# Patient Record
Sex: Female | Born: 1950
Health system: Southern US, Community
[De-identification: ages and names within clinical notes are randomized; demographics above are authoritative.]

## PROBLEM LIST (undated history)

## (undated) DIAGNOSIS — F419 Anxiety disorder, unspecified: Secondary | ICD-10-CM

## (undated) DIAGNOSIS — T4145XA Adverse effect of unspecified anesthetic, initial encounter: Secondary | ICD-10-CM

## (undated) DIAGNOSIS — K5792 Diverticulitis of intestine, part unspecified, without perforation or abscess without bleeding: Secondary | ICD-10-CM

## (undated) DIAGNOSIS — F32A Depression, unspecified: Secondary | ICD-10-CM

## (undated) DIAGNOSIS — I10 Essential (primary) hypertension: Secondary | ICD-10-CM

## (undated) DIAGNOSIS — F909 Attention-deficit hyperactivity disorder, unspecified type: Secondary | ICD-10-CM

## (undated) DIAGNOSIS — D649 Anemia, unspecified: Secondary | ICD-10-CM

## (undated) DIAGNOSIS — T8859XA Other complications of anesthesia, initial encounter: Secondary | ICD-10-CM

## (undated) DIAGNOSIS — R519 Headache, unspecified: Secondary | ICD-10-CM

## (undated) DIAGNOSIS — K839 Disease of biliary tract, unspecified: Secondary | ICD-10-CM

## (undated) DIAGNOSIS — E039 Hypothyroidism, unspecified: Secondary | ICD-10-CM

## (undated) DIAGNOSIS — M797 Fibromyalgia: Secondary | ICD-10-CM

## (undated) DIAGNOSIS — Z87442 Personal history of urinary calculi: Secondary | ICD-10-CM

## (undated) DIAGNOSIS — M1990 Unspecified osteoarthritis, unspecified site: Secondary | ICD-10-CM

## (undated) HISTORY — DX: Diverticulitis of intestine, part unspecified, without perforation or abscess without bleeding: K57.92

## (undated) HISTORY — PX: ABDOMINAL HYSTERECTOMY: SHX81

## (undated) HISTORY — PX: TONSILLECTOMY: SUR1361

## (undated) HISTORY — PX: MYOMECTOMY: SHX85

## (undated) HISTORY — PX: OTHER SURGICAL HISTORY: SHX169

## (undated) HISTORY — PX: APPENDECTOMY: SHX54

## (undated) HISTORY — PX: COLONOSCOPY: SHX174

## (undated) HISTORY — PX: CATARACT EXTRACTION W/ INTRAOCULAR LENS IMPLANT: SHX1309

---

## 1998-09-19 ENCOUNTER — Other Ambulatory Visit: Admission: RE | Admit: 1998-09-19 | Discharge: 1998-09-19 | Payer: Self-pay | Admitting: Obstetrics and Gynecology

## 1998-10-10 ENCOUNTER — Ambulatory Visit (HOSPITAL_COMMUNITY): Admission: RE | Admit: 1998-10-10 | Discharge: 1998-10-10 | Payer: Self-pay | Admitting: Obstetrics and Gynecology

## 1998-12-17 HISTORY — PX: ABDOMINAL HYSTERECTOMY: SHX81

## 1999-08-16 ENCOUNTER — Encounter (INDEPENDENT_AMBULATORY_CARE_PROVIDER_SITE_OTHER): Payer: Self-pay | Admitting: Specialist

## 1999-08-16 ENCOUNTER — Inpatient Hospital Stay (HOSPITAL_COMMUNITY): Admission: RE | Admit: 1999-08-16 | Discharge: 1999-08-18 | Payer: Self-pay | Admitting: Obstetrics and Gynecology

## 1999-12-18 HISTORY — PX: LUMBAR FUSION: SHX111

## 2000-07-04 ENCOUNTER — Other Ambulatory Visit: Admission: RE | Admit: 2000-07-04 | Discharge: 2000-07-04 | Payer: Self-pay | Admitting: Obstetrics and Gynecology

## 2001-07-08 ENCOUNTER — Other Ambulatory Visit: Admission: RE | Admit: 2001-07-08 | Discharge: 2001-07-08 | Payer: Self-pay | Admitting: Obstetrics and Gynecology

## 2003-03-09 ENCOUNTER — Other Ambulatory Visit: Admission: RE | Admit: 2003-03-09 | Discharge: 2003-03-09 | Payer: Self-pay | Admitting: Obstetrics and Gynecology

## 2006-03-27 ENCOUNTER — Ambulatory Visit: Payer: Self-pay

## 2006-04-10 ENCOUNTER — Ambulatory Visit: Payer: Self-pay

## 2006-05-22 ENCOUNTER — Ambulatory Visit: Payer: Self-pay

## 2007-01-22 ENCOUNTER — Ambulatory Visit: Payer: Self-pay

## 2007-02-05 ENCOUNTER — Ambulatory Visit: Payer: Self-pay

## 2007-03-11 ENCOUNTER — Ambulatory Visit: Payer: Self-pay

## 2007-03-18 ENCOUNTER — Ambulatory Visit: Payer: Self-pay

## 2011-06-21 ENCOUNTER — Encounter: Payer: Self-pay | Admitting: Family Medicine

## 2012-02-13 ENCOUNTER — Encounter (HOSPITAL_COMMUNITY): Payer: Self-pay | Admitting: Emergency Medicine

## 2012-02-13 ENCOUNTER — Emergency Department (HOSPITAL_COMMUNITY)

## 2012-02-13 ENCOUNTER — Emergency Department (HOSPITAL_COMMUNITY)
Admission: EM | Admit: 2012-02-13 | Discharge: 2012-02-13 | Disposition: A | Discharge status: Home / Self Care | Attending: Emergency Medicine | Admitting: Emergency Medicine

## 2012-02-13 ENCOUNTER — Other Ambulatory Visit: Payer: Self-pay | Admitting: Internal Medicine

## 2012-02-13 ENCOUNTER — Other Ambulatory Visit: Payer: Self-pay

## 2012-02-13 DIAGNOSIS — R7989 Other specified abnormal findings of blood chemistry: Secondary | ICD-10-CM | POA: Insufficient documentation

## 2012-02-13 DIAGNOSIS — F17200 Nicotine dependence, unspecified, uncomplicated: Secondary | ICD-10-CM | POA: Insufficient documentation

## 2012-02-13 DIAGNOSIS — N390 Urinary tract infection, site not specified: Secondary | ICD-10-CM

## 2012-02-13 DIAGNOSIS — R110 Nausea: Secondary | ICD-10-CM

## 2012-02-13 DIAGNOSIS — R197 Diarrhea, unspecified: Secondary | ICD-10-CM | POA: Insufficient documentation

## 2012-02-13 DIAGNOSIS — R109 Unspecified abdominal pain: Secondary | ICD-10-CM | POA: Insufficient documentation

## 2012-02-13 DIAGNOSIS — I10 Essential (primary) hypertension: Secondary | ICD-10-CM | POA: Insufficient documentation

## 2012-02-13 DIAGNOSIS — R112 Nausea with vomiting, unspecified: Secondary | ICD-10-CM | POA: Insufficient documentation

## 2012-02-13 HISTORY — DX: Essential (primary) hypertension: I10

## 2012-02-13 LAB — URINALYSIS, ROUTINE W REFLEX MICROSCOPIC
Bilirubin Urine: NEGATIVE
Glucose, UA: 100 mg/dL — AB
Ketones, ur: NEGATIVE mg/dL
Leukocytes, UA: NEGATIVE
Nitrite: NEGATIVE
Protein, ur: 100 mg/dL — AB
Specific Gravity, Urine: 1.016 (ref 1.005–1.030)
Urobilinogen, UA: 0.2 mg/dL (ref 0.0–1.0)
pH: 7 (ref 5.0–8.0)

## 2012-02-13 LAB — COMPREHENSIVE METABOLIC PANEL
ALT: 42 U/L — ABNORMAL HIGH (ref 0–35)
AST: 26 U/L (ref 0–37)
Albumin: 4.2 g/dL (ref 3.5–5.2)
Alkaline Phosphatase: 55 U/L (ref 39–117)
BUN: 11 mg/dL (ref 6–23)
CO2: 21 mEq/L (ref 19–32)
Calcium: 10.5 mg/dL (ref 8.4–10.5)
Chloride: 95 mEq/L — ABNORMAL LOW (ref 96–112)
Creatinine, Ser: 0.55 mg/dL (ref 0.50–1.10)
GFR calc Af Amer: >90 mL/min (ref >90)
GFR calc non Af Amer: >90 mL/min (ref >90)
Glucose, Bld: 162 mg/dL — ABNORMAL HIGH (ref 70–99)
Potassium: 3.9 mEq/L (ref 3.5–5.1)
Sodium: 135 mEq/L (ref 135–145)
Total Bilirubin: 0.4 mg/dL (ref 0.3–1.2)
Total Protein: 7.9 g/dL (ref 6.0–8.3)

## 2012-02-13 LAB — CBC
HCT: 45 % (ref 36.0–46.0)
Hemoglobin: 15.9 g/dL — ABNORMAL HIGH (ref 12.0–15.0)
MCH: 31.4 pg (ref 26.0–34.0)
MCHC: 35.3 g/dL (ref 30.0–36.0)
MCV: 88.9 fL (ref 78.0–100.0)
Platelets: 128 10*3/uL — ABNORMAL LOW (ref 150–400)
RBC: 5.06 MIL/uL (ref 3.87–5.11)
RDW: 12.8 % (ref 11.5–15.5)
WBC: 10.6 10*3/uL — ABNORMAL HIGH (ref 4.0–10.5)

## 2012-02-13 LAB — LIPASE, BLOOD: Lipase: 22 U/L (ref 11–59)

## 2012-02-13 LAB — DIFFERENTIAL
Basophils Absolute: 0 10*3/uL (ref 0.0–0.1)
Basophils Relative: 0 % (ref 0–1)
Eosinophils Absolute: 0 10*3/uL (ref 0.0–0.7)
Eosinophils Relative: 0 % (ref 0–5)
Lymphocytes Relative: 13 % (ref 12–46)
Lymphs Abs: 1.4 10*3/uL (ref 0.7–4.0)
Monocytes Absolute: 0.3 10*3/uL (ref 0.1–1.0)
Monocytes Relative: 3 % (ref 3–12)
Neutro Abs: 8.9 10*3/uL — ABNORMAL HIGH (ref 1.7–7.7)
Neutrophils Relative %: 84 % — ABNORMAL HIGH (ref 43–77)

## 2012-02-13 LAB — URINE MICROSCOPIC-ADD ON

## 2012-02-13 MED ORDER — HYDROMORPHONE HCL PF 1 MG/ML IJ SOLN
1.0000 mg | Freq: Once | INTRAMUSCULAR | Status: AC
  Administered 2012-02-13: 1 mg via INTRAVENOUS
  Filled 2012-02-13: qty 1

## 2012-02-13 MED ORDER — HYDROCODONE-ACETAMINOPHEN 5-500 MG PO TABS: 1.0000 | ORAL_TABLET | Freq: Four times a day (QID) | ORAL | Status: AC | PRN

## 2012-02-13 MED ORDER — CEPHALEXIN 500 MG PO CAPS: 1000.0000 mg | ORAL_CAPSULE | Freq: Two times a day (BID) | ORAL | Status: AC

## 2012-02-13 MED ORDER — HYDROMORPHONE HCL PF 1 MG/ML IJ SOLN
0.5000 mg | Freq: Once | INTRAMUSCULAR | Status: AC
  Administered 2012-02-13: 0.5 mg via INTRAVENOUS
  Filled 2012-02-13: qty 1

## 2012-02-13 MED ORDER — ONDANSETRON 8 MG PO TBDP: 8.0000 mg | ORAL_TABLET | Freq: Three times a day (TID) | ORAL | Status: AC | PRN

## 2012-02-13 MED ORDER — ONDANSETRON HCL 4 MG/2ML IJ SOLN
4.0000 mg | Freq: Once | INTRAMUSCULAR | Status: AC
  Administered 2012-02-13: 4 mg via INTRAVENOUS
  Filled 2012-02-13: qty 2

## 2012-02-13 MED ORDER — SODIUM CHLORIDE 0.9 % IV SOLN
Freq: Once | INTRAVENOUS | Status: AC
  Administered 2012-02-13: 17:00:00 via INTRAVENOUS

## 2012-02-13 NOTE — ED Notes (Signed)
Pt to u/s via wheelchair with u/s tech

## 2012-02-13 NOTE — ED Notes (Addendum)
Pt c/o epigastric pain since 7am. Pt also reports one episode of nausea and diarrhea. Pt was seen at PCP office and was told to come here to have u/s of gallbladder.

## 2012-02-13 NOTE — ED Provider Notes (Signed)
History     CSN: [id number]  Arrival date & time 02/13/12  1619   First MD Initiated Contact with Patient 02/13/12 1640      Chief Complaint  Patient presents with  . Abdominal Pain    (Consider location/radiation/quality/duration/timing/severity/associated sxs/prior treatment) Patient is a 61 y.o. female presenting with abdominal pain. The history is provided by the patient.  Abdominal Pain The primary symptoms of the illness include abdominal pain, nausea, vomiting and diarrhea. The primary symptoms of the illness do not include fever, shortness of breath, hematemesis, dysuria, vaginal discharge or vaginal bleeding. The current episode started 6 to 12 hours ago. The onset of the illness was sudden. The problem has been gradually worsening.  Additional symptoms associated with the illness include anorexia. Symptoms associated with the illness do not include chills or hematuria.  Pt states she woke up this morning with upper abdominal pain, nausea, vomiting, diarrhea. States tried pepto, pepcid, other over the counter products, however, was not getting better. Went to see her PCP. Got blood drawn and [place] scheduled for tomorrow. Pt states she cannot wait until tomorrow, so came straight here. Pt states pain is worsening, right now mainly in the right upper abdomen radiating into the back. No similar episodes in the past. Denies fever, chills, chest pain, SOB, urinary symptoms.   Past Medical History  Diagnosis Date  . Hypertension     Past Surgical History  Procedure Date  . Abdominal hysterectomy     History reviewed. No pertinent family history.  History  Substance Use Topics  . Smoking status: Current Everyday Smoker -- 0.5 packs/day  . Smokeless tobacco: Not on file  . Alcohol Use: No    OB History    Grav Para Term Preterm Abortions TAB SAB Ect Mult Living                  Review of Systems  Constitutional: Negative for fever and chills.  HENT: Negative.   Eyes:  Negative.   Respiratory: Negative for cough, chest tightness and shortness of breath.   Cardiovascular: Negative.   Gastrointestinal: Positive for nausea, vomiting, abdominal pain, diarrhea and anorexia. Negative for hematemesis.  Genitourinary: Negative for dysuria, hematuria, flank pain, vaginal bleeding, vaginal discharge and pelvic pain.  Musculoskeletal: Negative.   Skin: Negative.   Neurological: Negative.   Psychiatric/Behavioral: Negative.     Allergies  Review of patient's allergies indicates no known allergies.  Home Medications  No current outpatient prescriptions on file.  BP 193/111  Pulse 85  Temp(Src) 97.8 F (36.6 C) (Oral)  Resp 20  Ht 5\' 2"  (1.575 m)  Wt 193 lb (87.544 kg)  BMI 35.30 kg/m2  SpO2 100%  Physical Exam  Nursing note and vitals reviewed. Constitutional: She is oriented to person, place, and time. She appears well-developed and well-nourished.       Uncomfortable appearing.  HENT:  Head: Normocephalic and atraumatic.  Eyes: Conjunctivae are normal.  Neck: Neck supple.  Cardiovascular: Normal rate, regular rhythm and normal heart sounds.   Pulmonary/Chest: Effort normal and breath sounds normal. No respiratory distress.  Abdominal: Soft. Bowel sounds are normal. There is no rebound.       RUQ tenderness, guarding. Epigastric tenderness.  Musculoskeletal: She exhibits no edema.  Neurological: She is alert and oriented to person, place, and time.  Skin: Skin is warm and dry.  Psychiatric: She has a normal mood and affect.    ED Course  Procedures (including critical care time)  Date: 02/13/2012  Rate: 79  Rhythm: normal sinus rhythm  QRS Axis: normal  Intervals: normal  ST/T Wave abnormalities: nonspecific T wave changes  Conduction Disutrbances:none  Narrative Interpretation:   Old EKG Reviewed: unchanged compared to 08/11/99.  Pt with severe RUQ abdominal pain. Medications ordered. Labs ordered. [place] ordered to r/o cholecystitis.    Results for orders placed during the hospital encounter of 02/13/12  CBC      Component Value Range   WBC 10.6 (*) 4.0 - 10.5 (K/uL)   RBC 5.06  3.87 - 5.11 (MIL/uL)   Hemoglobin 15.9 (*) 12.0 - 15.0 (g/dL)   HCT 16.1  09.6 - 04.5 (%)   MCV 88.9  78.0 - 100.0 (fL)   MCH 31.4  26.0 - 34.0 (pg)   MCHC 35.3  30.0 - 36.0 (g/dL)   RDW 40.9  81.1 - 91.4 (%)   Platelets 128 (*) 150 - 400 (K/uL)  DIFFERENTIAL      Component Value Range   Neutrophils Relative 84 (*) 43 - 77 (%)   Lymphocytes Relative 13  12 - 46 (%)   Monocytes Relative 3  3 - 12 (%)   Eosinophils Relative 0  0 - 5 (%)   Basophils Relative 0  0 - 1 (%)   Neutro Abs 8.9 (*) 1.7 - 7.7 (K/uL)   Lymphs Abs 1.4  0.7 - 4.0 (K/uL)   Monocytes Absolute 0.3  0.1 - 1.0 (K/uL)   Eosinophils Absolute 0.0  0.0 - 0.7 (K/uL)   Basophils Absolute 0.0  0.0 - 0.1 (K/uL)   WBC Morphology ATYPICAL LYMPHOCYTES     Smear Review PLATELET COUNT CONFIRMED BY SMEAR    COMPREHENSIVE METABOLIC PANEL      Component Value Range   Sodium 135  135 - 145 (mEq/L)   Potassium 3.9  3.5 - 5.1 (mEq/L)   Chloride 95 (*) 96 - 112 (mEq/L)   CO2 21  19 - 32 (mEq/L)   Glucose, Bld 162 (*) 70 - 99 (mg/dL)   BUN 11  6 - 23 (mg/dL)   Creatinine, Ser 7.82  0.50 - 1.10 (mg/dL)   Calcium 95.6  8.4 - 10.5 (mg/dL)   Total Protein 7.9  6.0 - 8.3 (g/dL)   Albumin 4.2  3.5 - 5.2 (g/dL)   AST 26  0 - 37 (U/L)   ALT 42 (*) 0 - 35 (U/L)   Alkaline Phosphatase 55  39 - 117 (U/L)   Total Bilirubin 0.4  0.3 - 1.2 (mg/dL)   GFR calc non Af Amer >90  >90 (mL/min)   GFR calc Af Amer >90  >90 (mL/min)  LIPASE, BLOOD      Component Value Range   Lipase 22  11 - 59 (U/L)  URINALYSIS, ROUTINE W REFLEX MICROSCOPIC      Component Value Range   Color, Urine YELLOW  YELLOW    APPearance CLOUDY (*) CLEAR    Specific Gravity, Urine 1.016  1.005 - 1.030    pH 7.0  5.0 - 8.0    Glucose, UA 100 (*) NEGATIVE (mg/dL)   Hgb urine dipstick LARGE (*) NEGATIVE    Bilirubin Urine  NEGATIVE  NEGATIVE    Ketones, ur NEGATIVE  NEGATIVE (mg/dL)   Protein, ur 213 (*) NEGATIVE (mg/dL)   Urobilinogen, UA 0.2  0.0 - 1.0 (mg/dL)   Nitrite NEGATIVE  NEGATIVE    Leukocytes, UA NEGATIVE  NEGATIVE   URINE MICROSCOPIC-ADD ON      Component Value  Range   RBC / HPF 21-50  <3 (RBC/hpf)   Bacteria, UA MANY (*) RARE    US Abdomen Complete  02/13/2012  *RADIOLOGY REPORT*  Clinical Data:  Elevated liver function tests, abdominal pain and nausea.  ABDOMEN ULTRASOUND  Technique:  Complete abdominal ultrasound examination was performed including evaluation of the liver, gallbladder, bile ducts, pancreas, kidneys, spleen, IVC, and abdominal aorta.  Comparison:  None  Findings:  Gallbladder:  No shadowing gallstones or echogenic sludge.  No gallbladder wall thickening or pericholecystic fluid.  Negative sonographic Murphy's sign according to the ultrasound technologist.  Common Bile Duct:  Normal caliber of 2 mm.  Liver:  The liver shows diffusely increased echogenicity likely reflecting steatosis.  There is no evidence of obvious focal mass or biliary dilatation by ultrasound.  IVC:  Patent throughout its visualized course in the abdomen.  Pancreas:  Although the pancreas is difficult to visualize in its entirety, no focal pancreatic abnormality is identified.  Spleen:  Normal echotexture and size.  Kidneys:  The right kidney measures 11.1 cm and the left kidney 11.9 cm.  Both have a normal sonographic appearance.  There is no evidence of renal obstruction or lesion.  Abdominal Aorta:  No evidence of aortic aneurysm.  IMPRESSION: Diffuse increased echogenicity of the liver likely reflecting steatosis.  Original Report Authenticated By: Reola Calkins, M.D.    [place] negative. Pt feeling better. Pain free. No nausea/vomiting in ED. Abdomen soft. Will d/c home with pain meds and close follow up. Pt does have a UTI, however having no CVA tenderness. Will start on keflex. Results and plan reviewed with her  and her husband. She agrees with the plan. Will call her PCP in AM, return if worsening.    No diagnosis found.    MDM          Lottie Mussel, PA 02/14/12 0030

## 2012-02-14 ENCOUNTER — Other Ambulatory Visit: Payer: Self-pay

## 2012-02-14 NOTE — ED Provider Notes (Signed)
Medical screening examination/treatment/procedure(s) were performed by non-physician practitioner and as supervising physician I was immediately available for consultation/collaboration.    Betzayda Braxton L Alpa Salvo, MD 02/14/12 1633 

## 2012-02-15 LAB — URINE CULTURE
Colony Count: 35000
Culture  Setup Time: 201302280516

## 2012-02-27 ENCOUNTER — Ambulatory Visit (INDEPENDENT_AMBULATORY_CARE_PROVIDER_SITE_OTHER): Payer: Self-pay

## 2012-02-27 DIAGNOSIS — F4320 Adjustment disorder, unspecified: Secondary | ICD-10-CM

## 2012-03-05 ENCOUNTER — Ambulatory Visit (INDEPENDENT_AMBULATORY_CARE_PROVIDER_SITE_OTHER): Payer: Self-pay

## 2012-03-05 DIAGNOSIS — F4320 Adjustment disorder, unspecified: Secondary | ICD-10-CM

## 2012-03-12 ENCOUNTER — Ambulatory Visit (INDEPENDENT_AMBULATORY_CARE_PROVIDER_SITE_OTHER): Payer: Self-pay

## 2012-03-12 DIAGNOSIS — F4320 Adjustment disorder, unspecified: Secondary | ICD-10-CM

## 2012-03-26 ENCOUNTER — Ambulatory Visit (INDEPENDENT_AMBULATORY_CARE_PROVIDER_SITE_OTHER): Payer: Self-pay

## 2012-03-26 DIAGNOSIS — F4320 Adjustment disorder, unspecified: Secondary | ICD-10-CM

## 2012-04-02 ENCOUNTER — Ambulatory Visit (INDEPENDENT_AMBULATORY_CARE_PROVIDER_SITE_OTHER): Payer: Self-pay

## 2012-04-02 DIAGNOSIS — F4320 Adjustment disorder, unspecified: Secondary | ICD-10-CM

## 2012-04-03 ENCOUNTER — Encounter (HOSPITAL_COMMUNITY): Payer: Self-pay | Admitting: Emergency Medicine

## 2012-04-03 ENCOUNTER — Emergency Department (HOSPITAL_COMMUNITY)

## 2012-04-03 ENCOUNTER — Emergency Department (HOSPITAL_COMMUNITY)
Admission: EM | Admit: 2012-04-03 | Discharge: 2012-04-04 | Disposition: A | Discharge status: Home / Self Care | Attending: Emergency Medicine | Admitting: Emergency Medicine

## 2012-04-03 ENCOUNTER — Other Ambulatory Visit: Payer: Self-pay

## 2012-04-03 DIAGNOSIS — I10 Essential (primary) hypertension: Secondary | ICD-10-CM | POA: Insufficient documentation

## 2012-04-03 DIAGNOSIS — I776 Arteritis, unspecified: Secondary | ICD-10-CM

## 2012-04-03 DIAGNOSIS — R142 Eructation: Secondary | ICD-10-CM | POA: Insufficient documentation

## 2012-04-03 DIAGNOSIS — R109 Unspecified abdominal pain: Secondary | ICD-10-CM | POA: Insufficient documentation

## 2012-04-03 DIAGNOSIS — M549 Dorsalgia, unspecified: Secondary | ICD-10-CM | POA: Insufficient documentation

## 2012-04-03 DIAGNOSIS — Z79899 Other long term (current) drug therapy: Secondary | ICD-10-CM | POA: Insufficient documentation

## 2012-04-03 DIAGNOSIS — R141 Gas pain: Secondary | ICD-10-CM | POA: Insufficient documentation

## 2012-04-03 DIAGNOSIS — R112 Nausea with vomiting, unspecified: Secondary | ICD-10-CM | POA: Insufficient documentation

## 2012-04-03 HISTORY — DX: Anxiety disorder, unspecified: F41.9

## 2012-04-03 LAB — COMPREHENSIVE METABOLIC PANEL
ALT: 33 U/L (ref 0–35)
AST: 24 U/L (ref 0–37)
Albumin: 4.3 g/dL (ref 3.5–5.2)
Alkaline Phosphatase: 60 U/L (ref 39–117)
BUN: 15 mg/dL (ref 6–23)
CO2: 23 mEq/L (ref 19–32)
Calcium: 9.9 mg/dL (ref 8.4–10.5)
Chloride: 101 mEq/L (ref 96–112)
Creatinine, Ser: 0.69 mg/dL (ref 0.50–1.10)
GFR calc Af Amer: >90 mL/min (ref >90)
GFR calc non Af Amer: >90 mL/min (ref >90)
Glucose, Bld: 157 mg/dL — ABNORMAL HIGH (ref 70–99)
Potassium: 3.9 mEq/L (ref 3.5–5.1)
Sodium: 139 mEq/L (ref 135–145)
Total Bilirubin: 0.3 mg/dL (ref 0.3–1.2)
Total Protein: 8.1 g/dL (ref 6.0–8.3)

## 2012-04-03 LAB — DIFFERENTIAL
Basophils Absolute: 0 10*3/uL (ref 0.0–0.1)
Basophils Relative: 0 % (ref 0–1)
Eosinophils Absolute: 0 10*3/uL (ref 0.0–0.7)
Eosinophils Relative: 0 % (ref 0–5)
Lymphocytes Relative: 11 % — ABNORMAL LOW (ref 12–46)
Lymphs Abs: 1.4 10*3/uL (ref 0.7–4.0)
Monocytes Absolute: 0.5 10*3/uL (ref 0.1–1.0)
Monocytes Relative: 4 % (ref 3–12)
Neutro Abs: 11 10*3/uL — ABNORMAL HIGH (ref 1.7–7.7)
Neutrophils Relative %: 85 % — ABNORMAL HIGH (ref 43–77)

## 2012-04-03 LAB — CBC
HCT: 43.5 % (ref 36.0–46.0)
Hemoglobin: 15.3 g/dL — ABNORMAL HIGH (ref 12.0–15.0)
MCH: 31.6 pg (ref 26.0–34.0)
MCHC: 35.2 g/dL (ref 30.0–36.0)
MCV: 89.9 fL (ref 78.0–100.0)
Platelets: 288 10*3/uL (ref 150–400)
RBC: 4.84 MIL/uL (ref 3.87–5.11)
RDW: 12.8 % (ref 11.5–15.5)
WBC: 13 10*3/uL — ABNORMAL HIGH (ref 4.0–10.5)

## 2012-04-03 LAB — LIPASE, BLOOD: Lipase: 30 U/L (ref 11–59)

## 2012-04-03 MED ORDER — PROMETHAZINE HCL 25 MG/ML IJ SOLN
12.5000 mg | INTRAMUSCULAR | Status: AC
Start: 2012-04-03 — End: 2012-04-03
  Administered 2012-04-03: 12.5 mg via INTRAVENOUS
  Filled 2012-04-03: qty 1

## 2012-04-03 MED ORDER — IOHEXOL 300 MG/ML  SOLN
100.0000 mL | Freq: Once | INTRAMUSCULAR | Status: AC | PRN
  Administered 2012-04-03: 100 mL via INTRAVENOUS

## 2012-04-03 MED ORDER — ONDANSETRON HCL 4 MG/2ML IJ SOLN
4.0000 mg | Freq: Once | INTRAMUSCULAR | Status: AC
  Administered 2012-04-03: 4 mg via INTRAVENOUS
  Filled 2012-04-03: qty 2

## 2012-04-03 MED ORDER — SODIUM CHLORIDE 0.9 % IV SOLN
Freq: Once | INTRAVENOUS | Status: AC
  Administered 2012-04-03: 21:00:00 via INTRAVENOUS

## 2012-04-03 MED ORDER — OXYCODONE-ACETAMINOPHEN 5-325 MG PO TABS: 1.0000 | ORAL_TABLET | ORAL | Status: AC | PRN

## 2012-04-03 MED ORDER — ONDANSETRON 8 MG PO TBDP: 8.0000 mg | ORAL_TABLET | Freq: Three times a day (TID) | ORAL | Status: AC | PRN

## 2012-04-03 MED ORDER — HYOSCYAMINE SULFATE 0.125 MG SL SUBL: 0.1250 mg | SUBLINGUAL_TABLET | SUBLINGUAL | Status: DC | PRN

## 2012-04-03 MED ORDER — HYDROMORPHONE HCL PF 2 MG/ML IJ SOLN
2.0000 mg | Freq: Once | INTRAMUSCULAR | Status: AC
  Administered 2012-04-03: 2 mg via INTRAVENOUS
  Filled 2012-04-03: qty 1

## 2012-04-03 MED ORDER — HYDROMORPHONE HCL PF 1 MG/ML IJ SOLN
0.5000 mg | Freq: Once | INTRAMUSCULAR | Status: AC
  Administered 2012-04-03: 0.5 mg via INTRAVENOUS
  Filled 2012-04-03: qty 1

## 2012-04-03 MED ORDER — HYOSCYAMINE SULFATE 0.5 MG/ML IJ SOLN
0.1250 mg | INTRAMUSCULAR | Status: AC
  Administered 2012-04-03: 0.125 mg via INTRAVENOUS
  Filled 2012-04-03: qty 0.25

## 2012-04-03 MED ORDER — HYDROMORPHONE HCL PF 1 MG/ML IJ SOLN
1.0000 mg | Freq: Once | INTRAMUSCULAR | Status: AC
  Administered 2012-04-03: 1 mg via INTRAVENOUS
  Filled 2012-04-03: qty 1

## 2012-04-03 NOTE — ED Provider Notes (Signed)
Medical screening examination/treatment/procedure(s) were conducted as a shared visit with non-physician practitioner(s) and myself.  I personally evaluated the patient during the encounter Pt had eaten lunch with clients.  When she was driving home,  She became light headed, developed abd pain and felt as if she would faint.  Now has had epigastric pain that goes  To back along with n/v/diarrhea.  Pain severe. No recent etoh or hx of pud.   No fever, resp sxs or uti sxs.  pe in pain,  Epigastric and ruq , luq ttp. No rigidity.  Will give analgesics and do labs and ct.  Has hx neg [place] for gall stones.   ED ECG REPORT     ECG ECG time 2122 Heart rate 58 Sinus bradycardia Normal axis Normal intervals, normal ST-T wave Interpretation sinus bradycardia        Cheri Guppy, MD 04/03/12 2203

## 2012-04-03 NOTE — ED Notes (Signed)
Lab tech at bedside

## 2012-04-03 NOTE — ED Notes (Signed)
MD at bedside. Dr. Caporossi at bedside.  

## 2012-04-03 NOTE — ED Notes (Signed)
Patient returned from CT

## 2012-04-03 NOTE — ED Notes (Signed)
PA Narvaez at bedside.  

## 2012-04-03 NOTE — ED Provider Notes (Signed)
History     CSN: [id number]  Arrival date & time 04/03/12  2010   First MD Initiated Contact with Patient 04/03/12 2025      Chief Complaint  Patient presents with  . Abdominal Pain    (Consider location/radiation/quality/duration/timing/severity/associated sxs/prior treatment) Patient is a 61 y.o. female presenting with abdominal pain. The history is provided by the patient and the spouse.  Abdominal Pain The primary symptoms of the illness include abdominal pain, nausea and vomiting. The primary symptoms of the illness do not include fever. The current episode started 3 to 5 hours ago. The onset of the illness was sudden. The problem has not changed since onset. The abdominal pain is located in the LUQ, RUQ and periumbilical region. The abdominal pain radiates to the back. The severity of the abdominal pain is 10/10.  Additional symptoms associated with the illness include back pain. Symptoms associated with the illness do not include chills. Associated symptoms comments: She was seen in ED on 02/13/12 for similar but less intense symptoms. She reports a negative ultrasound and work up, and symptom resolution with pain medications given in ED. She has been pain free since that time..    Past Medical History  Diagnosis Date  . Hypertension   . Anxiety     Past Surgical History  Procedure Date  . Abdominal hysterectomy     History reviewed. No pertinent family history.  History  Substance Use Topics  . Smoking status: Current Everyday Smoker -- 0.5 packs/day  . Smokeless tobacco: Not on file  . Alcohol Use: No    OB History    Grav Para Term Preterm Abortions TAB SAB Ect Mult Living                  Review of Systems  Constitutional: Negative for fever and chills.  HENT: Negative.   Respiratory: Negative.   Cardiovascular: Negative.   Gastrointestinal: Positive for nausea, vomiting and abdominal pain.  Musculoskeletal: Positive for back pain.  Skin: Negative.     Neurological: Negative.     Allergies  Review of patient's allergies indicates no known allergies.  Home Medications   Current Outpatient Rx  Name Route Sig Dispense Refill  . ALPRAZOLAM 1 MG PO TABS Oral Take by mouth See admin instructions. Take 1/2 to 1 tablet twice dailly as needed for anxiety.    . ESCITALOPRAM OXALATE 10 MG PO TABS Oral Take 10 mg by mouth daily.    Marland Kitchen LISINOPRIL 20 MG PO TABS Oral Take 20 mg by mouth daily.    Marland Kitchen ZOLPIDEM TARTRATE 10 MG PO TABS Oral Take 10 mg by mouth at bedtime as needed. Sleep.      BP 216/98  Pulse 66  Temp(Src) 97.5 F (36.4 C) (Oral)  Resp 25  Wt 175 lb (79.379 kg)  SpO2 100%  Physical Exam  Constitutional: She appears well-developed and well-nourished.       Patient is significantly uncomfortable appearing.   HENT:  Head: Normocephalic.  Neck: Normal range of motion. Neck supple.  Cardiovascular: Normal rate and regular rhythm.   Pulmonary/Chest: Effort normal and breath sounds normal.  Abdominal: Soft. She exhibits distension. She exhibits no mass. There is tenderness. There is no rebound and no guarding.       Tender across upper abdomen and in periumbilical region.   Musculoskeletal: Normal range of motion.  Neurological: She is alert. No cranial nerve deficit.  Skin: Skin is warm and dry. No rash noted.  Psychiatric:  She has a normal mood and affect.    ED Course  Procedures (including critical care time)   Labs Reviewed  CBC  DIFFERENTIAL  COMPREHENSIVE METABOLIC PANEL  LIPASE, BLOOD   No results found. Ct Abdomen Pelvis W Contrast  04/03/2012  *RADIOLOGY REPORT*  Clinical Data: Mid abdominal pain.  Recurring.  CT ABDOMEN AND PELVIS WITH CONTRAST  Technique:  Multidetector CT imaging of the abdomen and pelvis was performed following the standard protocol during bolus administration of intravenous contrast.  Contrast: OMNIPAQUE IOHEXOL 300 MG/ML  SOLN  Comparison: Abdominal ultrasound 02/13/2012  Findings:  The lung bases are clear. Coronary artery atherosclerotic calcification is seen in the visualized coronary arteries.  There is some oral contrast in the lumen of the distal thoracic esophagus.  There is moderate hepatic steatosis.  6 mm low density lesion left hepatic lobe is too small to definitely characterize but likely a cyst.  4 mm hypodensity in the right hepatic lobe is also too small to characterize but statistically likely a cyst.  There are multiple small calcifications in the spleen suggesting prior granulomas disease.  The spleen is normal in size.  The adrenal glands, pancreas, common bile duct, adrenal glands, and right kidney are within normal limits.  There is a 15 mm cyst in the superior pole left kidney.  There is no hydronephrosis.  Both ureters are normal in caliber and the urinary bladder is normal.  Stomach is within normal limits.  The duodenum and jejunum appear within normal limits.  There is fecalization of some of the distal ileal loops.  There are multiple scattered colonic diverticula. The sigmoid colon is fairly decompressed.  No discrete bowel wall thickening is identified.  There is slight stranding in the fat adjacent to the proximal superior mesenteric artery, seen on image #27 of the axial portion of the study and #38 of the sagittal images.  At this level, the proximal SMA wall appears slightly thickening, with slight narrowing of the lumen. This abnormality spans approximately 1.5 cm.  Distally, the superior mesenteric artery is normal in wall thickness in caliber. Distally, the fat planes of the superior mesenteric artery are normal.  Patient is status post hysterectomy.  No adnexal mass.  Negative for abdominal or pelvic lymphadenopathy.  There is no ascites or free air.  IMPRESSION:  1. Mesenteric fat stranding surrounding the proximal superior mesenteric artery with slight focal wall thickening of the superior mesenteric artery in this region. This finding raises the  possibility of focal vasculitis. Early / mild changes of mesenteritis is a less likely consideration. 2.  Fatty infiltration of the liver. 3.  Fecalization of distal small bowel loops.  This can be seen in the setting of chronic constipation or chronic low grade small bowel obstruction.  There are no features to suggest a bowel obstruction at this time.  Original Report Authenticated By: Britta Mccreedy, M.D.  Results for orders placed during the hospital encounter of 04/03/12  CBC      Component Value Range   WBC 13.0 (*) 4.0 - 10.5 (K/uL)   RBC 4.84  3.87 - 5.11 (MIL/uL)   Hemoglobin 15.3 (*) 12.0 - 15.0 (g/dL)   HCT 16.1  09.6 - 04.5 (%)   MCV 89.9  78.0 - 100.0 (fL)   MCH 31.6  26.0 - 34.0 (pg)   MCHC 35.2  30.0 - 36.0 (g/dL)   RDW 40.9  81.1 - 91.4 (%)   Platelets 288  150 - 400 (K/uL)  DIFFERENTIAL      Component Value Range   Neutrophils Relative 85 (*) 43 - 77 (%)   Neutro Abs 11.0 (*) 1.7 - 7.7 (K/uL)   Lymphocytes Relative 11 (*) 12 - 46 (%)   Lymphs Abs 1.4  0.7 - 4.0 (K/uL)   Monocytes Relative 4  3 - 12 (%)   Monocytes Absolute 0.5  0.1 - 1.0 (K/uL)   Eosinophils Relative 0  0 - 5 (%)   Eosinophils Absolute 0.0  0.0 - 0.7 (K/uL)   Basophils Relative 0  0 - 1 (%)   Basophils Absolute 0.0  0.0 - 0.1 (K/uL)  COMPREHENSIVE METABOLIC PANEL      Component Value Range   Sodium 139  135 - 145 (mEq/L)   Potassium 3.9  3.5 - 5.1 (mEq/L)   Chloride 101  96 - 112 (mEq/L)   CO2 23  19 - 32 (mEq/L)   Glucose, Bld 157 (*) 70 - 99 (mg/dL)   BUN 15  6 - 23 (mg/dL)   Creatinine, Ser 1.61  0.50 - 1.10 (mg/dL)   Calcium 9.9  8.4 - 09.6 (mg/dL)   Total Protein 8.1  6.0 - 8.3 (g/dL)   Albumin 4.3  3.5 - 5.2 (g/dL)   AST 24  0 - 37 (U/L)   ALT 33  0 - 35 (U/L)   Alkaline Phosphatase 60  39 - 117 (U/L)   Total Bilirubin 0.3  0.3 - 1.2 (mg/dL)   GFR calc non Af Amer >90  >90 (mL/min)   GFR calc Af Amer >90  >90 (mL/min)  LIPASE, BLOOD      Component Value Range   Lipase 30  11 - 59  (U/L)    No diagnosis found. 1. Abdominal pain 2. Mesenteric vasculitis    MDM  Pain is completely gone on re-examination. Patient comfortable. CT results showing ? Mesenteric vasculitis without ishcemia. No obstruction. Patient pain free now, and has primary care follow up as well as GI follow up in the community. She is comfortable with discharge home.         Rodena Medin, PA-C 04/03/12 2353

## 2012-04-03 NOTE — Discharge Instructions (Signed)
FOLLOW UP WITH DR. Kevan Ny AND WITH DR. Juanda Chance FOR FURTHER EVALUATION OF RECURRENT ABDOMINAL PAIN. YOUR CT AND LAB TESTS WILL BE AVAILABLE TO BOTH. TAKE MEDICATIONS AS NEEDED. RETURN HERE WITH ANY RECURRENT PAIN, HIGH FEVER, OR NEW CONCERN.

## 2012-04-03 NOTE — ED Notes (Signed)
Pt presented to teh ER with c/o abd pain, 10/10 at this time, pt has heard time to hold the whole sentence dure to pain, pt noted to be moaning and guarding the abd

## 2012-04-04 NOTE — ED Provider Notes (Signed)
Medical screening examination/treatment/procedure(s) were performed by non-physician practitioner and as supervising physician I was immediately available for consultation/collaboration.  Cheri Guppy, MD 04/04/12 [phone]

## 2012-04-09 ENCOUNTER — Ambulatory Visit (INDEPENDENT_AMBULATORY_CARE_PROVIDER_SITE_OTHER): Payer: Self-pay

## 2012-04-09 DIAGNOSIS — F4320 Adjustment disorder, unspecified: Secondary | ICD-10-CM

## 2012-04-16 ENCOUNTER — Ambulatory Visit (INDEPENDENT_AMBULATORY_CARE_PROVIDER_SITE_OTHER): Payer: Self-pay

## 2012-04-16 DIAGNOSIS — F4320 Adjustment disorder, unspecified: Secondary | ICD-10-CM

## 2012-04-23 ENCOUNTER — Ambulatory Visit (INDEPENDENT_AMBULATORY_CARE_PROVIDER_SITE_OTHER): Payer: Self-pay

## 2012-04-23 DIAGNOSIS — F4320 Adjustment disorder, unspecified: Secondary | ICD-10-CM

## 2012-04-29 ENCOUNTER — Ambulatory Visit (INDEPENDENT_AMBULATORY_CARE_PROVIDER_SITE_OTHER): Payer: Self-pay

## 2012-04-29 DIAGNOSIS — F4320 Adjustment disorder, unspecified: Secondary | ICD-10-CM

## 2012-05-07 ENCOUNTER — Ambulatory Visit (INDEPENDENT_AMBULATORY_CARE_PROVIDER_SITE_OTHER): Payer: Self-pay

## 2012-05-07 DIAGNOSIS — F4320 Adjustment disorder, unspecified: Secondary | ICD-10-CM

## 2012-05-13 ENCOUNTER — Ambulatory Visit

## 2012-05-14 ENCOUNTER — Ambulatory Visit (INDEPENDENT_AMBULATORY_CARE_PROVIDER_SITE_OTHER): Payer: Self-pay

## 2012-05-14 DIAGNOSIS — F4320 Adjustment disorder, unspecified: Secondary | ICD-10-CM

## 2012-05-21 ENCOUNTER — Ambulatory Visit (INDEPENDENT_AMBULATORY_CARE_PROVIDER_SITE_OTHER): Payer: Self-pay

## 2012-05-21 DIAGNOSIS — F4320 Adjustment disorder, unspecified: Secondary | ICD-10-CM

## 2012-05-28 ENCOUNTER — Ambulatory Visit

## 2012-06-11 ENCOUNTER — Ambulatory Visit (INDEPENDENT_AMBULATORY_CARE_PROVIDER_SITE_OTHER)

## 2012-06-11 DIAGNOSIS — F4320 Adjustment disorder, unspecified: Secondary | ICD-10-CM

## 2012-06-25 ENCOUNTER — Ambulatory Visit (INDEPENDENT_AMBULATORY_CARE_PROVIDER_SITE_OTHER): Payer: Self-pay

## 2012-06-25 DIAGNOSIS — F4320 Adjustment disorder, unspecified: Secondary | ICD-10-CM

## 2012-07-02 ENCOUNTER — Ambulatory Visit

## 2012-07-16 ENCOUNTER — Ambulatory Visit (INDEPENDENT_AMBULATORY_CARE_PROVIDER_SITE_OTHER): Payer: Self-pay

## 2012-07-16 DIAGNOSIS — F4320 Adjustment disorder, unspecified: Secondary | ICD-10-CM

## 2012-07-22 ENCOUNTER — Ambulatory Visit (INDEPENDENT_AMBULATORY_CARE_PROVIDER_SITE_OTHER): Payer: Self-pay

## 2012-07-22 DIAGNOSIS — F4320 Adjustment disorder, unspecified: Secondary | ICD-10-CM

## 2012-07-30 ENCOUNTER — Ambulatory Visit

## 2012-08-06 ENCOUNTER — Ambulatory Visit (INDEPENDENT_AMBULATORY_CARE_PROVIDER_SITE_OTHER): Payer: Self-pay

## 2012-08-06 DIAGNOSIS — F4320 Adjustment disorder, unspecified: Secondary | ICD-10-CM

## 2012-08-13 ENCOUNTER — Ambulatory Visit (INDEPENDENT_AMBULATORY_CARE_PROVIDER_SITE_OTHER): Payer: Self-pay

## 2012-08-13 DIAGNOSIS — F4320 Adjustment disorder, unspecified: Secondary | ICD-10-CM

## 2012-08-15 ENCOUNTER — Other Ambulatory Visit (HOSPITAL_COMMUNITY): Payer: Self-pay | Admitting: Internal Medicine

## 2012-08-15 DIAGNOSIS — R109 Unspecified abdominal pain: Secondary | ICD-10-CM

## 2012-08-21 ENCOUNTER — Encounter (HOSPITAL_COMMUNITY)
Admission: RE | Admit: 2012-08-21 | Discharge: 2012-08-21 | Disposition: A | Discharge status: Home / Self Care | Source: Ambulatory Visit | Attending: Internal Medicine | Admitting: Internal Medicine

## 2012-08-21 ENCOUNTER — Encounter (HOSPITAL_COMMUNITY): Payer: Self-pay

## 2012-08-21 DIAGNOSIS — R109 Unspecified abdominal pain: Secondary | ICD-10-CM | POA: Insufficient documentation

## 2012-08-21 MED ORDER — TECHNETIUM TC 99M MEBROFENIN IV KIT
5.5000 | PACK | Freq: Once | INTRAVENOUS | Status: AC | PRN
  Administered 2012-08-21: 5.5 via INTRAVENOUS

## 2012-08-22 ENCOUNTER — Other Ambulatory Visit (HOSPITAL_COMMUNITY): Payer: Self-pay | Admitting: Internal Medicine

## 2012-08-22 DIAGNOSIS — R109 Unspecified abdominal pain: Secondary | ICD-10-CM

## 2012-08-27 ENCOUNTER — Ambulatory Visit (INDEPENDENT_AMBULATORY_CARE_PROVIDER_SITE_OTHER): Payer: Self-pay

## 2012-08-27 DIAGNOSIS — F4320 Adjustment disorder, unspecified: Secondary | ICD-10-CM

## 2012-09-03 ENCOUNTER — Ambulatory Visit: Payer: Self-pay

## 2012-09-05 ENCOUNTER — Encounter (INDEPENDENT_AMBULATORY_CARE_PROVIDER_SITE_OTHER): Payer: Self-pay | Admitting: General Surgery

## 2012-09-09 ENCOUNTER — Ambulatory Visit (INDEPENDENT_AMBULATORY_CARE_PROVIDER_SITE_OTHER): Admitting: General Surgery

## 2012-09-10 ENCOUNTER — Ambulatory Visit (INDEPENDENT_AMBULATORY_CARE_PROVIDER_SITE_OTHER): Payer: Self-pay

## 2012-09-10 DIAGNOSIS — F4320 Adjustment disorder, unspecified: Secondary | ICD-10-CM

## 2012-09-16 ENCOUNTER — Encounter (INDEPENDENT_AMBULATORY_CARE_PROVIDER_SITE_OTHER): Payer: Self-pay | Admitting: General Surgery

## 2012-09-17 ENCOUNTER — Ambulatory Visit (INDEPENDENT_AMBULATORY_CARE_PROVIDER_SITE_OTHER): Payer: Self-pay

## 2012-09-17 DIAGNOSIS — F4320 Adjustment disorder, unspecified: Secondary | ICD-10-CM

## 2012-09-24 ENCOUNTER — Ambulatory Visit (INDEPENDENT_AMBULATORY_CARE_PROVIDER_SITE_OTHER): Payer: Self-pay

## 2012-09-24 DIAGNOSIS — F4320 Adjustment disorder, unspecified: Secondary | ICD-10-CM

## 2012-10-01 ENCOUNTER — Ambulatory Visit (INDEPENDENT_AMBULATORY_CARE_PROVIDER_SITE_OTHER): Payer: Self-pay

## 2012-10-01 DIAGNOSIS — F4320 Adjustment disorder, unspecified: Secondary | ICD-10-CM

## 2012-10-22 ENCOUNTER — Ambulatory Visit (INDEPENDENT_AMBULATORY_CARE_PROVIDER_SITE_OTHER): Payer: Self-pay

## 2012-10-22 DIAGNOSIS — F4320 Adjustment disorder, unspecified: Secondary | ICD-10-CM

## 2012-10-29 ENCOUNTER — Ambulatory Visit: Payer: Self-pay

## 2012-11-05 ENCOUNTER — Ambulatory Visit (INDEPENDENT_AMBULATORY_CARE_PROVIDER_SITE_OTHER): Payer: Self-pay

## 2012-11-05 DIAGNOSIS — F4320 Adjustment disorder, unspecified: Secondary | ICD-10-CM

## 2012-11-19 ENCOUNTER — Ambulatory Visit (INDEPENDENT_AMBULATORY_CARE_PROVIDER_SITE_OTHER): Payer: Self-pay

## 2012-11-19 DIAGNOSIS — F4320 Adjustment disorder, unspecified: Secondary | ICD-10-CM

## 2012-12-03 ENCOUNTER — Ambulatory Visit (INDEPENDENT_AMBULATORY_CARE_PROVIDER_SITE_OTHER): Payer: Self-pay

## 2012-12-03 DIAGNOSIS — F4320 Adjustment disorder, unspecified: Secondary | ICD-10-CM

## 2012-12-23 ENCOUNTER — Ambulatory Visit (INDEPENDENT_AMBULATORY_CARE_PROVIDER_SITE_OTHER): Payer: Self-pay

## 2012-12-23 DIAGNOSIS — F4320 Adjustment disorder, unspecified: Secondary | ICD-10-CM

## 2013-01-21 ENCOUNTER — Ambulatory Visit (INDEPENDENT_AMBULATORY_CARE_PROVIDER_SITE_OTHER): Payer: Self-pay

## 2013-01-21 DIAGNOSIS — F4320 Adjustment disorder, unspecified: Secondary | ICD-10-CM

## 2013-02-03 ENCOUNTER — Ambulatory Visit (INDEPENDENT_AMBULATORY_CARE_PROVIDER_SITE_OTHER): Payer: Self-pay

## 2013-02-03 DIAGNOSIS — F4320 Adjustment disorder, unspecified: Secondary | ICD-10-CM

## 2013-02-11 ENCOUNTER — Ambulatory Visit (INDEPENDENT_AMBULATORY_CARE_PROVIDER_SITE_OTHER): Payer: Self-pay

## 2013-02-11 DIAGNOSIS — F4320 Adjustment disorder, unspecified: Secondary | ICD-10-CM

## 2013-02-18 ENCOUNTER — Ambulatory Visit (INDEPENDENT_AMBULATORY_CARE_PROVIDER_SITE_OTHER): Payer: Self-pay

## 2013-02-18 DIAGNOSIS — F4320 Adjustment disorder, unspecified: Secondary | ICD-10-CM

## 2013-02-25 ENCOUNTER — Ambulatory Visit (INDEPENDENT_AMBULATORY_CARE_PROVIDER_SITE_OTHER): Payer: Self-pay

## 2013-02-25 DIAGNOSIS — F4320 Adjustment disorder, unspecified: Secondary | ICD-10-CM

## 2013-03-04 ENCOUNTER — Ambulatory Visit: Payer: Self-pay

## 2013-03-05 ENCOUNTER — Ambulatory Visit (INDEPENDENT_AMBULATORY_CARE_PROVIDER_SITE_OTHER): Payer: Self-pay

## 2013-03-05 DIAGNOSIS — F4320 Adjustment disorder, unspecified: Secondary | ICD-10-CM

## 2013-03-11 ENCOUNTER — Ambulatory Visit: Payer: Self-pay

## 2013-03-18 ENCOUNTER — Ambulatory Visit: Payer: Self-pay

## 2013-03-24 ENCOUNTER — Ambulatory Visit (INDEPENDENT_AMBULATORY_CARE_PROVIDER_SITE_OTHER): Payer: Self-pay

## 2013-03-24 DIAGNOSIS — F4320 Adjustment disorder, unspecified: Secondary | ICD-10-CM

## 2013-04-01 ENCOUNTER — Ambulatory Visit: Payer: Self-pay

## 2013-04-08 ENCOUNTER — Ambulatory Visit (INDEPENDENT_AMBULATORY_CARE_PROVIDER_SITE_OTHER): Payer: Self-pay

## 2013-04-08 DIAGNOSIS — F4320 Adjustment disorder, unspecified: Secondary | ICD-10-CM

## 2013-04-14 ENCOUNTER — Ambulatory Visit (INDEPENDENT_AMBULATORY_CARE_PROVIDER_SITE_OTHER): Payer: Self-pay

## 2013-04-14 DIAGNOSIS — F4320 Adjustment disorder, unspecified: Secondary | ICD-10-CM

## 2013-04-22 ENCOUNTER — Ambulatory Visit (INDEPENDENT_AMBULATORY_CARE_PROVIDER_SITE_OTHER): Payer: Self-pay

## 2013-04-22 DIAGNOSIS — F4320 Adjustment disorder, unspecified: Secondary | ICD-10-CM

## 2013-04-29 ENCOUNTER — Ambulatory Visit: Payer: Self-pay

## 2013-05-06 ENCOUNTER — Ambulatory Visit (INDEPENDENT_AMBULATORY_CARE_PROVIDER_SITE_OTHER): Payer: Self-pay

## 2013-05-06 DIAGNOSIS — F4320 Adjustment disorder, unspecified: Secondary | ICD-10-CM

## 2013-05-13 ENCOUNTER — Ambulatory Visit (INDEPENDENT_AMBULATORY_CARE_PROVIDER_SITE_OTHER): Payer: Self-pay

## 2013-05-13 DIAGNOSIS — F4320 Adjustment disorder, unspecified: Secondary | ICD-10-CM

## 2013-05-20 ENCOUNTER — Ambulatory Visit (INDEPENDENT_AMBULATORY_CARE_PROVIDER_SITE_OTHER): Payer: Self-pay

## 2013-05-20 DIAGNOSIS — F4320 Adjustment disorder, unspecified: Secondary | ICD-10-CM

## 2013-06-24 ENCOUNTER — Ambulatory Visit: Payer: Self-pay

## 2013-06-25 ENCOUNTER — Ambulatory Visit (INDEPENDENT_AMBULATORY_CARE_PROVIDER_SITE_OTHER): Payer: Self-pay

## 2013-06-25 DIAGNOSIS — F4320 Adjustment disorder, unspecified: Secondary | ICD-10-CM

## 2013-07-08 ENCOUNTER — Ambulatory Visit (INDEPENDENT_AMBULATORY_CARE_PROVIDER_SITE_OTHER): Payer: Self-pay

## 2013-07-08 DIAGNOSIS — F4320 Adjustment disorder, unspecified: Secondary | ICD-10-CM

## 2013-07-29 ENCOUNTER — Ambulatory Visit (INDEPENDENT_AMBULATORY_CARE_PROVIDER_SITE_OTHER): Payer: Self-pay

## 2013-07-29 DIAGNOSIS — F4320 Adjustment disorder, unspecified: Secondary | ICD-10-CM

## 2013-09-08 ENCOUNTER — Ambulatory Visit (INDEPENDENT_AMBULATORY_CARE_PROVIDER_SITE_OTHER): Admitting: General Surgery

## 2013-09-08 ENCOUNTER — Other Ambulatory Visit (INDEPENDENT_AMBULATORY_CARE_PROVIDER_SITE_OTHER): Payer: Self-pay | Admitting: General Surgery

## 2013-09-08 VITALS — BP 124/74 | HR 80 | Resp 14 | Ht 62.25 in | Wt 153.4 lb

## 2013-09-08 DIAGNOSIS — K645 Perianal venous thrombosis: Secondary | ICD-10-CM | POA: Insufficient documentation

## 2013-09-08 MED ORDER — DIBUCAINE 1 % EX OINT: TOPICAL_OINTMENT | Freq: Three times a day (TID) | CUTANEOUS | Status: DC | PRN

## 2013-09-08 NOTE — Patient Instructions (Addendum)
Warm water baths as needed.    Dibucaine ointment as needed.  (I will send prescription to pharmacy)  Stay UN-constipated.    Continue miralax.

## 2013-09-08 NOTE — Progress Notes (Signed)
Patient ID: Carol Duncan, female   DOB: [birth year], 62 y.o.   MRN: [id number]  No chief complaint on file.   HPI Carol Duncan is a 62 y.o. female.   HPI Pt is 62 yo F who presents with around 1 week of severe perianal pain.  She denies bleeding.  She feels like something comes in and out of her anus.  She has had issues with hemorrhoids on and off for 20 years.  She takes miralax 1/2 dose every other day.  She does not have any issues with constipation.    Past Medical History  Diagnosis Date  . Hypertension   . Anxiety   . Diverticulitis     Past Surgical History  Procedure Laterality Date  . Abdominal hysterectomy      No family history on file.  Social History History  Substance Use Topics  . Smoking status: Current Every Day Smoker -- 0.50 packs/day  . Smokeless tobacco: Not on file  . Alcohol Use: No    No Known Allergies  Current Outpatient Prescriptions  Medication Sig Dispense Refill  . ALPRAZolam (XANAX) 1 MG tablet Take by mouth See admin instructions. Take 1/2 to 1 tablet twice dailly as needed for anxiety.      . naproxen (NAPROSYN) 500 MG tablet Take 500 mg by mouth 2 (two) times daily with a meal.      . acyclovir (ZOVIRAX) 400 MG tablet Take 400 mg by mouth every 4 (four) hours while awake.      . dibucaine (NUPERCAINAL) 1 % ointment Apply topically 3 (three) times daily as needed for pain.  30 g  2  . escitalopram (LEXAPRO) 10 MG tablet Take 10 mg by mouth daily.      . hyoscyamine (LEVSIN/SL) 0.125 MG SL tablet Place 1 tablet (0.125 mg total) under the tongue every 4 (four) hours as needed for cramping.  12 tablet  0  . lisinopril (PRINIVIL,ZESTRIL) 20 MG tablet Take 20 mg by mouth daily.      Marland Kitchen zolpidem (AMBIEN) 10 MG tablet Take 10 mg by mouth at bedtime as needed. Sleep.       No current facility-administered medications for this visit.    Review of Systems Review of Systems  Musculoskeletal: Positive for arthralgias.    Hematological: Bruises/bleeds easily.    Blood pressure 124/74, pulse 80, resp. rate 14, height 5' 2.25" (1.581 m), weight 153 lb 6.4 oz (69.582 kg).  Physical Exam Physical Exam  Constitutional: She is oriented to person, place, and time. She appears well-developed and well-nourished. No distress.  HENT:  Head: Normocephalic and atraumatic.  Eyes: Pupils are equal, round, and reactive to light. No scleral icterus.  Cardiovascular: Normal rate.   Pulmonary/Chest: Effort normal. No respiratory distress.  Abdominal: Soft.  Genitourinary: Rectal exam shows external hemorrhoid (Left posterior thrombosed hemorrhoid.  moderate in size.  ). Rectal exam shows no fissure, no mass and anal tone normal.     Neurological: She is alert and oriented to person, place, and time. Coordination normal.  Skin: Skin is warm and dry. No rash noted. She is not diaphoretic. No erythema. No pallor.  Psychiatric: She has a normal mood and affect. Her behavior is normal. Judgment and thought content normal.    Data Reviewed n/a  Assessment/Plan    External hemorrhoid, thrombosed Anesthetized skin with 1% lidocaine with epinephrine.  Clamped hemorrhoid and excised with scissors.    Re-approximated skin with 4-0 chromic.    Follow up  with me in 2-3 weeks.       Carol Duncan 09/08/2013, 4:28 PM

## 2013-09-08 NOTE — Assessment & Plan Note (Signed)
Anesthetized skin with 1% lidocaine with epinephrine.  Clamped hemorrhoid and excised with scissors.    Re-approximated skin with 4-0 chromic.    Follow up with me in 2-3 weeks.

## 2013-09-14 ENCOUNTER — Encounter (INDEPENDENT_AMBULATORY_CARE_PROVIDER_SITE_OTHER): Payer: Self-pay

## 2013-09-25 ENCOUNTER — Encounter (INDEPENDENT_AMBULATORY_CARE_PROVIDER_SITE_OTHER): Admitting: General Surgery

## 2013-10-01 ENCOUNTER — Encounter (INDEPENDENT_AMBULATORY_CARE_PROVIDER_SITE_OTHER): Payer: Self-pay | Admitting: General Surgery

## 2013-11-09 ENCOUNTER — Ambulatory Visit: Payer: Self-pay | Admitting: Podiatry

## 2013-11-27 ENCOUNTER — Ambulatory Visit

## 2013-11-27 VITALS — BP 145/92 | HR 76 | Resp 18

## 2013-11-27 DIAGNOSIS — M79609 Pain in unspecified limb: Secondary | ICD-10-CM

## 2013-11-27 DIAGNOSIS — B070 Plantar wart: Secondary | ICD-10-CM

## 2013-11-27 DIAGNOSIS — B351 Tinea unguium: Secondary | ICD-10-CM

## 2013-11-27 MED ORDER — TERBINAFINE HCL 250 MG PO TABS: 250.0000 mg | ORAL_TABLET | Freq: Every day | ORAL | Status: DC

## 2013-11-27 NOTE — Patient Instructions (Signed)
Onychomycosis/Fungal Toenails  WHAT IS IT? An infection that lies within the keratin of your nail plate that is caused by a fungus.  WHY ME? Fungal infections affect all ages, sexes, races, and creeds.  There may be many factors that predispose you to a fungal infection such as age, coexisting medical conditions such as diabetes, or an autoimmune disease; stress, medications, fatigue, genetics, etc.  Bottom line: fungus thrives in a warm, moist environment and your shoes offer such a location.  IS IT CONTAGIOUS? Theoretically, yes.  You do not want to share shoes, nail clippers or files with someone who has fungal toenails.  Walking around barefoot in the same room or sleeping in the same bed is unlikely to transfer the organism.  It is important to realize, however, that fungus can spread easily from one nail to the next on the same foot.  HOW DO WE TREAT THIS?  There are several ways to treat this condition.  Treatment may depend on many factors such as age, medications, pregnancy, liver and kidney conditions, etc.  It is best to ask your doctor which options are available to you.  1. No treatment.   Unlike many other medical concerns, you can live with this condition.  However for many people this can be a painful condition and may lead to ingrown toenails or a bacterial infection.  It is recommended that you keep the nails cut short to help reduce the amount of fungal nail. 2. Topical treatment.  These range from herbal remedies to prescription strength nail lacquers.  About 40-50% effective, topicals require twice daily application for approximately 9 to 12 months or until an entirely new nail has grown out.  The most effective topicals are medical grade medications available through physicians offices. 3. Oral antifungal medications.  With an 80-90% cure rate, the most common oral medication requires 3 to 4 months of therapy and stays in your system for a year as the new nail grows out.  Oral  antifungal medications do require blood work to make sure it is a safe drug for you.  A liver function panel will be performed prior to starting the medication and after the first month of treatment.  It is important to have the blood work performed to avoid any harmful side effects.  In general, this medication safe but blood work is required. 4. Laser Therapy.  This treatment is performed by applying a specialized laser to the affected nail plate.  This therapy is noninvasive, fast, and non-painful.  It is not covered by insurance and is therefore, out of pocket.  The results have been very good with a 80-95% cure rate.  The [facility] is the only practice in the area to offer this therapy. 5. Permanent Nail Avulsion.  Removing the entire nail so that a new nail will not grow back. 6. Formula 3 to be obtained at the office. Apply to affected nail twice daily for 6-12 months

## 2013-11-27 NOTE — Progress Notes (Signed)
   Subjective:    Patient ID: Carol Duncan, female    DOB: Mar 28, 1951, 62 y.o.   MRN: [id number]  HPI  I have a wart on the bottom of my great toe and been going on for about 1 month and hurts to flex it and ingrown toenail on my big toenail on right foot and has been going on for three months and I get some pedicures and general information there is some flaking on the right big toe and been going on for about years and have taken the lamisil two years and used biotin    Review of Systems  Constitutional: Negative.   HENT: Negative.   Eyes: Negative.   Respiratory: Negative.   Cardiovascular: Negative.   Gastrointestinal: Negative.   Endocrine: Negative.   Genitourinary: Negative.   Musculoskeletal: Negative.   Skin: Negative.   Allergic/Immunologic: Negative.        Gluten  Neurological: Negative.   Hematological: Negative.   Psychiatric/Behavioral: Negative.        Objective:   Physical Exam Neurovascular status is intact pedal pulses palpable bilateral epicritic and proprioceptive sensations intact and symmetric bilateral. 2 skin issues are noted the right hallux nail plate shows some discoloration dystrophy thickening yellowing consistent with onychomycosis patient is a previous treatment oral and topical antifungal for good success however his had recurrence. The second issue is a nucleated keratotic lesion under the hallux IP joint left great toe consistent with verruca plantaris orthopedic biomechanical exam otherwise unremarkable noncontributory no other new findings or abnormal findings are noted.       Assessment & Plan:  Assessment onychomycosis right hallux nail. Plan at this time initiate treatment with oral Lamisil 250 daily x3 months. Patient also has verruca plantaris left hallux which is debrided and packed with 60% salicylic acid under occlusion at this time dispensed Aquasol or salicylic acid instruction sheet apply salicylic acid for 5-7 days as  instructed under occlusion with the tape followup in the future and as-needed basis if fails to improve suggest a six-month followup for fungus nail and evaluation will also use topical formula 3 as an adjunct to the oral Lamisil medication formula 3 applied twice daily to the affected toe for 6-12 months next  Alvan Dame DPM

## 2013-12-01 ENCOUNTER — Encounter (HOSPITAL_COMMUNITY): Payer: Self-pay | Admitting: Emergency Medicine

## 2013-12-01 ENCOUNTER — Emergency Department (HOSPITAL_COMMUNITY)
Admission: EM | Admit: 2013-12-01 | Discharge: 2013-12-01 | Discharge status: Against Medical Advice | Attending: General Surgery | Admitting: General Surgery

## 2013-12-01 ENCOUNTER — Emergency Department (HOSPITAL_COMMUNITY)

## 2013-12-01 DIAGNOSIS — Z9089 Acquired absence of other organs: Secondary | ICD-10-CM | POA: Insufficient documentation

## 2013-12-01 DIAGNOSIS — K810 Acute cholecystitis: Secondary | ICD-10-CM | POA: Diagnosis present

## 2013-12-01 DIAGNOSIS — Z79899 Other long term (current) drug therapy: Secondary | ICD-10-CM | POA: Insufficient documentation

## 2013-12-01 DIAGNOSIS — Z90710 Acquired absence of both cervix and uterus: Secondary | ICD-10-CM | POA: Insufficient documentation

## 2013-12-01 DIAGNOSIS — F411 Generalized anxiety disorder: Secondary | ICD-10-CM | POA: Insufficient documentation

## 2013-12-01 DIAGNOSIS — K824 Cholesterolosis of gallbladder: Secondary | ICD-10-CM

## 2013-12-01 DIAGNOSIS — I10 Essential (primary) hypertension: Secondary | ICD-10-CM | POA: Insufficient documentation

## 2013-12-01 HISTORY — DX: Other complications of anesthesia, initial encounter: T88.59XA

## 2013-12-01 HISTORY — DX: Adverse effect of unspecified anesthetic, initial encounter: T41.45XA

## 2013-12-01 LAB — COMPREHENSIVE METABOLIC PANEL
ALT: 131 U/L — ABNORMAL HIGH (ref 0–35)
AST: 158 U/L — ABNORMAL HIGH (ref 0–37)
Albumin: 4 g/dL (ref 3.5–5.2)
Alkaline Phosphatase: 102 U/L (ref 39–117)
BUN: 12 mg/dL (ref 6–23)
CO2: 25 mEq/L (ref 19–32)
Calcium: 10 mg/dL (ref 8.4–10.5)
Chloride: 99 mEq/L (ref 96–112)
Creatinine, Ser: 0.7 mg/dL (ref 0.50–1.10)
GFR calc Af Amer: >90 mL/min (ref >90)
GFR calc non Af Amer: >90 mL/min (ref >90)
Glucose, Bld: 124 mg/dL — ABNORMAL HIGH (ref 70–99)
Potassium: 3.4 mEq/L — ABNORMAL LOW (ref 3.5–5.1)
Sodium: 137 mEq/L (ref 135–145)
Total Bilirubin: 2.4 mg/dL — ABNORMAL HIGH (ref 0.3–1.2)
Total Protein: 7.1 g/dL (ref 6.0–8.3)

## 2013-12-01 LAB — CBC
HCT: 41.6 % (ref 36.0–46.0)
Hemoglobin: 14.4 g/dL (ref 12.0–15.0)
MCH: 30.6 pg (ref 26.0–34.0)
MCHC: 34.6 g/dL (ref 30.0–36.0)
MCV: 88.5 fL (ref 78.0–100.0)
Platelets: 256 10*3/uL (ref 150–400)
RBC: 4.7 MIL/uL (ref 3.87–5.11)
RDW: 12.7 % (ref 11.5–15.5)
WBC: 14.1 10*3/uL — ABNORMAL HIGH (ref 4.0–10.5)

## 2013-12-01 LAB — URINALYSIS, ROUTINE W REFLEX MICROSCOPIC
Bilirubin Urine: NEGATIVE
Glucose, UA: NEGATIVE mg/dL
Ketones, ur: NEGATIVE mg/dL
Leukocytes, UA: NEGATIVE
Nitrite: NEGATIVE
Protein, ur: NEGATIVE mg/dL
Specific Gravity, Urine: 1.01 (ref 1.005–1.030)
Urobilinogen, UA: 1 mg/dL (ref 0.0–1.0)
pH: 8 (ref 5.0–8.0)

## 2013-12-01 LAB — URINE MICROSCOPIC-ADD ON

## 2013-12-01 LAB — LIPASE, BLOOD: Lipase: 103 U/L — ABNORMAL HIGH (ref 11–59)

## 2013-12-01 MED ORDER — HYDROMORPHONE HCL PF 1 MG/ML IJ SOLN
1.0000 mg | Freq: Once | INTRAMUSCULAR | Status: AC
  Administered 2013-12-01: 1 mg via INTRAVENOUS
  Filled 2013-12-01: qty 1

## 2013-12-01 MED ORDER — ONDANSETRON HCL 4 MG/2ML IJ SOLN
4.0000 mg | Freq: Once | INTRAMUSCULAR | Status: AC
  Administered 2013-12-01: 4 mg via INTRAVENOUS
  Filled 2013-12-01: qty 2

## 2013-12-01 MED ORDER — DIAZEPAM 5 MG/ML IJ SOLN
5.0000 mg | Freq: Once | INTRAMUSCULAR | Status: AC
  Administered 2013-12-01: 5 mg via INTRAVENOUS
  Filled 2013-12-01: qty 2

## 2013-12-01 MED ORDER — IOHEXOL 300 MG/ML  SOLN
50.0000 mL | Freq: Once | INTRAMUSCULAR | Status: AC | PRN
  Administered 2013-12-01: 50 mL via ORAL

## 2013-12-01 MED ORDER — IOHEXOL 300 MG/ML  SOLN
100.0000 mL | Freq: Once | INTRAMUSCULAR | Status: AC | PRN
  Administered 2013-12-01: 100 mL via INTRAVENOUS

## 2013-12-01 MED ORDER — METOCLOPRAMIDE HCL 5 MG/ML IJ SOLN
10.0000 mg | Freq: Once | INTRAMUSCULAR | Status: AC
  Administered 2013-12-01: 10 mg via INTRAVENOUS
  Filled 2013-12-01: qty 2

## 2013-12-01 NOTE — H&P (Signed)
Carol Duncan 1951-06-02  [id number].   Primary Care MD: Dr. Johnella Moloney Chief Complaint/Reason for Consult: acute cholecystitis HPI: This is a 62 yo female who is otherwise healthy who has had 2 episodes of pain in the past similar to this episode.  Dr. Kevan Ny sent her for a HIDA scan in 2013 that was inconclusive for function, but had patent ducts.  She was presented with the option of cholecystectomy, but decided to change her lifestyle.  She has since lost 50lbs, only eats organic foods, etc.  Yesterday though, around 1700pm she developed pain in her epigastrium and RUQ.  She then developed profuse nausea and vomiting.  She came to the [facility] last night and after further work up revealed elevated LFTs, WBC, and imaging c/w acute cholecystitis.  We have been called to evaluate the patient for admission. (pain has currently resolved)  ROS: Please see HPI, otherwise all other systems are negative  History reviewed. No pertinent family history.  Past Medical History  Diagnosis Date  . Hypertension   . Anxiety   . Diverticulitis     Past Surgical History  Procedure Laterality Date  . Abdominal hysterectomy    . Myomectomy    . Appendectomy      during hysterectomy    Social History:  reports that she has never smoked. She does not have any smokeless tobacco history on file. She reports that she drinks alcohol. Her drug history is not on file.  Allergies:  Allergies  Allergen Reactions  . Shellfish Allergy     Shrimp ok, Scallops, clams, oysters give angioedema     (Not in a hospital admission)  Blood pressure 112/63, pulse 70, temperature 97.6 F (36.4 C), temperature source Oral, resp. rate 16, SpO2 100.00%. Physical Exam: General: pleasant, WD, WN white female who is laying in bed in NAD HEENT: head is normocephalic, atraumatic.  Sclera are noninjected.  PERRL.  Ears and nose without any masses or lesions.  Mouth is pink and moist Heart: regular, rate, and rhythm.   Normal s1,s2. No obvious murmurs, gallops, or rubs noted.  Palpable radial and pedal pulses bilaterally Lungs: CTAB, no wheezes, rhonchi, or rales noted.  Respiratory effort nonlabored Abd: soft, NT, ND, +BS, no masses, hernias, or organomegaly MS: all 4 extremities are symmetrical with no cyanosis, clubbing, or edema. Skin: warm and dry with no masses, lesions, or rashes Psych: A&Ox3 with an appropriate affect.    Results for orders placed during the hospital encounter of 12/01/13 (from the past 48 hour(s))  CBC     Status: Abnormal   Collection Time    12/01/13  3:08 AM      Result Value Range   WBC 14.1 (*) 4.0 - 10.5 K/uL   RBC 4.70  3.87 - 5.11 MIL/uL   Hemoglobin 14.4  12.0 - 15.0 g/dL   HCT 29.5  28.4 - 13.2 %   MCV 88.5  78.0 - 100.0 fL   MCH 30.6  26.0 - 34.0 pg   MCHC 34.6  30.0 - 36.0 g/dL   RDW 44.0  10.2 - 72.5 %   Platelets 256  150 - 400 K/uL  COMPREHENSIVE METABOLIC PANEL     Status: Abnormal   Collection Time    12/01/13  3:08 AM      Result Value Range   Sodium 137  135 - 145 mEq/L   Potassium 3.4 (*) 3.5 - 5.1 mEq/L   Chloride 99  96 - 112 mEq/L  CO2 25  19 - 32 mEq/L   Glucose, Bld 124 (*) 70 - 99 mg/dL   BUN 12  6 - 23 mg/dL   Creatinine, Ser 1.30  0.50 - 1.10 mg/dL   Calcium 86.5  8.4 - 78.4 mg/dL   Total Protein 7.1  6.0 - 8.3 g/dL   Albumin 4.0  3.5 - 5.2 g/dL   AST 696 (*) 0 - 37 U/L   ALT 131 (*) 0 - 35 U/L   Alkaline Phosphatase 102  39 - 117 U/L   Total Bilirubin 2.4 (*) 0.3 - 1.2 mg/dL   GFR calc non Af Amer >90  >90 mL/min   GFR calc Af Amer >90  >90 mL/min   Comment: (NOTE)     The eGFR has been calculated using the CKD EPI equation.     This calculation has not been validated in all clinical situations.     eGFR's persistently <90 mL/min signify possible Chronic Kidney     Disease.  LIPASE, BLOOD     Status: Abnormal   Collection Time    12/01/13  3:08 AM      Result Value Range   Lipase 103 (*) 11 - 59 U/L  URINALYSIS, ROUTINE W  REFLEX MICROSCOPIC     Status: Abnormal   Collection Time    12/01/13  4:26 AM      Result Value Range   Color, Urine YELLOW  YELLOW   APPearance TURBID (*) CLEAR   Specific Gravity, Urine 1.010  1.005 - 1.030   pH 8.0  5.0 - 8.0   Glucose, UA NEGATIVE  NEGATIVE mg/dL   Hgb urine dipstick LARGE (*) NEGATIVE   Bilirubin Urine NEGATIVE  NEGATIVE   Ketones, ur NEGATIVE  NEGATIVE mg/dL   Protein, ur NEGATIVE  NEGATIVE mg/dL   Urobilinogen, UA 1.0  0.0 - 1.0 mg/dL   Nitrite NEGATIVE  NEGATIVE   Leukocytes, UA NEGATIVE  NEGATIVE  URINE MICROSCOPIC-ADD ON     Status: Abnormal   Collection Time    12/01/13  4:26 AM      Result Value Range   Squamous Epithelial / LPF RARE  RARE   RBC / HPF 3-6  <3 RBC/hpf   Bacteria, UA FEW (*) RARE   Urine-Other AMORPHOUS URATES/PHOSPHATES     US Abdomen Complete  12/01/2013   CLINICAL DATA:  Abdominal pain, pericholecystic fluid  EXAM: ULTRASOUND ABDOMEN COMPLETE  COMPARISON:  CT scan 12/01/2013  FINDINGS: Gallbladder:  Gallstones are noted within gallbladder the largest measures 6 mm. There is mild thickening of gallbladder wall up to 3.2 mm. Small pericholecystic fluid. Although there is no sonographic Murphy sign early cholecystitis cannot be excluded. Clinical correlation is necessary.  Common bile duct:  Diameter: 6 mm in diameter  Liver:  No intrahepatic biliary ductal dilatation. Probable cyst in left hepatic lobe measures 1 cm.  IVC:  No abnormality visualized.  Pancreas:  Visualized portion unremarkable.  Spleen:  Measures 7.7 cm in length.  Normal echogenicity.  Right Kidney:  Length: 10.6 cm. Echogenicity within normal limits. No mass or hydronephrosis visualized.  Left Kidney:  Length: 10.8 cm. No hydronephrosis or diagnostic renal calculus. A complex cyst in upper pole measures 1.7 x 1.3 cm.  Abdominal aorta:  No aneurysm visualized.  Measures up to 2.3 cm in diameter.  Other findings:  None  IMPRESSION: Gallstones are noted within gallbladder. Mild  thickening of gallbladder wall and small pericholecystic fluid. Although there is no sonographic Murphy's sign  early cholecystitis cannot be excluded. Clinical correlation is necessary. No hydronephrosis or diagnostic renal calculus. Complex cyst in upper pole of the left kidney measures 1.7 x 1.3 cm. No aortic aneurysm.   Electronically Signed   By: Natasha Mead M.D.   On: 12/01/2013 08:05   Ct Abdomen Pelvis W Contrast  12/01/2013   CLINICAL DATA:  Severe left upper quadrant pain with nausea and vomiting and abdominal distention.  EXAM: CT ABDOMEN AND PELVIS WITH CONTRAST  TECHNIQUE: Multidetector CT imaging of the abdomen and pelvis was performed using the standard protocol following bolus administration of intravenous contrast.  CONTRAST:  OMNIPAQUE IOHEXOL 300 MG/ML  SOLN  COMPARISON:  CT of the abdomen and pelvis April 03, 2012  FINDINGS: Limited view of the lung bases remain clear. Included heart and pericardium are nonsuspicious.  Hepatic sub cm hypodensities again seen, and may reflect cysts ; the liver is diffusely mildly hypodense suggesting fatty infiltration. Splenic granulomas. Gallbladder fold, with mild pericholecystic fluid now seen. Pancreas and adrenal glands are unremarkable and unchanged.  Kidneys are well located, symmetric enhancement without nephrolithiasis, hydronephrosis or solid renal masses, 14 mm left upper pole simple cyst. Delayed imaging demonstrates symmetric prompt excretion in the proximal urinary collecting system. The ureters are normal in course and caliber. Urinary bladder is well distended and unremarkable. Great vessels are normal course and caliber with mild calcific atherosclerosis. Resolution of the stranding about the superior mesenteric artery present on prior examination. Internal reproductive organs appear surgically absent.  Stomach, small and large bowel are normal in course and caliber, contrast has yet to reach the large bowel. Scattered colonic  diverticula. Mild amount of retained large bowel stool. No intraperitoneal free air. Soft tissues are nonsuspicious. Grade 1 L4-5 anterolisthesis on degenerative basis.  IMPRESSION: Pericholecystic fluid could reflect acute cholecystitis. This may be further characterized with ultrasound as clinically indicated.  Diverticulosis without CT findings of acute diverticulitis. Mild amount of retained large bowel stool.   Electronically Signed   By: Awilda Metro   On: 12/01/2013 06:33       Assessment/Plan 1. Early acute cholecystitis 2. Possible passage of CBD stone 3. Fibromyalgia 4. H/o HTN (doesn't take meds for this) 5. transaminitis 6. Mild pancreatitis, chemically only  Plan: 1. We will get the patient admitted and start her on IV Cipro for possible early cholecystitis given appearance on CT and [place].  Her LFTs are elevated, but given pain course, my suspect is that she has passed a CBD stone.  We will recheck LFTs in the morning.  If they are trending down, then we will proceed with cholecystectomy.  If they are trending up, she may require a GI evaluation.  Her lipase was slightly elevated at 107.  She has no symptoms at this time of pancreatitis.  Suspect this was secondary to possible passage of CBD stone. 2. She can have clear liquids today, then NPO p MN. 3. This has all been d/w her and she understands and agrees.  OSBORNE,KELLY E 12/01/2013, 11:25 AM Pager: [phone]  The patient decided to leave the [facility] without specific follow up.  I'm not sure exactly what her plans are. We'll have to see how she does. The patient can contact our office for further followup.  Ovidio Kin, MD, [facility] Surgery Pager: [phone] Office phone:  [phone].

## 2013-12-01 NOTE — ED Provider Notes (Signed)
Medications  ondansetron (ZOFRAN) injection 4 mg (4 mg Intravenous Given 12/01/13 0331)  HYDROmorphone (DILAUDID) injection 1 mg (1 mg Intravenous Given 12/01/13 0410)  diazepam (VALIUM) injection 5 mg (5 mg Intravenous Given 12/01/13 0410)  metoCLOPramide (REGLAN) injection 10 mg (10 mg Intravenous Given 12/01/13 0410)  HYDROmorphone (DILAUDID) injection 1 mg (1 mg Intravenous Given 12/01/13 0448)  iohexol (OMNIPAQUE) 300 MG/ML solution 50 mL (50 mLs Oral Contrast Given 12/01/13 0522)  iohexol (OMNIPAQUE) 300 MG/ML solution 100 mL (100 mLs Intravenous Contrast Given 12/01/13 0604)   Filed Vitals:   12/01/13 0859  BP: 112/63  Pulse: 70  Temp:   Resp: 16    Patient care acquired from Ivonne Andrew, PA-C with ultrasound pending to look for cholecystitis. [place] back with signs of acute cholecystitis. Updated patient of results and plan to consult surgeon.   9:26 AM Remi Haggard from General Surgery will come down and see patient in ED.   General Surgery will admit patient to their service. Patient is agreeable to plan. The patient appears reasonably stabilized for admission considering the current resources, flow, and capabilities available in the ED at this time, and I doubt any other [facility] requiring further screening and/or treatment in the ED prior to admission. Patient d/w with Dr. Dierdre Highman, agrees with plan.   2:53 PM Despite spending 30 minutes discussing risks of leaving the hospital without undergoing cholecystectomy patient understands the risk and does not wish to stay. [facility] and informed them of this.       Jeannetta Ellis, PA-C 12/01/13 1514

## 2013-12-01 NOTE — ED Notes (Signed)
Pt requesting to go home for a few hours to get some stuff done. Pt Notified she can't leave and come back, she would be leaving AMA and her IV would have to come back. EDPA Jenn notified and educated pt on risk and benefit of leaving AMA. pts niece came and pt states ready to leave. Pt Signed AMA form and states will call [facility] when she gets home. Pt denies pain, n/v/d or any other concern at this time.

## 2013-12-01 NOTE — ED Provider Notes (Signed)
Medical screening examination/treatment/procedure(s) were performed by non-physician practitioner and as supervising physician I was immediately available for consultation/collaboration.    Estefan Pattison, MD 12/01/13 0713 

## 2013-12-01 NOTE — ED Notes (Signed)
Pt reports taking Zofran and Bentyl at home for pain, reports abdominal distention as well.

## 2013-12-01 NOTE — ED Provider Notes (Signed)
Medical screening examination/treatment/procedure(s) were performed by non-physician practitioner and as supervising physician I was immediately available for consultation/collaboration.    Sunnie Nielsen, MD 12/01/13 [phone]

## 2013-12-01 NOTE — ED Notes (Addendum)
Pt reports that she has sever left upper abdominal pain, nauseated, reports this pain also occurred in April 2013, states "I know I need an IV and 3cc of Dilaudid, I know you have protocols, but I don't want to wait 3 hours like I did last time when I know this is what I need for the pain." Pt ambulatory to triage, a& o x4.

## 2013-12-01 NOTE — Progress Notes (Signed)
   CARE MANAGEMENT ED NOTE 12/01/2013  Patient:  Carol Duncan, Carol Duncan   Account Number:  [id number]  Date Initiated:  12/01/2013  Documentation initiated by:  Edd Arbour  Subjective/Objective Assessment:   62 yr old female tricare pt seen by PA- C from Surgery for cholecystitis     Subjective/Objective Assessment Detail:     Action/Plan:   Cm called PA-C for admission status but pt AMA   Action/Plan Detail:   Anticipated DC Date:       Status Recommendation to Physician:   Result of Recommendation:    Other ED Services  Consult Working Plan    DC [employer]  Other    Choice offered to / List presented to:            Status of service:  Completed, signed off  ED Comments:   ED Comments Detail:

## 2013-12-01 NOTE — ED Provider Notes (Signed)
CSN: [id number]     Arrival date & time 12/01/13  0134 History   First MD Initiated Contact with Patient 12/01/13 [phone]     Chief Complaint  Patient presents with  . Abdominal Pain   HPI  History provided by the patient. Patient is a 62 year old female with history of hypertension, anxiety, diverticulitis, abdominal hysterectomy, appendectomy who presents with complaints of diffuse abdominal pains and cramps. Patient reports that symptoms began yesterday evening with slight cramps and pains near her shoulders and back. This later moved into her lower back and abdominal area diffusely. Symptoms have been associated with 3 episodes of vomiting. Denies any diarrhea or constipation. No fever, chills or sweats. She reports having a similar episode of severe sharp pains and cramps one year ago. States she was treated with Dilaudid and other narcotic pain medications to help symptoms. She states her workup was normal including a normal CAT scan without any cause of her symptoms. Patient states since that time she has changed to a vegetarian diet and lost over 30 pounds in general he was doing well without any recurrence of similar symptoms. Last colonoscopy was 12 years ago unremarkable.  PCP: Dr. Kevan Ny  Past Medical History  Diagnosis Date  . Hypertension   . Anxiety   . Diverticulitis    Past Surgical History  Procedure Laterality Date  . Abdominal hysterectomy     History reviewed. No pertinent family history. History  Substance Use Topics  . Smoking status: Current Every Day Smoker -- 0.50 packs/day  . Smokeless tobacco: Not on file  . Alcohol Use: No   OB History   Grav Para Term Preterm Abortions TAB SAB Ect Mult Living                 Review of Systems  Constitutional: Negative for fever, chills and diaphoresis.  Respiratory: Negative for shortness of breath.   Cardiovascular: Negative for chest pain.  Gastrointestinal: Positive for nausea, vomiting and abdominal pain. Negative  for diarrhea and constipation.  Genitourinary: Negative for dysuria, frequency, hematuria and flank pain.  All other systems reviewed and are negative.    Allergies  Review of patient's allergies indicates no known allergies.  Home Medications   Current Outpatient Rx  Name  Route  Sig  Dispense  Refill  . progesterone (PROMETRIUM) 100 MG capsule   Oral   Take 100 mg by mouth daily.         Marland Kitchen acyclovir (ZOVIRAX) 400 MG tablet   Oral   Take 400 mg by mouth every 4 (four) hours while awake.         Marland Kitchen ALPRAZolam (XANAX) 1 MG tablet   Oral   Take 0.5 mg by mouth 3 (three) times daily as needed.          . dibucaine (NUPERCAINAL) 1 % ointment   Topical   Apply topically 3 (three) times daily as needed for pain.   30 g   2   . EXPIRED: hyoscyamine (LEVSIN/SL) 0.125 MG SL tablet   Sublingual   Place 1 tablet (0.125 mg total) under the tongue every 4 (four) hours as needed for cramping.   12 tablet   0   . zolpidem (AMBIEN) 10 MG tablet   Oral   Take 10 mg by mouth at bedtime as needed. Sleep.          BP 218/100  Pulse 66  Temp(Src) 97.6 F (36.4 C) (Oral)  Resp 21  SpO2 100% Physical  Exam  Nursing note and vitals reviewed. Constitutional: She is oriented to person, place, and time. She appears well-developed and well-nourished. No distress.  HENT:  Head: Normocephalic.  Cardiovascular: Normal rate and regular rhythm.   Pulmonary/Chest: Effort normal and breath sounds normal. No respiratory distress. She has no wheezes. She has no rales.  Abdominal: Soft. Bowel sounds are normal. She exhibits no distension. There is no tenderness. There is no rebound and no guarding.  Neurological: She is alert and oriented to person, place, and time.  Skin: Skin is warm and dry. No rash noted.  Psychiatric: She has a normal mood and affect. Her behavior is normal.    ED Course  Procedures  DIAGNOSTIC STUDIES: Oxygen Saturation is 100% on room air.    COORDINATION OF  CARE:  Nursing notes reviewed. Vital signs reviewed. Initial pt interview and examination performed.   3:53 AM-Pt seen and evaluated.  Discussed work up plan with pt at bedside, which includes .  Pt agrees with plan.  Patient reports significant improvement of pain after pain medication. She does have abnormal LFTs and lipase. I discussed findings and plan for a CT scan to rule out choledocholithiasis or pancreatitis. She agrees with plan.  Patient discussed with Francee Piccolo PA-C. She will follow CT scan and reevaluate patient.  Treatment plan initiated: Medications  HYDROmorphone (DILAUDID) injection 1 mg (not administered)  diazepam (VALIUM) injection 5 mg (not administered)  ondansetron (ZOFRAN) injection 4 mg (4 mg Intravenous Given 12/01/13 0331)   Results for orders placed during the hospital encounter of 12/01/13  CBC      Result Value Range   WBC 14.1 (*) 4.0 - 10.5 K/uL   RBC 4.70  3.87 - 5.11 MIL/uL   Hemoglobin 14.4  12.0 - 15.0 g/dL   HCT 16.1  09.6 - 04.5 %   MCV 88.5  78.0 - 100.0 fL   MCH 30.6  26.0 - 34.0 pg   MCHC 34.6  30.0 - 36.0 g/dL   RDW 40.9  81.1 - 91.4 %   Platelets 256  150 - 400 K/uL  COMPREHENSIVE METABOLIC PANEL      Result Value Range   Sodium 137  135 - 145 mEq/L   Potassium 3.4 (*) 3.5 - 5.1 mEq/L   Chloride 99  96 - 112 mEq/L   CO2 25  19 - 32 mEq/L   Glucose, Bld 124 (*) 70 - 99 mg/dL   BUN 12  6 - 23 mg/dL   Creatinine, Ser 7.82  0.50 - 1.10 mg/dL   Calcium 95.6  8.4 - 21.3 mg/dL   Total Protein 7.1  6.0 - 8.3 g/dL   Albumin 4.0  3.5 - 5.2 g/dL   AST 086 (*) 0 - 37 U/L   ALT 131 (*) 0 - 35 U/L   Alkaline Phosphatase 102  39 - 117 U/L   Total Bilirubin 2.4 (*) 0.3 - 1.2 mg/dL   GFR calc non Af Amer >90  >90 mL/min   GFR calc Af Amer >90  >90 mL/min  LIPASE, BLOOD      Result Value Range   Lipase 103 (*) 11 - 59 U/L  URINALYSIS, ROUTINE W REFLEX MICROSCOPIC      Result Value Range   Color, Urine YELLOW  YELLOW   APPearance  TURBID (*) CLEAR   Specific Gravity, Urine 1.010  1.005 - 1.030   pH 8.0  5.0 - 8.0   Glucose, UA NEGATIVE  NEGATIVE mg/dL  Hgb urine dipstick LARGE (*) NEGATIVE   Bilirubin Urine NEGATIVE  NEGATIVE   Ketones, ur NEGATIVE  NEGATIVE mg/dL   Protein, ur NEGATIVE  NEGATIVE mg/dL   Urobilinogen, UA 1.0  0.0 - 1.0 mg/dL   Nitrite NEGATIVE  NEGATIVE   Leukocytes, UA NEGATIVE  NEGATIVE  URINE MICROSCOPIC-ADD ON      Result Value Range   Squamous Epithelial / LPF RARE  RARE   RBC / HPF 3-6  <3 RBC/hpf   Bacteria, UA FEW (*) RARE   Urine-Other AMORPHOUS URATES/PHOSPHATES       Imaging Review No results found.    MDM  No diagnosis found.     Angus Seller, PA-C 12/01/13 [phone]

## 2013-12-01 NOTE — ED Notes (Signed)
Contact person Ross Ludwig [phone]

## 2014-01-05 ENCOUNTER — Encounter (INDEPENDENT_AMBULATORY_CARE_PROVIDER_SITE_OTHER): Admitting: General Surgery

## 2015-02-03 ENCOUNTER — Ambulatory Visit (INDEPENDENT_AMBULATORY_CARE_PROVIDER_SITE_OTHER): Payer: Self-pay

## 2015-02-03 DIAGNOSIS — F348 Other persistent mood [affective] disorders: Secondary | ICD-10-CM

## 2015-03-15 ENCOUNTER — Encounter

## 2015-03-29 ENCOUNTER — Encounter

## 2015-09-24 ENCOUNTER — Emergency Department (HOSPITAL_COMMUNITY)

## 2015-09-24 ENCOUNTER — Encounter (HOSPITAL_COMMUNITY): Payer: Self-pay | Admitting: Emergency Medicine

## 2015-09-24 ENCOUNTER — Emergency Department (HOSPITAL_COMMUNITY)
Admission: EM | Admit: 2015-09-24 | Discharge: 2015-09-24 | Disposition: A | Discharge status: Home / Self Care | Attending: Emergency Medicine | Admitting: Emergency Medicine

## 2015-09-24 DIAGNOSIS — F419 Anxiety disorder, unspecified: Secondary | ICD-10-CM | POA: Diagnosis not present

## 2015-09-24 DIAGNOSIS — I10 Essential (primary) hypertension: Secondary | ICD-10-CM | POA: Insufficient documentation

## 2015-09-24 DIAGNOSIS — Z8719 Personal history of other diseases of the digestive system: Secondary | ICD-10-CM | POA: Diagnosis not present

## 2015-09-24 DIAGNOSIS — Z79899 Other long term (current) drug therapy: Secondary | ICD-10-CM | POA: Diagnosis not present

## 2015-09-24 DIAGNOSIS — R1012 Left upper quadrant pain: Secondary | ICD-10-CM | POA: Diagnosis present

## 2015-09-24 DIAGNOSIS — R110 Nausea: Secondary | ICD-10-CM | POA: Insufficient documentation

## 2015-09-24 DIAGNOSIS — R1013 Epigastric pain: Secondary | ICD-10-CM | POA: Diagnosis not present

## 2015-09-24 LAB — COMPREHENSIVE METABOLIC PANEL WITH GFR
ALT: 19 U/L (ref 14–54)
AST: 31 U/L (ref 15–41)
Albumin: 4.3 g/dL (ref 3.5–5.0)
Alkaline Phosphatase: 53 U/L (ref 38–126)
Anion gap: 9 (ref 5–15)
BUN: 14 mg/dL (ref 6–20)
CO2: 26 mmol/L (ref 22–32)
Calcium: 9.9 mg/dL (ref 8.9–10.3)
Chloride: 98 mmol/L — ABNORMAL LOW (ref 101–111)
Creatinine, Ser: 0.77 mg/dL (ref 0.44–1.00)
GFR calc Af Amer: >60 mL/min
GFR calc non Af Amer: >60 mL/min
Glucose, Bld: 143 mg/dL — ABNORMAL HIGH (ref 65–99)
Potassium: 4 mmol/L (ref 3.5–5.1)
Sodium: 133 mmol/L — ABNORMAL LOW (ref 135–145)
Total Bilirubin: 1 mg/dL (ref 0.3–1.2)
Total Protein: 7.6 g/dL (ref 6.5–8.1)

## 2015-09-24 LAB — CBC WITH DIFFERENTIAL/PLATELET
Basophils Absolute: 0 K/uL (ref 0.0–0.1)
Basophils Relative: 0 %
Eosinophils Absolute: 0 K/uL (ref 0.0–0.7)
Eosinophils Relative: 0 %
HCT: 43.3 % (ref 36.0–46.0)
Hemoglobin: 15.4 g/dL — ABNORMAL HIGH (ref 12.0–15.0)
Lymphocytes Relative: 14 %
Lymphs Abs: 1.1 K/uL (ref 0.7–4.0)
MCH: 32.6 pg (ref 26.0–34.0)
MCHC: 35.6 g/dL (ref 30.0–36.0)
MCV: 91.5 fL (ref 78.0–100.0)
Monocytes Absolute: 0.4 K/uL (ref 0.1–1.0)
Monocytes Relative: 5 %
Neutro Abs: 6.4 K/uL (ref 1.7–7.7)
Neutrophils Relative %: 81 %
Platelets: 234 K/uL (ref 150–400)
RBC: 4.73 MIL/uL (ref 3.87–5.11)
RDW: 12.3 % (ref 11.5–15.5)
WBC: 8 K/uL (ref 4.0–10.5)

## 2015-09-24 LAB — URINALYSIS, ROUTINE W REFLEX MICROSCOPIC
Bilirubin Urine: NEGATIVE
Glucose, UA: NEGATIVE mg/dL
Ketones, ur: NEGATIVE mg/dL
Leukocytes, UA: NEGATIVE
Nitrite: NEGATIVE
Protein, ur: 30 mg/dL — AB
Specific Gravity, Urine: 1.011 (ref 1.005–1.030)
Urobilinogen, UA: 0.2 mg/dL (ref 0.0–1.0)
pH: 7.5 (ref 5.0–8.0)

## 2015-09-24 LAB — URINE MICROSCOPIC-ADD ON

## 2015-09-24 LAB — I-STAT TROPONIN, ED
Troponin i, poc: 0.02 ng/mL (ref 0.00–0.08)
Troponin i, poc: 0.01 ng/mL (ref 0.00–0.08)

## 2015-09-24 LAB — I-STAT CG4 LACTIC ACID, ED
Lactic Acid, Venous: 1.37 mmol/L (ref 0.5–2.0)
Lactic Acid, Venous: 0.93 mmol/L (ref 0.5–2.0)

## 2015-09-24 LAB — LIPASE, BLOOD: Lipase: 19 U/L — ABNORMAL LOW (ref 22–51)

## 2015-09-24 MED ORDER — HYDROMORPHONE HCL 1 MG/ML IJ SOLN
1.0000 mg | Freq: Once | INTRAMUSCULAR | Status: AC
  Administered 2015-09-24: 1 mg via INTRAVENOUS
  Filled 2015-09-24: qty 1

## 2015-09-24 MED ORDER — ONDANSETRON HCL 4 MG/2ML IJ SOLN
4.0000 mg | Freq: Once | INTRAMUSCULAR | Status: AC
  Administered 2015-09-24: 4 mg via INTRAVENOUS
  Filled 2015-09-24: qty 2

## 2015-09-24 MED ORDER — NITROGLYCERIN 0.4 MG SL SUBL
0.4000 mg | SUBLINGUAL_TABLET | SUBLINGUAL | Status: DC | PRN
  Administered 2015-09-24 (×2): 0.4 mg via SUBLINGUAL
  Filled 2015-09-24: qty 1

## 2015-09-24 MED ORDER — SODIUM CHLORIDE 0.9 % IV SOLN
1000.0000 mL | Freq: Once | INTRAVENOUS | Status: AC
  Administered 2015-09-24: 1000 mL via INTRAVENOUS

## 2015-09-24 MED ORDER — IOHEXOL 300 MG/ML  SOLN
100.0000 mL | Freq: Once | INTRAMUSCULAR | Status: AC | PRN
  Administered 2015-09-24: 100 mL via INTRAVENOUS

## 2015-09-24 MED ORDER — ASPIRIN 81 MG PO CHEW
324.0000 mg | CHEWABLE_TABLET | Freq: Once | ORAL | Status: AC
Start: 2015-09-24 — End: 2015-09-24
  Administered 2015-09-24: 324 mg via ORAL
  Filled 2015-09-24: qty 4

## 2015-09-24 MED ORDER — ALUM & MAG HYDROXIDE-SIMETH 200-200-20 MG/5ML PO SUSP
15.0000 mL | Freq: Once | ORAL | Status: AC
  Administered 2015-09-24: 15 mL via ORAL
  Filled 2015-09-24: qty 30

## 2015-09-24 MED ORDER — MORPHINE SULFATE (PF) 4 MG/ML IV SOLN
4.0000 mg | Freq: Once | INTRAVENOUS | Status: AC
  Administered 2015-09-24: 4 mg via INTRAVENOUS
  Filled 2015-09-24: qty 1

## 2015-09-24 MED ORDER — IOHEXOL 300 MG/ML  SOLN
50.0000 mL | Freq: Once | INTRAMUSCULAR | Status: DC | PRN
  Administered 2015-09-24: 50 mL via ORAL

## 2015-09-24 MED ORDER — LIDOCAINE VISCOUS 2 % MT SOLN
15.0000 mL | Freq: Once | OROMUCOSAL | Status: AC
  Administered 2015-09-24: 15 mL via OROMUCOSAL
  Filled 2015-09-24: qty 15

## 2015-09-24 NOTE — ED Provider Notes (Signed)
CSN: [id number]     Arrival date & time 09/24/15  4098 History   First MD Initiated Contact with Patient 09/24/15 0840     Chief Complaint  Patient presents with  . Abdominal Pain     (Consider location/radiation/quality/duration/timing/severity/associated sxs/prior Treatment) Patient is a 64 y.o. female presenting with abdominal pain. The history is provided by the patient.  Abdominal Pain Pain location:  LUQ Pain quality: aching, sharp and shooting   Pain radiates to:  Does not radiate Pain severity:  Severe Onset quality:  Gradual Duration:  12 hours Timing:  Constant Progression:  Worsening Chronicity:  New Context: eating   Relieved by:  Nothing Worsened by:  Eating Ineffective treatments:  None tried Associated symptoms: nausea   Associated symptoms: no chest pain, no chills, no dysuria, no fever, no shortness of breath and no vomiting     64 yo F with a chief complaint of left upper quadrant abdominal pain. This started last night about 12 hours ago. Patient states it's worse with eating. Since then patient has been lying in bed. Does not make this better. She took 3 Zofran which did not improve her symptom pathology. Patient denies fevers or chills. Patient's last normal bowel movement was 2 days ago. Pain is constant. Nothing seems to make it better. Patient has prior myomectomy.  Denies other abdominal surgery. Hypertensive, patient denies prior history. Denies hyperlipidemia diabetes. Patient is a current smoker. Patient has positive family history for MI.   Past Medical History  Diagnosis Date  . Hypertension   . Anxiety   . Diverticulitis   . Complication of anesthesia     Takes alot to keep patient under anesthesia   Past Surgical History  Procedure Laterality Date  . Abdominal hysterectomy    . Myomectomy    . Appendectomy      during hysterectomy   Family History  Problem Relation Age of Onset  . Brain cancer Mother   . Heart attack Father    Social  History  Substance Use Topics  . Smoking status: Never Smoker   . Smokeless tobacco: Never Used  . Alcohol Use: Yes     Comment: very rare   OB History    No data available     Review of Systems  Constitutional: Negative for fever and chills.  HENT: Negative for congestion and rhinorrhea.   Eyes: Negative for redness and visual disturbance.  Respiratory: Negative for shortness of breath and wheezing.   Cardiovascular: Negative for chest pain and palpitations.  Gastrointestinal: Positive for nausea and abdominal pain. Negative for vomiting.  Genitourinary: Negative for dysuria and urgency.  Musculoskeletal: Negative for myalgias and arthralgias.  Skin: Negative for pallor and wound.  Neurological: Negative for dizziness and headaches.      Allergies  Shellfish allergy and Wheat bran  Home Medications   Prior to Admission medications   Medication Sig Start Date End Date Taking? Authorizing Provider  ALPRAZolam Prudy Feeler) 1 MG tablet Take 0.5 mg by mouth 3 (three) times daily as needed for anxiety.    Yes Historical Provider, MD  Biotin 5000 MCG CAPS Take 10,000 mcg by mouth daily.    Yes Historical Provider, MD  HYDROcodone-acetaminophen (NORCO) 10-325 MG per tablet Take 0.5 tablets by mouth every 6 (six) hours as needed for severe pain (takes one-half tablet).   Yes Historical Provider, MD  hyoscyamine (LEVSIN/SL) 0.125 MG SL tablet Place 1 tablet (0.125 mg total) under the tongue every 4 (four) hours as needed  for cramping. 04/03/12 09/24/15 Yes Shari Upstill, PA-C  lamoTRIgine (LAMICTAL) 200 MG tablet Take 200 mg by mouth every morning.   Yes Historical Provider, MD  ondansetron (ZOFRAN) 8 MG tablet Take 8 mg by mouth every 8 (eight) hours as needed for nausea or vomiting.  08/08/15  Yes Historical Provider, MD  pramipexole (MIRAPEX) 0.5 MG tablet Take 1.5 mg by mouth at bedtime as needed (for sleep).    Yes Historical Provider, MD  thyroid (ARMOUR) 30 MG tablet Take 30 mg by mouth  2 (two) times daily.   Yes Historical Provider, MD  valACYclovir (VALTREX) 500 MG tablet Take 500 mg by mouth 2 (two) times daily. As needed for outbreaks 09/23/15  Yes Historical Provider, MD  VYVANSE 10 MG CAPS Take 10 mg by mouth every morning.  08/29/15  Yes Historical Provider, MD   BP 104/70 mmHg  Pulse 76  Temp(Src) 98.4 F (36.9 C) (Oral)  Resp 16  SpO2 99% Physical Exam  Constitutional: She is oriented to person, place, and time. She appears well-developed and well-nourished. No distress.  HENT:  Head: Normocephalic and atraumatic.  Eyes: EOM are normal. Pupils are equal, round, and reactive to light.  Neck: Normal range of motion. Neck supple.  Cardiovascular: Normal rate and regular rhythm.  Exam reveals no gallop and no friction rub.   No murmur heard. Pulmonary/Chest: Effort normal. She has no wheezes. She has no rales.  Abdominal: Soft. She exhibits no distension. There is no tenderness.  Musculoskeletal: She exhibits no edema or tenderness.  Neurological: She is alert and oriented to person, place, and time.  Skin: Skin is warm and dry. She is not diaphoretic.  Psychiatric: She has a normal mood and affect. Her behavior is normal.    ED Course  Procedures (including critical care time) Labs Review Labs Reviewed  CBC WITH DIFFERENTIAL/PLATELET - Abnormal; Notable for the following:    Hemoglobin 15.4 (*)    All other components within normal limits  COMPREHENSIVE METABOLIC PANEL - Abnormal; Notable for the following:    Sodium 133 (*)    Chloride 98 (*)    Glucose, Bld 143 (*)    All other components within normal limits  LIPASE, BLOOD - Abnormal; Notable for the following:    Lipase 19 (*)    All other components within normal limits  URINALYSIS, ROUTINE W REFLEX MICROSCOPIC (NOT AT [facility]) - Abnormal; Notable for the following:    APPearance CLOUDY (*)    Hgb urine dipstick LARGE (*)    Protein, ur 30 (*)    All other components within normal limits  URINE  MICROSCOPIC-ADD ON - Abnormal; Notable for the following:    Bacteria, UA MANY (*)    All other components within normal limits  I-STAT TROPOININ, ED  I-STAT CG4 LACTIC ACID, ED  I-STAT TROPOININ, ED  I-STAT CG4 LACTIC ACID, ED    Imaging Review Ct Abdomen Pelvis W Contrast  09/24/2015   CLINICAL DATA:  Abdominal pain and nausea beginning 12 hours ago. Some chest discomfort and associated back pain. Hypertension presently.  EXAM: CT ABDOMEN AND PELVIS WITH CONTRAST  TECHNIQUE: Multidetector CT imaging of the abdomen and pelvis was performed using the standard protocol following bolus administration of intravenous contrast.  CONTRAST:  50mL OMNIPAQUE IOHEXOL 300 MG/ML SOLN, OMNIPAQUE IOHEXOL 300 MG/ML SOLN  COMPARISON:  12/01/2013  FINDINGS: Lung bases are clear. No pleural or pericardial fluid. 2 small cysts are noted in the right lobe of the liver,  unchanged since the previous study. No calcified gallstones are seen. Small gallstones were present on a previous ultrasound. The spleen is normal except for insignificant calcified granuloma is. The pancreas is normal. The adrenal glands are normal. The right kidney is normal. The left kidney is normal except for a 1 cm cyst at the upper pole. There is atherosclerosis of the aorta and its branch vessels but no aneurysm. The IVC is normal. No retroperitoneal mass or adenopathy. No free intraperitoneal fluid or air. No evidence of bowel obstruction. There is diverticulosis without evidence of diverticulitis. The bladder is normal. Previous hysterectomy. Ordinary mild degenerative changes affect the spine, most prominently facet arthropathy at L4-5.  IMPRESSION: No acute finding. No cause of the presenting symptoms is identified. The patient was seen have small gallstones by ultrasound in 2014. These are not visible by CT. No CT evidence of cholecystitis or obstruction. No evidence of bowel pathology.   Electronically Signed   By: Paulina Fusi M.D.   On:  09/24/2015 11:22   I have personally reviewed and evaluated these images and lab results as part of my medical decision-making.   EKG Interpretation   Date/Time:  Saturday September 24 2015 08:44:38 EDT Ventricular Rate:  62 PR Interval:  149 QRS Duration: 95 QT Interval:  458 QTC Calculation: 465 R Axis:   4 Text Interpretation:  Sinus rhythm LAE, consider biatrial enlargement  hyperacute T waves Confirmed by Esau Fridman MD, DANIEL ([id number]) on 09/24/2015  9:02:24 AM      MDM   Final diagnoses:  Abdominal pain, epigastric    64 yo F with a chief complaint of abdominal pain. This started about 12 hours ago. Eating makes it worse. Patient does not exert yourself unsure if it's exertional. EKG with possible hyperacute T waves. Will obtain a delta troponin. CBC CMP lipase. Treated with GI cocktail nitroglycerin aspirin.  Patient continued to have fairly significant tenderness. CT scan ordered.  CT scan without acute findings in the abdomen or pelvis. Blood pressure improved while her stay. Reassess patient pain-free. Delta troponin negative as well. Discussed results patient will follow with her PCP. Start on Zantac. 1:23 PM:  I have discussed the diagnosis/risks/treatment options with the patient and caregiver and believe the pt to be eligible for discharge home to follow-up with PCP. We also discussed returning to the ED immediately if new or worsening sx occur. We discussed the sx which are most concerning (e.g., sudden worsening pain, fever, inability to tolerate by mouth) that necessitate immediate return. Medications administered to the patient during their visit and any new prescriptions provided to the patient are listed below.  Medications given during this visit Medications  nitroGLYCERIN (NITROSTAT) SL tablet 0.4 mg (0.4 mg Sublingual Given 09/24/15 0938)  iohexol (OMNIPAQUE) 300 MG/ML solution 50 mL (50 mLs Oral Contrast Given 09/24/15 1020)  0.9 %  sodium chloride infusion (0 mLs  Intravenous Stopped 09/24/15 1020)  morphine 4 MG/ML injection 4 mg (4 mg Intravenous Given 09/24/15 0905)  ondansetron (ZOFRAN) injection 4 mg (4 mg Intravenous Given 09/24/15 0905)  alum & mag hydroxide-simeth (MAALOX/MYLANTA) 200-200-20 MG/5ML suspension 15 mL (15 mLs Oral Given 09/24/15 0905)  lidocaine (XYLOCAINE) 2 % viscous mouth solution 15 mL (15 mLs Mouth/Throat Given 09/24/15 0905)  aspirin chewable tablet 324 mg (324 mg Oral Given 09/24/15 0930)  HYDROmorphone (DILAUDID) injection 1 mg (1 mg Intravenous Given 09/24/15 1016)  ondansetron (ZOFRAN) injection 4 mg (4 mg Intravenous Given 09/24/15 1016)  iohexol (OMNIPAQUE) 300 MG/ML solution  100 mL (100 mLs Intravenous Contrast Given 09/24/15 1100)    Discharge Medication List as of 09/24/2015 12:12 PM      The patient appears reasonably screen and/or stabilized for discharge and I doubt any other medical condition or other [facility] requiring further screening, evaluation, or treatment in the ED at this time prior to discharge.    Melene Plan, DO 09/24/15 1323

## 2015-09-24 NOTE — Discharge Instructions (Signed)
Take zantac for your pain.  Follow up with your PCP.  Abdominal Pain, Adult Many things can cause abdominal pain. Usually, abdominal pain is not caused by a disease and will improve without treatment. It can often be observed and treated at home. Your health care provider will do a physical exam and possibly order blood tests and X-rays to help determine the seriousness of your pain. However, in many cases, more time must pass before a clear cause of the pain can be found. Before that point, your health care provider may not know if you need more testing or further treatment. HOME CARE INSTRUCTIONS Monitor your abdominal pain for any changes. The following actions may help to alleviate any discomfort you are experiencing:  Only take over-the-counter or prescription medicines as directed by your health care provider.  Do not take laxatives unless directed to do so by your health care provider.  Try a clear liquid diet (broth, tea, or water) as directed by your health care provider. Slowly move to a bland diet as tolerated. SEEK MEDICAL CARE IF:  You have unexplained abdominal pain.  You have abdominal pain associated with nausea or diarrhea.  You have pain when you urinate or have a bowel movement.  You experience abdominal pain that wakes you in the night.  You have abdominal pain that is worsened or improved by eating food.  You have abdominal pain that is worsened with eating fatty foods.  You have a fever. SEEK IMMEDIATE MEDICAL CARE IF:  Your pain does not go away within 2 hours.  You keep throwing up (vomiting).  Your pain is felt only in portions of the abdomen, such as the right side or the left lower portion of the abdomen.  You pass bloody or black tarry stools. MAKE SURE YOU:  Understand these instructions.  Will watch your condition.  Will get help right away if you are not doing well or get worse.   This information is not intended to replace advice given to you  by your health care provider. Make sure you discuss any questions you have with your health care provider.   Document Released: 09/12/2005 Document Revised: 08/24/2015 Document Reviewed: 08/12/2013 Elsevier Interactive Patient Education [employer]

## 2015-09-24 NOTE — ED Notes (Signed)
Pt reports abd pain and nausea started last night at 930pm. Pt took  3 zofran 4 mg PO which pt vomited right away, per pt . Pt denies diarrhea nor Hx GERD. Pt also reports chest discomfort and intermittent back pain. Today's BP is high 204/98 yet pt denies Hx HTN. Pt denies dizziness nor headache.

## 2015-10-18 ENCOUNTER — Other Ambulatory Visit (HOSPITAL_COMMUNITY): Payer: Self-pay | Admitting: Internal Medicine

## 2015-10-18 DIAGNOSIS — R140 Abdominal distension (gaseous): Secondary | ICD-10-CM

## 2015-10-31 ENCOUNTER — Encounter (HOSPITAL_COMMUNITY)

## 2015-10-31 ENCOUNTER — Ambulatory Visit (HOSPITAL_COMMUNITY)

## 2015-11-08 ENCOUNTER — Ambulatory Visit: Admitting: Neurology

## 2015-11-22 ENCOUNTER — Other Ambulatory Visit: Payer: Self-pay | Admitting: Internal Medicine

## 2015-11-22 DIAGNOSIS — R262 Difficulty in walking, not elsewhere classified: Secondary | ICD-10-CM

## 2015-11-29 ENCOUNTER — Encounter (HOSPITAL_COMMUNITY)

## 2015-11-29 ENCOUNTER — Ambulatory Visit (HOSPITAL_COMMUNITY)

## 2015-12-06 ENCOUNTER — Ambulatory Visit
Admission: RE | Admit: 2015-12-06 | Discharge: 2015-12-06 | Disposition: A | Discharge status: Home / Self Care | Source: Ambulatory Visit | Attending: Internal Medicine | Admitting: Internal Medicine

## 2015-12-06 ENCOUNTER — Ambulatory Visit (HOSPITAL_COMMUNITY)

## 2015-12-06 ENCOUNTER — Encounter (HOSPITAL_COMMUNITY): Admission: RE | Admit: 2015-12-06 | Source: Ambulatory Visit

## 2015-12-06 DIAGNOSIS — R262 Difficulty in walking, not elsewhere classified: Secondary | ICD-10-CM

## 2015-12-06 MED ORDER — GADOBENATE DIMEGLUMINE 529 MG/ML IV SOLN
11.0000 mL | Freq: Once | INTRAVENOUS | Status: AC | PRN
  Administered 2015-12-06: 11 mL via INTRAVENOUS

## 2016-02-26 ENCOUNTER — Inpatient Hospital Stay (HOSPITAL_COMMUNITY)
Admission: EM | Admit: 2016-02-26 | Discharge: 2016-02-28 | DRG: 418 | Disposition: A | Discharge status: Home / Self Care | Attending: Internal Medicine | Admitting: Internal Medicine

## 2016-02-26 ENCOUNTER — Encounter (HOSPITAL_COMMUNITY): Payer: Self-pay | Admitting: *Deleted

## 2016-02-26 ENCOUNTER — Emergency Department (HOSPITAL_COMMUNITY)

## 2016-02-26 DIAGNOSIS — M797 Fibromyalgia: Secondary | ICD-10-CM | POA: Diagnosis present

## 2016-02-26 DIAGNOSIS — K8010 Calculus of gallbladder with chronic cholecystitis without obstruction: Principal | ICD-10-CM | POA: Diagnosis present

## 2016-02-26 DIAGNOSIS — F3181 Bipolar II disorder: Secondary | ICD-10-CM | POA: Diagnosis not present

## 2016-02-26 DIAGNOSIS — I10 Essential (primary) hypertension: Secondary | ICD-10-CM | POA: Insufficient documentation

## 2016-02-26 DIAGNOSIS — K5909 Other constipation: Secondary | ICD-10-CM | POA: Diagnosis not present

## 2016-02-26 DIAGNOSIS — Z8249 Family history of ischemic heart disease and other diseases of the circulatory system: Secondary | ICD-10-CM

## 2016-02-26 DIAGNOSIS — E876 Hypokalemia: Secondary | ICD-10-CM | POA: Diagnosis not present

## 2016-02-26 DIAGNOSIS — E039 Hypothyroidism, unspecified: Secondary | ICD-10-CM | POA: Diagnosis not present

## 2016-02-26 DIAGNOSIS — R911 Solitary pulmonary nodule: Secondary | ICD-10-CM | POA: Diagnosis present

## 2016-02-26 DIAGNOSIS — I119 Hypertensive heart disease without heart failure: Secondary | ICD-10-CM | POA: Diagnosis present

## 2016-02-26 DIAGNOSIS — I517 Cardiomegaly: Secondary | ICD-10-CM | POA: Diagnosis present

## 2016-02-26 DIAGNOSIS — F419 Anxiety disorder, unspecified: Secondary | ICD-10-CM | POA: Diagnosis present

## 2016-02-26 DIAGNOSIS — Z808 Family history of malignant neoplasm of other organs or systems: Secondary | ICD-10-CM

## 2016-02-26 DIAGNOSIS — R1013 Epigastric pain: Secondary | ICD-10-CM | POA: Diagnosis present

## 2016-02-26 DIAGNOSIS — K8050 Calculus of bile duct without cholangitis or cholecystitis without obstruction: Secondary | ICD-10-CM

## 2016-02-26 DIAGNOSIS — K8020 Calculus of gallbladder without cholecystitis without obstruction: Secondary | ICD-10-CM

## 2016-02-26 DIAGNOSIS — K810 Acute cholecystitis: Secondary | ICD-10-CM

## 2016-02-26 DIAGNOSIS — Z419 Encounter for procedure for purposes other than remedying health state, unspecified: Secondary | ICD-10-CM

## 2016-02-26 HISTORY — DX: Disease of biliary tract, unspecified: K83.9

## 2016-02-26 LAB — URINALYSIS, ROUTINE W REFLEX MICROSCOPIC
Bilirubin Urine: NEGATIVE
Glucose, UA: NEGATIVE mg/dL
Ketones, ur: NEGATIVE mg/dL
Leukocytes, UA: NEGATIVE
Nitrite: NEGATIVE
Protein, ur: 30 mg/dL — AB
Specific Gravity, Urine: 1.014 (ref 1.005–1.030)
pH: 8 (ref 5.0–8.0)

## 2016-02-26 LAB — LIPASE, BLOOD: Lipase: 78 U/L — ABNORMAL HIGH (ref 11–51)

## 2016-02-26 LAB — COMPREHENSIVE METABOLIC PANEL
ALT: 18 U/L (ref 14–54)
AST: 21 U/L (ref 15–41)
Albumin: 4.2 g/dL (ref 3.5–5.0)
Alkaline Phosphatase: 60 U/L (ref 38–126)
Anion gap: 11 (ref 5–15)
BUN: 12 mg/dL (ref 6–20)
CO2: 28 mmol/L (ref 22–32)
Calcium: 9.7 mg/dL (ref 8.9–10.3)
Chloride: 100 mmol/L — ABNORMAL LOW (ref 101–111)
Creatinine, Ser: 0.73 mg/dL (ref 0.44–1.00)
GFR calc Af Amer: >60 mL/min (ref >60)
GFR calc non Af Amer: >60 mL/min (ref >60)
Glucose, Bld: 142 mg/dL — ABNORMAL HIGH (ref 65–99)
Potassium: 3.9 mmol/L (ref 3.5–5.1)
Sodium: 139 mmol/L (ref 135–145)
Total Bilirubin: 0.6 mg/dL (ref 0.3–1.2)
Total Protein: 7.6 g/dL (ref 6.5–8.1)

## 2016-02-26 LAB — URINE MICROSCOPIC-ADD ON

## 2016-02-26 LAB — CBC
HCT: 42.4 % (ref 36.0–46.0)
Hemoglobin: 14.2 g/dL (ref 12.0–15.0)
MCH: 31.5 pg (ref 26.0–34.0)
MCHC: 33.5 g/dL (ref 30.0–36.0)
MCV: 94 fL (ref 78.0–100.0)
Platelets: 215 10*3/uL (ref 150–400)
RBC: 4.51 MIL/uL (ref 3.87–5.11)
RDW: 12.8 % (ref 11.5–15.5)
WBC: 9.1 10*3/uL (ref 4.0–10.5)

## 2016-02-26 LAB — I-STAT TROPONIN, ED: Troponin i, poc: 0 ng/mL (ref 0.00–0.08)

## 2016-02-26 MED ORDER — SODIUM CHLORIDE 0.9 % IV SOLN
1000.0000 mL | INTRAVENOUS | Status: DC
  Administered 2016-02-26: 1000 mL via INTRAVENOUS

## 2016-02-26 MED ORDER — SODIUM CHLORIDE 0.9 % IV SOLN
1000.0000 mL | Freq: Once | INTRAVENOUS | Status: AC
  Administered 2016-02-26: 1000 mL via INTRAVENOUS

## 2016-02-26 MED ORDER — ONDANSETRON HCL 4 MG/2ML IJ SOLN
4.0000 mg | Freq: Once | INTRAMUSCULAR | Status: AC
  Administered 2016-02-26: 4 mg via INTRAVENOUS

## 2016-02-26 MED ORDER — PROMETHAZINE HCL 25 MG/ML IJ SOLN
12.5000 mg | Freq: Once | INTRAMUSCULAR | Status: AC
  Administered 2016-02-26: 12.5 mg via INTRAVENOUS
  Filled 2016-02-26: qty 1

## 2016-02-26 MED ORDER — PANTOPRAZOLE SODIUM 40 MG IV SOLR
40.0000 mg | Freq: Once | INTRAVENOUS | Status: AC
  Administered 2016-02-26: 40 mg via INTRAVENOUS
  Filled 2016-02-26: qty 40

## 2016-02-26 MED ORDER — HYDRALAZINE HCL 20 MG/ML IJ SOLN
10.0000 mg | Freq: Once | INTRAMUSCULAR | Status: AC
  Administered 2016-02-27: 10 mg via INTRAVENOUS
  Filled 2016-02-26: qty 1

## 2016-02-26 MED ORDER — ONDANSETRON HCL 4 MG/2ML IJ SOLN
4.0000 mg | Freq: Once | INTRAMUSCULAR | Status: AC | PRN
  Administered 2016-02-26: 4 mg via INTRAVENOUS
  Filled 2016-02-26: qty 2

## 2016-02-26 MED ORDER — PROMETHAZINE HCL 25 MG PO TABS: 12.5000 mg | ORAL_TABLET | Freq: Once | ORAL | Status: DC

## 2016-02-26 MED ORDER — HYDROMORPHONE HCL 1 MG/ML IJ SOLN
1.0000 mg | Freq: Once | INTRAMUSCULAR | Status: AC
  Administered 2016-02-26: 1 mg via INTRAVENOUS
  Filled 2016-02-26: qty 1

## 2016-02-26 NOTE — ED Notes (Addendum)
Pt woke this am feeling generally poor. Pt had 1 episode of emesis and called EMS.

## 2016-02-26 NOTE — ED Notes (Signed)
Carol SeveranceHenry Duncan would like to be contacted w/ updates [phone]). [name] [phone])

## 2016-02-26 NOTE — ED Notes (Signed)
Pt refused chest x-ray until she receives pain medication.

## 2016-02-26 NOTE — ED Provider Notes (Signed)
CSN: [id number]     Arrival date & time 02/26/16  1853 History   First MD Initiated Contact with Patient 02/26/16 2004     Chief Complaint  Patient presents with  . Chest Pain  . Abdominal Pain     (Consider location/radiation/quality/duration/timing/severity/associated sxs/prior Treatment) HPI  Sudden onset of pain this morning. Pain is intense, aching and sharp in her epigastric area. The intensity increases periodically. Pain does not radiate into her back. Several episodes of vomiting prior to arrival. No diarrhea. Patient reports that she still has her gallbladder and thinks it may be a bile duct problem. No recent cough or shortness of breath. No chest pain or lower extremity swelling or calf pain. No history of PE or DVT. Patient reports this problem is not her heart or lungs. Past Medical History  Diagnosis Date  . Hypertension   . Anxiety   . Diverticulitis   . Complication of anesthesia     Takes alot to keep patient under anesthesia  . Bile duct abnormality    Past Surgical History  Procedure Laterality Date  . Abdominal hysterectomy    . Myomectomy    . Appendectomy      during hysterectomy   Family History  Problem Relation Age of Onset  . Brain cancer Mother   . Heart attack Father    Social History  Substance Use Topics  . Smoking status: Never Smoker   . Smokeless tobacco: Never Used  . Alcohol Use: Yes     Comment: very rare   OB History    No data available     Review of Systems  10 Systems reviewed and are negative for acute change except as noted in the HPI.   Allergies  Shellfish allergy and Wheat bran  Home Medications   Prior to Admission medications   Medication Sig Start Date End Date Taking? Authorizing Provider  ALPRAZolam Prudy Feeler(XANAX) 1 MG tablet Take 0.5 mg by mouth 3 (three) times daily as needed for anxiety.    Yes Historical Provider, MD  Biotin 5000 MCG CAPS Take 10,000 mcg by mouth daily.    Yes Historical Provider, MD   gabapentin (NEURONTIN) 300 MG capsule Take 300-600 mg by mouth at bedtime. Take 300mg s daily at bedtime for 7 days then take 600mg s daily 02/17/16  Yes Historical Provider, MD  HYDROcodone-acetaminophen (NORCO) 10-325 MG per tablet Take 1-2 tablets by mouth every 6 (six) hours as needed for severe pain (takes one-half tablet).    Yes Historical Provider, MD  hyoscyamine (LEVSIN/SL) 0.125 MG SL tablet Place 1 tablet (0.125 mg total) under the tongue every 4 (four) hours as needed for cramping. 04/03/12 02/26/16 Yes Shari Upstill, PA-C  lamoTRIgine (LAMICTAL) 200 MG tablet Take 200 mg by mouth every morning.   Yes Historical Provider, MD  LINZESS 145 MCG CAPS capsule Take 145 mg by mouth daily. 12/27/15  Yes Historical Provider, MD  lithium carbonate 150 MG capsule Take 150 mg by mouth daily. 02/20/16  Yes Historical Provider, MD  ondansetron (ZOFRAN) 8 MG tablet Take 8 mg by mouth every 8 (eight) hours as needed for nausea or vomiting.  08/08/15  Yes Historical Provider, MD  pramipexole (MIRAPEX) 0.5 MG tablet Take 1.5 mg by mouth at bedtime as needed (for sleep).    Yes Historical Provider, MD  thyroid (ARMOUR) 30 MG tablet Take 30 mg by mouth 2 (two) times daily.   Yes Historical Provider, MD  traMADol (ULTRAM) 50 MG tablet Take 100 mg by  mouth 2 (two) times daily as needed for moderate pain.  01/31/16  Yes Historical Provider, MD  valACYclovir (VALTREX) 500 MG tablet Take 500 mg by mouth 2 (two) times daily. As needed for outbreaks 09/23/15  Yes Historical Provider, MD   BP 193/90 mmHg  Pulse 64  Temp(Src) 98.1 F (36.7 C) (Oral)  Resp 15  SpO2 100% Physical Exam  Constitutional: She is oriented to person, place, and time. She appears well-developed and well-nourished.  Patient appears to be in severe pain. She is hunched forward holding her hand over her epigastrium. No respiratory distress. Clear mental status. Skin is warm and dry.  HENT:  Head: Normocephalic and atraumatic.  Eyes: EOM are  normal. Pupils are equal, round, and reactive to light.  Neck: Neck supple.  Cardiovascular: Normal rate, regular rhythm, normal heart sounds and intact distal pulses.   Pulmonary/Chest: Effort normal and breath sounds normal.  Abdominal: Soft. Bowel sounds are normal. She exhibits no distension. There is no tenderness.  No significant reproducible tenderness to palpation. It is soft.  Musculoskeletal: Normal range of motion. She exhibits no edema or tenderness.  Neurological: She is alert and oriented to person, place, and time. She has normal strength. Coordination normal. GCS eye subscore is 4. GCS verbal subscore is 5. GCS motor subscore is 6.  Skin: Skin is warm, dry and intact.  Psychiatric:  Patient is anxious and expressing severe pain.    ED Course  Procedures (including critical care time) Labs Review Labs Reviewed  LIPASE, BLOOD - Abnormal; Notable for the following:    Lipase 78 (*)    All other components within normal limits  COMPREHENSIVE METABOLIC PANEL - Abnormal; Notable for the following:    Chloride 100 (*)    Glucose, Bld 142 (*)    All other components within normal limits  URINALYSIS, ROUTINE W REFLEX MICROSCOPIC (NOT AT [facility]) - Abnormal; Notable for the following:    APPearance TURBID (*)    Hgb urine dipstick LARGE (*)    Protein, ur 30 (*)    All other components within normal limits  URINE MICROSCOPIC-ADD ON - Abnormal; Notable for the following:    Squamous Epithelial / LPF 0-5 (*)    Bacteria, UA RARE (*)    All other components within normal limits  CBC  LITHIUM LEVEL  I-STAT TROPOININ, ED    Imaging Review Dg Chest 2 View  02/26/2016  CLINICAL DATA:  Epigastric chest pain EXAM: CHEST  2 VIEW COMPARISON:  None. FINDINGS: Hyperexpansion is consistent with emphysema. The lungs are clear wiithout focal pneumonia, edema, pneumothorax or pleural effusion. 4-5 mm pulmonary nodule identified in the left mid lung. The cardio pericardial silhouette is  enlarged. Old right clavicle fracture noted. Telemetry leads overlie the chest. IMPRESSION: 1. Cardiomegaly with emphysema. 2. 4 mm left lung nodule. CT chest without contrast recommended to further evaluate. Electronically Signed   By: Kennith Center M.D.   On: 02/26/2016 21:15   US Abdomen Limited Ruq  02/26/2016  CLINICAL DATA:  Upper abdominal pain, history of gallstones. EXAM: US ABDOMEN LIMITED - RIGHT UPPER QUADRANT COMPARISON:  CT abdomen and pelvis September 24, 2015 FINDINGS: Gallbladder: 7 mm echogenic gallstone, with acoustic shadowing. No gallbladder wall thickening or pericholecystic fluid. No sonographic Murphy's sign elicited. Common bile duct: Diameter: 3-4 mm Liver: No focal lesion identified. Within normal limits in parenchymal echogenicity. Hepatopetal portal vein. IMPRESSION: Cholelithiasis without sonographic findings of acute cholecystitis. Electronically Signed   By: Awilda Metro  M.D.   On: 02/26/2016 23:14   I have personally reviewed and evaluated these images and lab results as part of my medical decision-making.   EKG Interpretation   Date/Time:  Sunday February 26 2016 20:36:08 EDT Ventricular Rate:  72 PR Interval:  152 QRS Duration: 95 QT Interval:  418 QTC Calculation: 457 R Axis:   31 Text Interpretation:  Sinus rhythm motion artifact. No St elevation.  Confirmed by Fayrene Fearing  MD, MARK ([id number]) on 02/26/2016 8:42:33 PM     Consult: (23:57), reviewed with Dr. Daphine Deutscher. Gen. surgery will consult in the a.m. for biliary colic. MDM   Final diagnoses:  Biliary colic   Acute and severe epigastric pain. The patient does have gallstones present. Patient has mild elevation in lipase. She is expressing severe pain in the epigastrium radiating to her back. She had an episode last October that was similar. She reports at that time she decided against getting her gallbladder out. Patient reports h/o HTN. States she was told by her doctor that she no longer needed medications and  has been off antihypertensives for over a year. States her BP elevated tonight due to pain.  Arby Barrette, MD 02/27/16 [phone]

## 2016-02-26 NOTE — ED Notes (Signed)
Bed: [id number] Expected date:  Expected time:  Means of arrival:  Comments: EMS- 65 yo flu-like s/sx

## 2016-02-26 NOTE — ED Notes (Signed)
Pt states she is here due to her recurrent Bile Duct Problem. Pt c/o of Abd Pain, R+L Rib cage pain, and nausea.

## 2016-02-27 ENCOUNTER — Inpatient Hospital Stay (HOSPITAL_COMMUNITY)

## 2016-02-27 ENCOUNTER — Encounter (HOSPITAL_COMMUNITY): Payer: Self-pay | Admitting: *Deleted

## 2016-02-27 ENCOUNTER — Encounter (HOSPITAL_COMMUNITY)
Admission: EM | Disposition: A | Discharge status: Home / Self Care | Payer: Self-pay | Source: Home / Self Care | Attending: Internal Medicine

## 2016-02-27 ENCOUNTER — Inpatient Hospital Stay (HOSPITAL_COMMUNITY): Admitting: Certified Registered Nurse Anesthetist

## 2016-02-27 DIAGNOSIS — K8050 Calculus of bile duct without cholangitis or cholecystitis without obstruction: Secondary | ICD-10-CM | POA: Diagnosis not present

## 2016-02-27 DIAGNOSIS — Z8249 Family history of ischemic heart disease and other diseases of the circulatory system: Secondary | ICD-10-CM | POA: Diagnosis not present

## 2016-02-27 DIAGNOSIS — E039 Hypothyroidism, unspecified: Secondary | ICD-10-CM | POA: Diagnosis present

## 2016-02-27 DIAGNOSIS — I10 Essential (primary) hypertension: Secondary | ICD-10-CM | POA: Diagnosis not present

## 2016-02-27 DIAGNOSIS — K8020 Calculus of gallbladder without cholecystitis without obstruction: Secondary | ICD-10-CM | POA: Diagnosis present

## 2016-02-27 DIAGNOSIS — R911 Solitary pulmonary nodule: Secondary | ICD-10-CM | POA: Diagnosis present

## 2016-02-27 DIAGNOSIS — Z808 Family history of malignant neoplasm of other organs or systems: Secondary | ICD-10-CM | POA: Diagnosis not present

## 2016-02-27 DIAGNOSIS — I119 Hypertensive heart disease without heart failure: Secondary | ICD-10-CM | POA: Diagnosis present

## 2016-02-27 DIAGNOSIS — R1013 Epigastric pain: Secondary | ICD-10-CM | POA: Diagnosis present

## 2016-02-27 DIAGNOSIS — K5909 Other constipation: Secondary | ICD-10-CM | POA: Diagnosis present

## 2016-02-27 DIAGNOSIS — F3181 Bipolar II disorder: Secondary | ICD-10-CM | POA: Diagnosis present

## 2016-02-27 DIAGNOSIS — M797 Fibromyalgia: Secondary | ICD-10-CM | POA: Diagnosis present

## 2016-02-27 DIAGNOSIS — I517 Cardiomegaly: Secondary | ICD-10-CM | POA: Diagnosis present

## 2016-02-27 DIAGNOSIS — F419 Anxiety disorder, unspecified: Secondary | ICD-10-CM | POA: Diagnosis present

## 2016-02-27 DIAGNOSIS — K8010 Calculus of gallbladder with chronic cholecystitis without obstruction: Secondary | ICD-10-CM | POA: Diagnosis present

## 2016-02-27 DIAGNOSIS — E876 Hypokalemia: Secondary | ICD-10-CM | POA: Diagnosis present

## 2016-02-27 HISTORY — PX: CHOLECYSTECTOMY: SHX55

## 2016-02-27 LAB — COMPREHENSIVE METABOLIC PANEL
ALT: 17 U/L (ref 14–54)
AST: 22 U/L (ref 15–41)
Albumin: 4.1 g/dL (ref 3.5–5.0)
Alkaline Phosphatase: 60 U/L (ref 38–126)
Anion gap: 13 (ref 5–15)
BUN: 9 mg/dL (ref 6–20)
CO2: 26 mmol/L (ref 22–32)
Calcium: 9 mg/dL (ref 8.9–10.3)
Chloride: 99 mmol/L — ABNORMAL LOW (ref 101–111)
Creatinine, Ser: 0.57 mg/dL (ref 0.44–1.00)
GFR calc Af Amer: >60 mL/min (ref >60)
GFR calc non Af Amer: >60 mL/min (ref >60)
Glucose, Bld: 155 mg/dL — ABNORMAL HIGH (ref 65–99)
Potassium: 3 mmol/L — ABNORMAL LOW (ref 3.5–5.1)
Sodium: 138 mmol/L (ref 135–145)
Total Bilirubin: 0.7 mg/dL (ref 0.3–1.2)
Total Protein: 7.1 g/dL (ref 6.5–8.1)

## 2016-02-27 LAB — CBC
HCT: 42.6 % (ref 36.0–46.0)
Hemoglobin: 14.6 g/dL (ref 12.0–15.0)
MCH: 32.2 pg (ref 26.0–34.0)
MCHC: 34.3 g/dL (ref 30.0–36.0)
MCV: 94 fL (ref 78.0–100.0)
Platelets: 238 10*3/uL (ref 150–400)
RBC: 4.53 MIL/uL (ref 3.87–5.11)
RDW: 12.9 % (ref 11.5–15.5)
WBC: 8.7 10*3/uL (ref 4.0–10.5)

## 2016-02-27 LAB — LIPASE, BLOOD: Lipase: 29 U/L (ref 11–51)

## 2016-02-27 LAB — SURGICAL PCR SCREEN
MRSA, PCR: NEGATIVE
Staphylococcus aureus: POSITIVE — AB

## 2016-02-27 LAB — MAGNESIUM: Magnesium: 1.5 mg/dL — ABNORMAL LOW (ref 1.7–2.4)

## 2016-02-27 LAB — LITHIUM LEVEL: Lithium Lvl: <0.06 mmol/L — ABNORMAL LOW (ref 0.60–1.20)

## 2016-02-27 SURGERY — LAPAROSCOPIC CHOLECYSTECTOMY WITH INTRAOPERATIVE CHOLANGIOGRAM
Anesthesia: General | Site: Abdomen

## 2016-02-27 MED ORDER — ROCURONIUM BROMIDE 100 MG/10ML IV SOLN
INTRAVENOUS | Status: AC
  Filled 2016-02-27: qty 1

## 2016-02-27 MED ORDER — HYDROMORPHONE HCL 1 MG/ML IJ SOLN
INTRAMUSCULAR | Status: AC
  Filled 2016-02-27: qty 1

## 2016-02-27 MED ORDER — HYDRALAZINE HCL 20 MG/ML IJ SOLN: 5.0000 mg | Freq: Four times a day (QID) | INTRAMUSCULAR | Status: DC | PRN

## 2016-02-27 MED ORDER — THYROID 30 MG PO TABS
30.0000 mg | ORAL_TABLET | Freq: Two times a day (BID) | ORAL | Status: DC
  Administered 2016-02-27 – 2016-02-28 (×4): 30 mg via ORAL
  Filled 2016-02-27 (×5): qty 1

## 2016-02-27 MED ORDER — DEXAMETHASONE SODIUM PHOSPHATE 10 MG/ML IJ SOLN
INTRAMUSCULAR | Status: DC | PRN
  Administered 2016-02-27: 10 mg via INTRAVENOUS

## 2016-02-27 MED ORDER — PROPOFOL 10 MG/ML IV BOLUS
INTRAVENOUS | Status: DC | PRN
  Administered 2016-02-27: 180 mg via INTRAVENOUS

## 2016-02-27 MED ORDER — SODIUM CHLORIDE 0.9 % IV SOLN
INTRAVENOUS | Status: DC | PRN
  Administered 2016-02-27: 13:00:00 via INTRAVENOUS

## 2016-02-27 MED ORDER — LACTATED RINGERS IR SOLN
Status: DC | PRN
  Administered 2016-02-27: 1000 mL

## 2016-02-27 MED ORDER — POTASSIUM CHLORIDE CRYS ER 20 MEQ PO TBCR
30.0000 meq | EXTENDED_RELEASE_TABLET | ORAL | Status: DC
  Filled 2016-02-27 (×2): qty 1

## 2016-02-27 MED ORDER — LITHIUM CARBONATE 150 MG PO CAPS
150.0000 mg | ORAL_CAPSULE | Freq: Every day | ORAL | Status: DC
Start: 2016-02-27 — End: 2016-02-28
  Administered 2016-02-27 – 2016-02-28 (×2): 150 mg via ORAL
  Filled 2016-02-27 (×2): qty 1

## 2016-02-27 MED ORDER — ONDANSETRON HCL 4 MG PO TABS
8.0000 mg | ORAL_TABLET | Freq: Three times a day (TID) | ORAL | Status: DC | PRN
  Administered 2016-02-27: 8 mg via ORAL
  Filled 2016-02-27: qty 2

## 2016-02-27 MED ORDER — FENTANYL CITRATE (PF) 100 MCG/2ML IJ SOLN
INTRAMUSCULAR | Status: DC | PRN
Start: 2016-02-27 — End: 2016-02-27
  Administered 2016-02-27: 100 ug via INTRAVENOUS
  Administered 2016-02-27: 50 ug via INTRAVENOUS
  Administered 2016-02-27: 100 ug via INTRAVENOUS

## 2016-02-27 MED ORDER — PROPOFOL 10 MG/ML IV BOLUS
INTRAVENOUS | Status: AC
  Filled 2016-02-27: qty 20

## 2016-02-27 MED ORDER — POTASSIUM CHLORIDE IN NACL 40-0.9 MEQ/L-% IV SOLN
INTRAVENOUS | Status: DC
  Administered 2016-02-27: 100 mL/h via INTRAVENOUS
  Filled 2016-02-27 (×2): qty 1000

## 2016-02-27 MED ORDER — VALACYCLOVIR HCL 500 MG PO TABS
500.0000 mg | ORAL_TABLET | Freq: Two times a day (BID) | ORAL | Status: DC
  Administered 2016-02-27 – 2016-02-28 (×3): 500 mg via ORAL
  Filled 2016-02-27 (×5): qty 1

## 2016-02-27 MED ORDER — HYDROMORPHONE HCL 1 MG/ML IJ SOLN
0.2500 mg | INTRAMUSCULAR | Status: DC | PRN
  Administered 2016-02-27 (×4): 0.5 mg via INTRAVENOUS

## 2016-02-27 MED ORDER — DEXAMETHASONE SODIUM PHOSPHATE 10 MG/ML IJ SOLN
INTRAMUSCULAR | Status: AC
  Filled 2016-02-27: qty 1

## 2016-02-27 MED ORDER — PROMETHAZINE HCL 25 MG/ML IJ SOLN
12.5000 mg | Freq: Once | INTRAMUSCULAR | Status: AC
  Administered 2016-02-27: 12.5 mg via INTRAVENOUS
  Filled 2016-02-27: qty 1

## 2016-02-27 MED ORDER — SUCCINYLCHOLINE CHLORIDE 20 MG/ML IJ SOLN
INTRAMUSCULAR | Status: DC | PRN
  Administered 2016-02-27: 100 mg via INTRAVENOUS

## 2016-02-27 MED ORDER — ONDANSETRON HCL 4 MG/2ML IJ SOLN
INTRAMUSCULAR | Status: DC | PRN
  Administered 2016-02-27: 4 mg via INTRAVENOUS

## 2016-02-27 MED ORDER — ONDANSETRON HCL 4 MG/2ML IJ SOLN: 4.0000 mg | Freq: Once | INTRAMUSCULAR | Status: DC | PRN

## 2016-02-27 MED ORDER — IOHEXOL 300 MG/ML  SOLN
INTRAMUSCULAR | Status: DC | PRN
  Administered 2016-02-27: 10.5 mL

## 2016-02-27 MED ORDER — ALPRAZOLAM 0.5 MG PO TABS: 0.5000 mg | ORAL_TABLET | Freq: Three times a day (TID) | ORAL | Status: DC | PRN

## 2016-02-27 MED ORDER — ROCURONIUM BROMIDE 100 MG/10ML IV SOLN
INTRAVENOUS | Status: DC | PRN
  Administered 2016-02-27 (×2): 10 mg via INTRAVENOUS
  Administered 2016-02-27: 35 mg via INTRAVENOUS

## 2016-02-27 MED ORDER — ENOXAPARIN SODIUM 40 MG/0.4ML ~~LOC~~ SOLN
40.0000 mg | SUBCUTANEOUS | Status: DC
  Administered 2016-02-27: 40 mg via SUBCUTANEOUS
  Filled 2016-02-27 (×2): qty 0.4

## 2016-02-27 MED ORDER — DEXTROSE 5 % IV SOLN
INTRAVENOUS | Status: AC
  Filled 2016-02-27: qty 2

## 2016-02-27 MED ORDER — SUGAMMADEX SODIUM 200 MG/2ML IV SOLN
INTRAVENOUS | Status: AC
  Filled 2016-02-27: qty 2

## 2016-02-27 MED ORDER — MAGNESIUM SULFATE 2 GM/50ML IV SOLN
2.0000 g | Freq: Once | INTRAVENOUS | Status: DC
  Filled 2016-02-27: qty 50

## 2016-02-27 MED ORDER — GABAPENTIN 300 MG PO CAPS: 600.0000 mg | ORAL_CAPSULE | Freq: Every day | ORAL | Status: DC

## 2016-02-27 MED ORDER — ACETAMINOPHEN 10 MG/ML IV SOLN: 1000.0000 mg | Freq: Once | INTRAVENOUS | Status: DC

## 2016-02-27 MED ORDER — SODIUM CHLORIDE 0.9 % IV SOLN: INTRAVENOUS | Status: DC

## 2016-02-27 MED ORDER — POTASSIUM CHLORIDE 10 MEQ/100ML IV SOLN
10.0000 meq | INTRAVENOUS | Status: AC
  Administered 2016-02-27 (×4): 10 meq via INTRAVENOUS
  Filled 2016-02-27 (×4): qty 100

## 2016-02-27 MED ORDER — MIDAZOLAM HCL 2 MG/2ML IJ SOLN
INTRAMUSCULAR | Status: AC
  Filled 2016-02-27: qty 2

## 2016-02-27 MED ORDER — LINACLOTIDE 145 MCG PO CAPS
145.0000 ug | ORAL_CAPSULE | Freq: Every day | ORAL | Status: DC
Start: 2016-02-27 — End: 2016-02-28
  Administered 2016-02-27 – 2016-02-28 (×2): 145 ug via ORAL
  Filled 2016-02-27 (×2): qty 1

## 2016-02-27 MED ORDER — LACTATED RINGERS IV SOLN
INTRAVENOUS | Status: DC | PRN
  Administered 2016-02-27 (×2): via INTRAVENOUS

## 2016-02-27 MED ORDER — DEXTROSE 5 % IV SOLN
2.0000 g | INTRAVENOUS | Status: AC
  Administered 2016-02-27: 2 g via INTRAVENOUS
  Filled 2016-02-27: qty 2

## 2016-02-27 MED ORDER — FENTANYL CITRATE (PF) 100 MCG/2ML IJ SOLN
INTRAMUSCULAR | Status: AC
  Filled 2016-02-27: qty 2

## 2016-02-27 MED ORDER — LABETALOL HCL 5 MG/ML IV SOLN
INTRAVENOUS | Status: AC
  Filled 2016-02-27: qty 4

## 2016-02-27 MED ORDER — HYDROMORPHONE HCL 1 MG/ML IJ SOLN
0.5000 mg | INTRAMUSCULAR | Status: AC | PRN
  Administered 2016-02-27 (×2): 0.5 mg via INTRAVENOUS

## 2016-02-27 MED ORDER — TRAMADOL HCL 50 MG PO TABS
100.0000 mg | ORAL_TABLET | Freq: Two times a day (BID) | ORAL | Status: DC | PRN
  Administered 2016-02-28: 100 mg via ORAL
  Filled 2016-02-27: qty 2

## 2016-02-27 MED ORDER — SODIUM CHLORIDE 0.9 % IV SOLN
INTRAVENOUS | Status: DC
  Administered 2016-02-27 (×2): via INTRAVENOUS

## 2016-02-27 MED ORDER — AMLODIPINE BESYLATE 2.5 MG PO TABS
2.5000 mg | ORAL_TABLET | Freq: Every day | ORAL | Status: DC
  Administered 2016-02-27 – 2016-02-28 (×2): 2.5 mg via ORAL
  Filled 2016-02-27 (×2): qty 1

## 2016-02-27 MED ORDER — MORPHINE SULFATE (PF) 2 MG/ML IV SOLN: 2.0000 mg | INTRAVENOUS | Status: DC | PRN

## 2016-02-27 MED ORDER — POTASSIUM CHLORIDE IN NACL 40-0.9 MEQ/L-% IV SOLN
INTRAVENOUS | Status: DC
  Administered 2016-02-27: 50 mL/h via INTRAVENOUS
  Filled 2016-02-27 (×2): qty 1000

## 2016-02-27 MED ORDER — ONDANSETRON HCL 4 MG/2ML IJ SOLN
INTRAMUSCULAR | Status: AC
  Filled 2016-02-27: qty 2

## 2016-02-27 MED ORDER — BUPIVACAINE-EPINEPHRINE 0.25% -1:200000 IJ SOLN
INTRAMUSCULAR | Status: DC | PRN
  Administered 2016-02-27: 15 mL

## 2016-02-27 MED ORDER — LAMOTRIGINE 200 MG PO TABS
200.0000 mg | ORAL_TABLET | Freq: Every day | ORAL | Status: DC
  Administered 2016-02-27 – 2016-02-28 (×2): 200 mg via ORAL
  Filled 2016-02-27 (×2): qty 1

## 2016-02-27 MED ORDER — MIDAZOLAM HCL 5 MG/5ML IJ SOLN
INTRAMUSCULAR | Status: DC | PRN
  Administered 2016-02-27 (×2): 1 mg via INTRAVENOUS

## 2016-02-27 MED ORDER — SUGAMMADEX SODIUM 200 MG/2ML IV SOLN
INTRAVENOUS | Status: DC | PRN
  Administered 2016-02-27: 125 mg via INTRAVENOUS

## 2016-02-27 MED ORDER — GABAPENTIN 300 MG PO CAPS
300.0000 mg | ORAL_CAPSULE | Freq: Every day | ORAL | Status: DC
  Administered 2016-02-27 (×2): 300 mg via ORAL
  Filled 2016-02-27 (×3): qty 1

## 2016-02-27 MED ORDER — BUPIVACAINE-EPINEPHRINE (PF) 0.25% -1:200000 IJ SOLN
INTRAMUSCULAR | Status: AC
  Filled 2016-02-27: qty 30

## 2016-02-27 MED ORDER — ACETAMINOPHEN 10 MG/ML IV SOLN
INTRAVENOUS | Status: AC
  Administered 2016-02-27: 1000 mg
  Filled 2016-02-27: qty 100

## 2016-02-27 MED ORDER — GABAPENTIN 300 MG PO CAPS
300.0000 mg | ORAL_CAPSULE | Freq: Every day | ORAL | Status: DC
  Filled 2016-02-27: qty 2

## 2016-02-27 MED ORDER — LIDOCAINE HCL (CARDIAC) 20 MG/ML IV SOLN
INTRAVENOUS | Status: AC
  Filled 2016-02-27: qty 5

## 2016-02-27 MED ORDER — LIDOCAINE HCL (CARDIAC) 20 MG/ML IV SOLN
INTRAVENOUS | Status: DC | PRN
  Administered 2016-02-27: 50 mg via INTRAVENOUS

## 2016-02-27 MED ORDER — HYDROMORPHONE HCL 1 MG/ML IJ SOLN
0.5000 mg | INTRAMUSCULAR | Status: DC | PRN
Start: 2016-02-27 — End: 2016-02-28
  Administered 2016-02-27 – 2016-02-28 (×4): 1 mg via INTRAVENOUS
  Filled 2016-02-27 (×4): qty 1

## 2016-02-27 MED ORDER — LABETALOL HCL 5 MG/ML IV SOLN
5.0000 mg | INTRAVENOUS | Status: DC | PRN
  Administered 2016-02-27 (×2): 5 mg via INTRAVENOUS

## 2016-02-27 MED ORDER — 0.9 % SODIUM CHLORIDE (POUR BTL) OPTIME
TOPICAL | Status: DC | PRN
  Administered 2016-02-27: 1000 mL

## 2016-02-27 MED ORDER — PRAMIPEXOLE DIHYDROCHLORIDE 1.5 MG PO TABS
1.5000 mg | ORAL_TABLET | Freq: Every evening | ORAL | Status: DC | PRN
  Filled 2016-02-27: qty 1

## 2016-02-27 MED ORDER — FENTANYL CITRATE (PF) 250 MCG/5ML IJ SOLN
INTRAMUSCULAR | Status: AC
  Filled 2016-02-27: qty 5

## 2016-02-27 SURGICAL SUPPLY — 34 items
APPLIER CLIP 5 13 M/L LIGAMAX5 (MISCELLANEOUS) ×2
APR CLP MED LRG 5 ANG JAW (MISCELLANEOUS) ×1
BAG SPEC RTRVL LRG 6X4 10 (ENDOMECHANICALS) ×1
CABLE HIGH FREQUENCY MONO STRZ (ELECTRODE) ×2 IMPLANT
CHLORAPREP W/TINT 26ML (MISCELLANEOUS) ×2 IMPLANT
CLIP APPLIE 5 13 M/L LIGAMAX5 (MISCELLANEOUS) ×1 IMPLANT
COVER MAYO STAND STRL (DRAPES) IMPLANT
COVER SURGICAL LIGHT HANDLE (MISCELLANEOUS) ×2 IMPLANT
DRAPE C-ARM 42X120 X-RAY (DRAPES) IMPLANT
DRAPE LAPAROSCOPIC ABDOMINAL (DRAPES) ×2 IMPLANT
ELECT REM PT RETURN 9FT ADLT (ELECTROSURGICAL) ×2
ELECTRODE REM PT RTRN 9FT ADLT (ELECTROSURGICAL) ×1 IMPLANT
GLOVE BIO SURGEON STRL SZ 6.5 (GLOVE) ×2 IMPLANT
GLOVE BIOGEL PI IND STRL 7.0 (GLOVE) ×1 IMPLANT
GLOVE BIOGEL PI INDICATOR 7.0 (GLOVE) ×1
GOWN STRL REUS W/TWL 2XL LVL3 (GOWN DISPOSABLE) ×2 IMPLANT
GOWN STRL REUS W/TWL XL LVL3 (GOWN DISPOSABLE) ×4 IMPLANT
KIT BASIN OR (CUSTOM PROCEDURE TRAY) ×2 IMPLANT
LIQUID BAND (GAUZE/BANDAGES/DRESSINGS) ×2 IMPLANT
POSITIONER SURGICAL ARM (MISCELLANEOUS) IMPLANT
POUCH SPECIMEN RETRIEVAL 10MM (ENDOMECHANICALS) ×2 IMPLANT
SCISSORS LAP 5X35 DISP (ENDOMECHANICALS) ×2 IMPLANT
SET CHOLANGIOGRAPH MIX (MISCELLANEOUS) IMPLANT
SET IRRIG TUBING LAPAROSCOPIC (IRRIGATION / IRRIGATOR) ×2 IMPLANT
SUT VIC AB 2-0 SH 27 (SUTURE) ×2
SUT VIC AB 2-0 SH 27X BRD (SUTURE) ×1 IMPLANT
SUT VIC AB 4-0 PS2 18 (SUTURE) ×2 IMPLANT
TAPE CLOTH 4X10 WHT NS (GAUZE/BANDAGES/DRESSINGS) IMPLANT
TOWEL OR 17X26 10 PK STRL BLUE (TOWEL DISPOSABLE) ×2 IMPLANT
TOWEL OR NON WOVEN STRL DISP B (DISPOSABLE) ×2 IMPLANT
TRAY LAPAROSCOPIC (CUSTOM PROCEDURE TRAY) ×2 IMPLANT
TROCAR BLADELESS OPT 5 75 (ENDOMECHANICALS) ×2 IMPLANT
TROCAR SLEEVE XCEL 5X75 (ENDOMECHANICALS) ×4 IMPLANT
TROCAR XCEL BLUNT TIP 100MML (ENDOMECHANICALS) ×2 IMPLANT

## 2016-02-27 NOTE — Anesthesia Postprocedure Evaluation (Signed)
Anesthesia Post Note  Patient: Carol Duncan  Procedure(s) Performed: Procedure(s) (LRB): LAPAROSCOPIC CHOLECYSTECTOMY WITH INTRAOPERATIVE CHOLANGIOGRAM (N/A)  Patient location during evaluation: PACU Anesthesia Type: General Level of consciousness: awake and alert Pain management: pain level controlled Vital Signs Assessment: post-procedure vital signs reviewed and stable Respiratory status: spontaneous breathing, nonlabored ventilation, respiratory function stable and patient connected to nasal cannula oxygen Cardiovascular status: blood pressure returned to baseline and stable Postop Assessment: no signs of nausea or vomiting Anesthetic complications: no    Last Vitals:  Filed Vitals:   02/27/16 1520 02/27/16 1545  BP:    Pulse: 72   Temp:  37.6 C  Resp: 13     Last Pain:  Filed Vitals:   02/27/16 1555  PainSc: 7                  [name]

## 2016-02-27 NOTE — Op Note (Addendum)
02/26/2016 - 02/27/2016  2:35 PM  PATIENT:  Carol Duncan  65 y.o. female  Patient Care Team: Marden Nobleobert Gates, MD as PCP - General (Internal Medicine)  PRE-OPERATIVE DIAGNOSIS:  Gallstone pancreatitis  POST-OPERATIVE DIAGNOSIS:  Gallstone pancreatitis  PROCEDURE:  Procedure(s): LAPAROSCOPIC CHOLECYSTECTOMY WITH INTRAOPERATIVE CHOLANGIOGRAM  SURGEON:  Surgeon(s): Romie LeveeAlicia Yagi, MD  ASSISTANT: none   ANESTHESIA:   local and general  EBL: 10ml Total I/O In: 1000 [I.V.:1000] Out: 1270 [Urine:1250; Blood:20]  DRAINS: none   SPECIMEN:  Source of Specimen:  gallbladder  DISPOSITION OF SPECIMEN:  PATHOLOGY  COUNTS:  YES  PLAN OF CARE: patient already admitted  PATIENT DISPOSITION:  PACU - hemodynamically stable.  INDICATION: Pt admitted for abd pain.  Lipase was mildly elevated.  Pt with a h/o GS pancreatitis.  Lipase resolved by the AM.  I recommended cholecystectomy.  The anatomy & physiology of hepatobiliary & pancreatic function was discussed.  The pathophysiology of gallbladder dysfunction was discussed.  Natural history risks without surgery was discussed.   I feel the risks of no intervention will lead to serious problems that outweigh the operative risks; therefore, I recommended cholecystectomy to remove the pathology.  I explained laparoscopic techniques with possible need for an open approach.  Probable cholangiogram to evaluate the bilary tract was explained as well.    Risks such as bleeding, infection, abscess, leak, injury to other organs, need for further treatment, heart attack, death, and other risks were discussed.  I noted a good likelihood this will help address the problem.  Possibility that this will not correct all abdominal symptoms was explained.  Goals of post-operative recovery were discussed as well.    OR FINDINGS: filling defects on cholangiogram consistent with choledocholithiasis   DESCRIPTION:   The patient was identified & brought into  the operating room. The patient was positioned supine with arms tucked. SCDs were active during the entire case. The patient underwent general anesthesia without any difficulty.  The abdomen was prepped and draped in a sterile fashion. A Surgical Timeout was performed and confirmed our plan.  We positioned the patient in reverse [place] & right side up.  I placed a Hassan laparoscopic port through the umbilicus using open entry technique.  Entry was clean. There were no adhesions to the anterior abdominal wall supraumbilically.  We induced carbon dioxide insufflation. Camera inspection revealed no injury.    I proceeded to continue with laparoscopic technique. I placed a 5 mm port in mid subcostal region, another 5mm port in the right flank near the anterior axillary line, and a 5mm port in the left subxiphoid region obliquely within the falciform ligament.  I bluntly mobilized omental adhesions to the abd wall from her last surgery.  I turned attention to the right upper quadrant.  The gallbladder fundus was elevated cephalad. I used cautery and blunt dissection to free the peritoneal coverings between the gallbladder and the liver on the posteriolateral and anteriomedial walls.   I used careful blunt and cautery dissection with a maryland dissector to help get a good critical view of the cystic artery and cystic duct. I did further dissection to free a few centimeters of the  gallbladder off the liver bed to get a good critical view of the infundibulum and cystic duct. I mobilized the cystic artery.  I skeletonized the cystic duct.  After getting a good 360 view, I decided to perform a cholangiogram.  I placed a clip on the infundibulum.   I did a partial cystic  duct-otomy and ensured patency. I placed a 5 F cholangiocatheter through a puncture site at the right subcostal ridge of the abdominal wall and directed it into the cystic duct.  This was secured with a clip. We ran a cholangiogram with dilute  radio-opaque contrast and continuous fluoroscopy.  Contrast flowed from a side branch consistent with cystic duct cannulization. Contrast flowed up the common hepatic duct into the right and left intrahepatic chains out to secondary radicals. Contrast flowed down the common bile duct easily across the normal ampulla into the duodenum.  There were at least 2 small filling defects in the proper hepatic duct and right hepatic duct consistent with stones.  I repeated the cholangiogram with similar results.  I removed the cholangiocatheter.  This was consistent with non-obstructing choledocholithiasis.    I placed clips on the cystic duct x3.  I completed cystic duct transection.   I placed clips on the cystic artery x3 with 2 proximally.  I ligated the cystic artery using scissors. I freed the gallbladder from its remaining attachments to the liver. I ensured hemostasis on the gallbladder fossa of the liver and elsewhere. I inspected the rest of the abdomen & detected no injury nor bleeding elsewhere.  I irrigated the RUQ with normal saline.  I removed the gallbladder through the umbilical port site.  I closed the umbilical fascia using 0 Vicryl stitches.   I closed the skin using 4-0 vicryl stitch.  Sterile dressings were applied. The patient was extubated & arrived in the PACU in stable condition.  I had discussed postoperative care with the patient in the holding area.   I will discuss  operative findings and postoperative goals / instructions with the patient once she recovers from her anesthesia.  Instructions are written in the chart as well.

## 2016-02-27 NOTE — Anesthesia Procedure Notes (Signed)
Procedure Name: Intubation Date/Time: 02/27/2016 1:28 PM Performed by: Enriqueta ShutterWILLIFORD, Taiyana Kissler D Pre-anesthesia Checklist: Patient identified, Emergency Drugs available, Suction available and Patient being monitored Patient Re-evaluated:Patient Re-evaluated prior to inductionOxygen Delivery Method: Circle System Utilized Preoxygenation: Pre-oxygenation with 100% oxygen Intubation Type: IV induction, Rapid sequence and Cricoid Pressure applied Ventilation: Mask ventilation without difficulty Laryngoscope Size: Mac and 4 Grade View: Grade I Tube type: Oral Tube size: 7.0 mm Number of attempts: 1 Airway Equipment and Method: Stylet and Oral airway Placement Confirmation: ETT inserted through vocal cords under direct vision,  positive ETCO2 and breath sounds checked- equal and bilateral Secured at: 21 cm Tube secured with: Tape Dental Injury: Teeth and Oropharynx as per pre-operative assessment

## 2016-02-27 NOTE — H&P (Addendum)
[facility] History and Physical  Carol Duncan WUJ:[id number] DOB: 1951-12-01 DOA: 02/26/2016  Referring physician: EDP PCP: Pcp Not In System   Chief Complaint: Abdominal pain   HPI: Carol Duncan is a 65 y.o. female with h/o HTN, anxiety, presumably BPD as she is on chronic lithium.  Patient presents to the ED with c/o onset this morning of pain in her epigastric area.  Pain is severe, aching, sharp.  Waxing and waning.  No radiation.  Has had several episodes of vomiting PTA.  No diarrhea.  Patient still has her gallbladder.  Review of Systems: Systems reviewed.  As above, otherwise negative  Past Medical History  Diagnosis Date  . Hypertension   . Anxiety   . Diverticulitis   . Complication of anesthesia     Takes alot to keep patient under anesthesia  . Bile duct abnormality    Past Surgical History  Procedure Laterality Date  . Abdominal hysterectomy    . Myomectomy    . Appendectomy      during hysterectomy   Social History:  reports that she has never smoked. She has never used smokeless tobacco. She reports that she drinks alcohol. She reports that she does not use illicit drugs.  Allergies  Allergen Reactions  . Shellfish Allergy     Shrimp ok, Scallops, clams, oysters give angioedema  . Wheat Bran Other (See Comments)    Upset stomach    Family History  Problem Relation Age of Onset  . Brain cancer Mother   . Heart attack Father      Prior to Admission medications   Medication Sig Start Date End Date Taking? Authorizing Provider  ALPRAZolam Prudy Feeler) 1 MG tablet Take 0.5 mg by mouth 3 (three) times daily as needed for anxiety.    Yes Historical Provider, MD  Biotin 5000 MCG CAPS Take 10,000 mcg by mouth daily.    Yes Historical Provider, MD  gabapentin (NEURONTIN) 300 MG capsule Take 300-600 mg by mouth at bedtime. Take s daily at bedtime for 7 days then take s daily 02/17/16  Yes Historical Provider, MD   HYDROcodone-acetaminophen (NORCO) 10-325 MG per tablet Take 1-2 tablets by mouth every 6 (six) hours as needed for severe pain (takes one-half tablet).    Yes Historical Provider, MD  hyoscyamine (LEVSIN/SL) 0.125 MG SL tablet Place 1 tablet (0.125 mg total) under the tongue every 4 (four) hours as needed for cramping. 04/03/12 02/26/16 Yes Shari Upstill, PA-C  lamoTRIgine (LAMICTAL) 200 MG tablet Take 200 mg by mouth every morning.   Yes Historical Provider, MD  LINZESS 145 MCG CAPS capsule Take 145 mg by mouth daily. 12/27/15  Yes Historical Provider, MD  lithium carbonate 150 MG capsule Take 150 mg by mouth daily. 02/20/16  Yes Historical Provider, MD  ondansetron (ZOFRAN) 8 MG tablet Take 8 mg by mouth every 8 (eight) hours as needed for nausea or vomiting.  08/08/15  Yes Historical Provider, MD  pramipexole (MIRAPEX) 0.5 MG tablet Take 1.5 mg by mouth at bedtime as needed (for sleep).    Yes Historical Provider, MD  thyroid (ARMOUR) 30 MG tablet Take 30 mg by mouth 2 (two) times daily.   Yes Historical Provider, MD  traMADol (ULTRAM) 50 MG tablet Take 100 mg by mouth 2 (two) times daily as needed for moderate pain.  01/31/16  Yes Historical Provider, MD  valACYclovir (VALTREX) 500 MG tablet Take 500 mg by mouth 2 (two) times daily. As needed for outbreaks 09/23/15  Yes Historical Provider, MD   Physical Exam: Filed Vitals:   02/26/16 2000 02/26/16 2354  BP: 198/97 193/90  Pulse: 69 64  Temp:    Resp: 15 15    BP 193/90 mmHg  Pulse 64  Temp(Src) 98.1 F (36.7 C) (Oral)  Resp 15  SpO2 100%  General Appearance:    Alert, oriented, no distress, appears stated age  Head:    Normocephalic, atraumatic  Eyes:    PERRL, EOMI, sclera non-icteric        Nose:   Nares without drainage or epistaxis. Mucosa, turbinates normal  Throat:   Moist mucous membranes. Oropharynx without erythema or exudate.  Neck:   Supple. No carotid bruits.  No thyromegaly.  No lymphadenopathy.   Back:     No CVA  tenderness, no spinal tenderness  Lungs:     Clear to auscultation bilaterally, without wheezes, rhonchi or rales  Chest wall:    No tenderness to palpitation  Heart:    Regular rate and rhythm without murmurs, gallops, rubs  Abdomen:     Soft, non-tender, nondistended, normal bowel sounds, no organomegaly  Genitalia:    deferred  Rectal:    deferred  Extremities:   No clubbing, cyanosis or edema.  Pulses:   2+ and symmetric all extremities  Skin:   Skin color, texture, turgor normal, no rashes or lesions  Lymph nodes:   Cervical, supraclavicular, and axillary nodes normal  Neurologic:   CNII-XII intact. Normal strength, sensation and reflexes      throughout    Labs on Admission:  Basic Metabolic Panel:  Recent Labs Lab 02/26/16 2102  NA 139  K 3.9  CL 100*  CO2 28  GLUCOSE 142*  BUN 12  CREATININE 0.73  CALCIUM 9.7   Liver Function Tests:  Recent Labs Lab 02/26/16 2102  AST 21  ALT 18  ALKPHOS 60  BILITOT 0.6  PROT 7.6  ALBUMIN 4.2    Recent Labs Lab 02/26/16 2102  LIPASE 78*   No results for input(s): AMMONIA in the last 168 hours. CBC:  Recent Labs Lab 02/26/16 2102  WBC 9.1  HGB 14.2  HCT 42.4  MCV 94.0  PLT 215   Cardiac Enzymes: No results for input(s): CKTOTAL, CKMB, CKMBINDEX, TROPONINI in the last 168 hours.  BNP (last 3 results) No results for input(s): PROBNP in the last 8760 hours. CBG: No results for input(s): GLUCAP in the last 168 hours.  Radiological Exams on Admission: Dg Chest 2 View  02/26/2016  CLINICAL DATA:  Epigastric chest pain EXAM: CHEST  2 VIEW COMPARISON:  None. FINDINGS: Hyperexpansion is consistent with emphysema. The lungs are clear wiithout focal pneumonia, edema, pneumothorax or pleural effusion. 4-5 mm pulmonary nodule identified in the left mid lung. The cardio pericardial silhouette is enlarged. Old right clavicle fracture noted. Telemetry leads overlie the chest. IMPRESSION: 1. Cardiomegaly with emphysema. 2.  4 mm left lung nodule. CT chest without contrast recommended to further evaluate. Electronically Signed   By: Kennith Center M.D.   On: 02/26/2016 21:15   US Abdomen Limited Ruq  02/26/2016  CLINICAL DATA:  Upper abdominal pain, history of gallstones. EXAM: US ABDOMEN LIMITED - RIGHT UPPER QUADRANT COMPARISON:  CT abdomen and pelvis September 24, 2015 FINDINGS: Gallbladder: 7 mm echogenic gallstone, with acoustic shadowing. No gallbladder wall thickening or pericholecystic fluid. No sonographic Murphy's sign elicited. Common bile duct: Diameter: 3-4 mm Liver: No focal lesion identified. Within normal limits in parenchymal echogenicity. Hepatopetal portal  vein. IMPRESSION: Cholelithiasis without sonographic findings of acute cholecystitis. Electronically Signed   By: Awilda Metroourtnay  Bloomer M.D.   On: 02/26/2016 23:14    EKG: Independently reviewed.  Assessment/Plan Principal Problem:   Epigastric abdominal pain Active Problems:   Cholelithiasis   Hypertension   Biliary colic   1. Epigastric abdominal pain 1. US shows cholelithiasis without evidence of cholecystitis, no evidence of bile duct dilatation 2. Lipase is 73 3. LFTs are normal 4. Dr. Daphine DeutscherMartin was consulted for possible biliary colic, says he wants medicine to admit and he will consult in AM 5. Doubt surgical procedure will end up being done tomorrow at this point, so will go ahead and let patient have clear liquids and lovenox for DVT ppx 6. Will give gentle hydration 2. HTN - 1. Getting hydralazine acutely in ED 2. Will start patient off on minimum dose of norvasc.    Code Status: Full  Family Communication: No family in room Disposition Plan: Admit to inpatient   Time spent: 70 min  Carol Duncan M. [facility] Pager [phone]  If 7AM-7PM, please contact the day team taking care of the patient [email or web address] Password [email or web address] 02/27/2016, 12:49 AM

## 2016-02-27 NOTE — Anesthesia Preprocedure Evaluation (Addendum)
Anesthesia Evaluation  Patient identified by MRN, date of birth, ID band Patient awake    Reviewed: Allergy & Precautions, NPO status , Patient's Chart, lab work & pertinent test results  History of Anesthesia Complications Negative for: history of anesthetic complications  Airway Mallampati: II  TM Distance: >3 FB Neck ROM: Full    Dental no notable dental hx. (+) Dental Advisory Given   Pulmonary neg pulmonary ROS,    Pulmonary exam normal breath sounds clear to auscultation       Cardiovascular hypertension, Pt. on medications Normal cardiovascular exam Rhythm:Regular Rate:Normal     Neuro/Psych PSYCHIATRIC DISORDERS Anxiety Bipolar Disorder negative neurological ROS     GI/Hepatic negative GI ROS, Neg liver ROS,   Endo/Other  negative endocrine ROS  Renal/GU negative Renal ROS  negative genitourinary   Musculoskeletal negative musculoskeletal ROS (+)   Abdominal   Peds negative pediatric ROS (+)  Hematology negative hematology ROS (+)   Anesthesia Other Findings   Reproductive/Obstetrics negative OB ROS                           Anesthesia Physical Anesthesia Plan  ASA: II  Anesthesia Plan: General   Post-op Pain Management:    Induction: Intravenous  Airway Management Planned: Oral ETT  Additional Equipment:   Intra-op Plan:   Post-operative Plan: Extubation in OR  Informed Consent: I have reviewed the patients History and Physical, chart, labs and discussed the procedure including the risks, benefits and alternatives for the proposed anesthesia with the patient or authorized representative who has indicated his/her understanding and acceptance.   Dental advisory given  Plan Discussed with: CRNA  Anesthesia Plan Comments:         Anesthesia Quick Evaluation

## 2016-02-27 NOTE — Transfer of Care (Signed)
Immediate Anesthesia Transfer of Care Note  Patient: Carol Duncan  Procedure(s) Performed: Procedure(s): LAPAROSCOPIC CHOLECYSTECTOMY WITH INTRAOPERATIVE CHOLANGIOGRAM (N/A)  Patient Location: PACU  Anesthesia Type:General  Level of Consciousness: awake, alert  and oriented  Airway & Oxygen Therapy: Patient Spontanous Breathing and Patient connected to face mask oxygen  Post-op Assessment: Report given to RN and Post -op Vital signs reviewed and stable  Post vital signs: Reviewed and stable  Last Vitals:  Filed Vitals:   02/27/16 0510 02/27/16 0626  BP: 188/81 160/90  Pulse: 76   Temp: 36.8 C   Resp: 16     Complications: No apparent anesthesia complications

## 2016-02-27 NOTE — Progress Notes (Signed)
PROGRESS NOTE    Carol Duncan  ZOX:[id number]  DOB: 1951-12-16  DOA: 02/26/2016 PCP: Pearla Dubonnet, MD Outpatient Specialists: Psychiatry: Dr. Andreas Ohm course: 65 year old female patient with history of bipolar 2 disorder on lithium and follows with outpatient psychiatry, HTN, hypothyroid, presented to ED on 02/26/16 with complaints of epigastric abdominal pain and nonbloody emesis. In the ED, she was afebrile, no leukocytosis, mild lipase elevation and ultrasound abdomen showed gallstones without features of acute cholecystitis. She's had 2 similar hospitalizations in the past for abdominal pain. General surgery consulted for evaluation.   Assessment & Plan:   Abdominal pain - Etiology:? Symptomatic gallstones. - Patient denies history of tobacco or alcohol use, NSAID use, gastritis/GERD/PUD. No history of eating out or sick contacts with similar complaints. - Minimally elevated lipase of no clear significance and has normalized. - Gen. surgery input appreciated and feels that her presentation is due to symptomatic gallstones and plan cholecystectomy later today.  Essential hypertension - On no medications at home. Now may be worsened related to pain. - Continue amlodipine for now and when necessary IV hydralazine. - Follow closely to determine meds at discharge.  Hypothyroid - Continue Armour Thyroid.  Hypokalemia/hypomagnesemia - Replace and follow.  Bipolar 2 disorder - Lithium levels low. Continue lithium and outpatient follow-up with psychiatry. Stable.  Fibromyalgia - On gabapentin and Lamictal as outpatient.  2.4 mm left lung nodule on CXR - Recommend OP follow up with PCP for further evaluation i.e CT chest. Discussed with patient and she verbalizes understanding.  DVT prophylaxis: Lovenox Code Status: Full Family Communication: None at bedside Disposition Plan: DC home when medically stable, possibly  3/14.   Consultants:  Surgery  Procedures:  None  Antimicrobials:  None   Subjective: States that she feels better. No vomiting since admission. Intermittent epigastric abdominal pain with radiation across both upper quadrants, rates 5/10, dull aching. Denies SI/HI/Audiovisual hallucinations.  Objective: Filed Vitals:   02/27/16 0030 02/27/16 0113 02/27/16 0510 02/27/16 0626  BP: 170/85 181/84 188/81 160/90  Pulse:  80 76   Temp:  98.5 F (36.9 C) 98.3 F (36.8 C)   TempSrc:  Oral Oral   Resp: Height:     (1.575 m)  Weight:    58.06 kg (128 lb)  SpO2:  100% 100%     Intake/Output Summary (Last 24 hours) at 02/27/16 1040 Last data filed at 02/27/16 0941  Gross per 24 hour  Intake  437.5 ml  Output   2350 ml  Net -1912.5 ml   Filed Weights   02/27/16 0626  Weight: 58.06 kg (128 lb)    Exam:  General exam: Pleasant middle aged female lying comfortably propped up in bed.  Respiratory system: Clear. No increased work of breathing. Cardiovascular system: S1 & S2 heard, RRR. No JVD, murmurs, gallops, clicks or pedal edema. Gastrointestinal system: Abdomen is nondistended, soft. Mild epigastric tenderness without peritoneal signs. Normal bowel sounds heard. Central nervous system: Alert and oriented. No focal neurological deficits. Extremities: Symmetric 5 x 5 power. Psychiatry: pleasant and appropriate.   Data Reviewed: Basic Metabolic Panel:  Recent Labs Lab 02/26/16 2102 02/27/16 0359 02/27/16 0515  NA 139 138  --   K 3.9 3.0*  --   CL 100* 99*  --   CO2 28 26  --   GLUCOSE 142* 155*  --   BUN 12 9  --   CREATININE 0.73 0.57  --   CALCIUM  9.7 9.0  --   MG  --   --  1.5*   Liver Function Tests:  Recent Labs Lab 02/26/16 2102 02/27/16 0359  AST 21 22  ALT 18 17  ALKPHOS 60 60  BILITOT 0.6 0.7  PROT 7.6 7.1  ALBUMIN 4.2 4.1    Recent Labs Lab 02/26/16 2102 02/27/16 0359  LIPASE 78* 29   No results for input(s):  AMMONIA in the last 168 hours. CBC:  Recent Labs Lab 02/26/16 2102 02/27/16 0359  WBC 9.1 8.7  HGB 14.2 14.6  HCT 42.4 42.6  MCV 94.0 94.0  PLT 215 238   Cardiac Enzymes: No results for input(s): CKTOTAL, CKMB, CKMBINDEX, TROPONINI in the last 168 hours. BNP (last 3 results) No results for input(s): PROBNP in the last 8760 hours. CBG: No results for input(s): GLUCAP in the last 168 hours.  No results found for this or any previous visit (from the past 240 hour(s)).       Studies: Dg Chest 2 View  02/26/2016  CLINICAL DATA:  Epigastric chest pain EXAM: CHEST  2 VIEW COMPARISON:  None. FINDINGS: Hyperexpansion is consistent with emphysema. The lungs are clear wiithout focal pneumonia, edema, pneumothorax or pleural effusion. 4-5 mm pulmonary nodule identified in the left mid lung. The cardio pericardial silhouette is enlarged. Old right clavicle fracture noted. Telemetry leads overlie the chest. IMPRESSION: 1. Cardiomegaly with emphysema. 2. 4 mm left lung nodule. CT chest without contrast recommended to further evaluate. Electronically Signed   By: Kennith CenterEric  Mansell M.D.   On: 02/26/2016 21:15   [place] Abdomen Limited Ruq  02/26/2016  CLINICAL DATA:  Upper abdominal pain, history of gallstones. EXAM: US ABDOMEN LIMITED - RIGHT UPPER QUADRANT COMPARISON:  CT abdomen and pelvis September 24, 2015 FINDINGS: Gallbladder: 7 mm echogenic gallstone, with acoustic shadowing. No gallbladder wall thickening or pericholecystic fluid. No sonographic Murphy's sign elicited. Common bile duct: Diameter: 3-4 mm Liver: No focal lesion identified. Within normal limits in parenchymal echogenicity. Hepatopetal portal vein. IMPRESSION: Cholelithiasis without sonographic findings of acute cholecystitis. Electronically Signed   By: Awilda Metroourtnay  Bloomer M.D.   On: 02/26/2016 23:14        Scheduled Meds: . amLODipine  2.5 mg Oral Daily  . cefTRIAXone (ROCEPHIN)  IV  2 g Intravenous On Call to OR  . enoxaparin  (LOVENOX) injection  40 mg Subcutaneous Q24H  . gabapentin  300 mg Oral QHS   Followed by  . [START ON 03/01/2016] gabapentin  600 mg Oral QHS  . lamoTRIgine  200 mg Oral Daily  . Linaclotide  145 mcg Oral Daily  . lithium carbonate  150 mg Oral Daily  . magnesium sulfate 1 - 4 g bolus IVPB  2 g Intravenous Once  . thyroid  30 mg Oral BID  . valACYclovir  500 mg Oral BID   Continuous Infusions: . 0.9 % NaCl with KCl 40 mEq / L      Principal Problem:   Epigastric abdominal pain Active Problems:   Cholelithiasis   Hypertension   Biliary colic    Time spent: 30 minutes.    Marcellus ScottHONGALGI,Hart Haas, MD, FACP, FHM. [facility] Pager [phone]  If 7PM-7AM, please contact night-coverage [email or web address] Password [email or web address] 02/27/2016, 10:40 AM    LOS: 0 days

## 2016-02-27 NOTE — Consult Note (Signed)
Reason for Consult: cholelithiasis with epigastric pain Referring Physician: Tera Partridge PCP:  Josetta Huddle, MD; Sanda Klein NP   Carol Duncan is an 65 y.o. female.  HPI:  Pt presents to the Ed with epigastric pain, vomiting. She was afebrile, hypertensive, K+ 3.0, lipase was 78 last PM, normal WBC.  CXR shows cardiomegaly with emphysema, 4 mm left lung nodule.  Ultrasound shows 7 mm gallstone, with wall thickening, no pericholecystic fluid, CBD 3-4 mm. Cholelithiasis without cholecystitis.  Labs this AM again shows normal WBC and LFT's. She was admitted back on 12/01/13 by Dr. Lucia Gaskins for this and she left AMA.  She was seen again on 12/16'14 in the ED with similar symptoms.  She had some small gallstones but otherwise she was normal. Currently she still has tenderness Right mid upper quadrant.  Her symptoms with constipation include lower abdominal pain We are ask to see.  Past Medical History  Diagnosis Date  Bipolar disorder   Hypothyroid   Fibromyalgia   Chronic constipation   Hypertension   Anxiety   Diverticulitis   Complication of anesthesia    Takes alot to keep patient under anesthesia  Bile duct abnormality     Past Surgical History  Procedure Laterality Date  . Abdominal hysterectomy    . Myomectomy    . Appendectomy      during hysterectomy    Family History  Problem Relation Age of Onset  . Brain cancer Mother   . Heart attack Father     Social History:  reports that she has never smoked. She has never used smokeless tobacco. She reports that she drinks alcohol. She reports that she does not use illicit drugs.  Allergies:  Allergies  Allergen Reactions  . Shellfish Allergy     Shrimp ok, Scallops, clams, oysters give angioedema  . Wheat Bran Other (See Comments)    Upset stomach    Medications:  Prior to Admission:  Prescriptions prior to admission  Medication Sig Dispense Refill Last Dose  . ALPRAZolam (XANAX) 1 MG tablet Take 0.5 mg by  mouth 3 (three) times daily as needed for anxiety.    02/26/2016 at Unknown time  . Biotin 5000 MCG CAPS Take 10,000 mcg by mouth daily.    02/25/2016 at Unknown time  . gabapentin (NEURONTIN) 300 MG capsule Take 300-600 mg by mouth at bedtime. Take 3107ms daily at bedtime for 7 days then take 6075m daily  0 02/25/2016 at Unknown time  . HYDROcodone-acetaminophen (NORCO) 10-325 MG per tablet Take 1-2 tablets by mouth every 6 (six) hours as needed for severe pain (takes one-half tablet).    02/26/2016 at Unknown time  . hyoscyamine (LEVSIN/SL) 0.125 MG SL tablet Place 1 tablet (0.125 mg total) under the tongue every 4 (four) hours as needed for cramping. 12 tablet 0 Past Month at Unknown time  . lamoTRIgine (LAMICTAL) 200 MG tablet Take 200 mg by mouth every morning.   02/26/2016 at Unknown time  . LINZESS 145 MCG CAPS capsule Take 145 mg by mouth daily.   02/26/2016 at Unknown time  . lithium carbonate 150 MG capsule Take 150 mg by mouth daily.  0 02/26/2016 at Unknown time  . ondansetron (ZOFRAN) 8 MG tablet Take 8 mg by mouth every 8 (eight) hours as needed for nausea or vomiting.    02/26/2016 at Unknown time  . pramipexole (MIRAPEX) 0.5 MG tablet Take 1.5 mg by mouth at bedtime as needed (for sleep).    02/25/2016 at Unknown  time  . thyroid (ARMOUR) 30 MG tablet Take 30 mg by mouth 2 (two) times daily.   02/26/2016 at Unknown time  . traMADol (ULTRAM) 50 MG tablet Take 100 mg by mouth 2 (two) times daily as needed for moderate pain.   0 02/25/2016 at Unknown time  . valACYclovir (VALTREX) 500 MG tablet Take 500 mg by mouth 2 (two) times daily. As needed for outbreaks   02/26/2016 at Unknown time   Scheduled: . sodium chloride   Intravenous STAT  . amLODipine  2.5 mg Oral Daily  . enoxaparin (LOVENOX) injection  40 mg Subcutaneous Q24H  . gabapentin  300 mg Oral QHS   Followed by  . [START ON 03/01/2016] gabapentin  600 mg Oral QHS  . lamoTRIgine  200 mg Oral Daily  . Linaclotide  145 mcg Oral Daily  .  lithium carbonate  150 mg Oral Daily  . potassium chloride  30 mEq Oral Q4H  . thyroid  30 mg Oral BID  . valACYclovir  500 mg Oral BID   Continuous: . sodium chloride Stopped (02/26/16 2354)  . sodium chloride 125 mL/hr at 02/27/16 0230   ATF:TDDUKGURKY, HYDROmorphone (DILAUDID) injection, ondansetron, pramipexole, traMADol Anti-infectives    Start     Dose/Rate Route Frequency Ordered Stop   02/27/16 0045  valACYclovir (VALTREX) tablet 500 mg     500 mg Oral 2 times daily 02/27/16 0031        Results for orders placed or performed during the hospital encounter of 02/26/16 (from the past 48 hour(s))  Urinalysis, Routine w reflex microscopic (not at [facility])     Status: Abnormal   Collection Time: 02/26/16  8:36 PM  Result Value Ref Range   Color, Urine YELLOW YELLOW   APPearance TURBID (A) CLEAR   Specific Gravity, Urine 1.014 1.005 - 1.030   pH 8.0 5.0 - 8.0   Glucose, UA NEGATIVE NEGATIVE mg/dL   Hgb urine dipstick LARGE (A) NEGATIVE   Bilirubin Urine NEGATIVE NEGATIVE   Ketones, ur NEGATIVE NEGATIVE mg/dL   Protein, ur 30 (A) NEGATIVE mg/dL   Nitrite NEGATIVE NEGATIVE   Leukocytes, UA NEGATIVE NEGATIVE  Urine microscopic-add on     Status: Abnormal   Collection Time: 02/26/16  8:36 PM  Result Value Ref Range   Squamous Epithelial / LPF 0-5 (A) NONE SEEN   WBC, UA 0-5 0 - 5 WBC/hpf   RBC / HPF TOO NUMEROUS TO COUNT 0 - 5 RBC/hpf   Bacteria, UA RARE (A) NONE SEEN  Lipase, blood     Status: Abnormal   Collection Time: 02/26/16  9:02 PM  Result Value Ref Range   Lipase 78 (H) 11 - 51 U/L  Comprehensive metabolic panel     Status: Abnormal   Collection Time: 02/26/16  9:02 PM  Result Value Ref Range   Sodium 139 135 - 145 mmol/L   Potassium 3.9 3.5 - 5.1 mmol/L   Chloride 100 (L) 101 - 111 mmol/L   CO2 28 22 - 32 mmol/L   Glucose, Bld 142 (H) 65 - 99 mg/dL   BUN 12 6 - 20 mg/dL   Creatinine, Ser 0.73 0.44 - 1.00 mg/dL   Calcium 9.7 8.9 - 10.3 mg/dL   Total Protein  7.6 6.5 - 8.1 g/dL   Albumin 4.2 3.5 - 5.0 g/dL   AST 21 15 - 41 U/L   ALT 18 14 - 54 U/L   Alkaline Phosphatase 60 38 - 126 U/L   Total  Bilirubin 0.6 0.3 - 1.2 mg/dL   GFR calc non Af Amer >60 >60 mL/min   GFR calc Af Amer >60 >60 mL/min    Comment: (NOTE) The eGFR has been calculated using the CKD EPI equation. This calculation has not been validated in all clinical situations. eGFR's persistently <60 mL/min signify possible Chronic Kidney Disease.    Anion gap 11 5 - 15  CBC     Status: None   Collection Time: 02/26/16  9:02 PM  Result Value Ref Range   WBC 9.1 4.0 - 10.5 K/uL   RBC 4.51 3.87 - 5.11 MIL/uL   Hemoglobin 14.2 12.0 - 15.0 g/dL   HCT 42.4 36.0 - 46.0 %   MCV 94.0 78.0 - 100.0 fL   MCH 31.5 26.0 - 34.0 pg   MCHC 33.5 30.0 - 36.0 g/dL   RDW 12.8 11.5 - 15.5 %   Platelets 215 150 - 400 K/uL  I-stat troponin, ED (not at [facility], [facility])     Status: None   Collection Time: 02/26/16  9:05 PM  Result Value Ref Range   Troponin i, poc 0.00 0.00 - 0.08 ng/mL   Comment 3            Comment: Due to the release kinetics of cTnI, a negative result within the first hours of the onset of symptoms does not rule out myocardial infarction with certainty. If myocardial infarction is still suspected, repeat the test at appropriate intervals.   Lithium level     Status: Abnormal   Collection Time: 02/26/16 11:56 PM  Result Value Ref Range   Lithium Lvl <0.06 (L) 0.60 - 1.20 mmol/L  CBC     Status: None   Collection Time: 02/27/16  3:59 AM  Result Value Ref Range   WBC 8.7 4.0 - 10.5 K/uL   RBC 4.53 3.87 - 5.11 MIL/uL   Hemoglobin 14.6 12.0 - 15.0 g/dL   HCT 42.6 36.0 - 46.0 %   MCV 94.0 78.0 - 100.0 fL   MCH 32.2 26.0 - 34.0 pg   MCHC 34.3 30.0 - 36.0 g/dL   RDW 12.9 11.5 - 15.5 %   Platelets 238 150 - 400 K/uL  Comprehensive metabolic panel     Status: Abnormal   Collection Time: 02/27/16  3:59 AM  Result Value Ref Range   Sodium 138 135 - 145 mmol/L   Potassium 3.0  (L) 3.5 - 5.1 mmol/L    Comment: DELTA CHECK NOTED REPEATED TO VERIFY    Chloride 99 (L) 101 - 111 mmol/L   CO2 26 22 - 32 mmol/L   Glucose, Bld 155 (H) 65 - 99 mg/dL   BUN 9 6 - 20 mg/dL   Creatinine, Ser 0.57 0.44 - 1.00 mg/dL   Calcium 9.0 8.9 - 10.3 mg/dL   Total Protein 7.1 6.5 - 8.1 g/dL   Albumin 4.1 3.5 - 5.0 g/dL   AST 22 15 - 41 U/L   ALT 17 14 - 54 U/L   Alkaline Phosphatase 60 38 - 126 U/L   Total Bilirubin 0.7 0.3 - 1.2 mg/dL   GFR calc non Af Amer >60 >60 mL/min   GFR calc Af Amer >60 >60 mL/min    Comment: (NOTE) The eGFR has been calculated using the CKD EPI equation. This calculation has not been validated in all clinical situations. eGFR's persistently <60 mL/min signify possible Chronic Kidney Disease.    Anion gap 13 5 - 15    Dg Chest  2 View  02/26/2016  CLINICAL DATA:  Epigastric chest pain EXAM: CHEST  2 VIEW COMPARISON:  None. FINDINGS: Hyperexpansion is consistent with emphysema. The lungs are clear wiithout focal pneumonia, edema, pneumothorax or pleural effusion. 4-5 mm pulmonary nodule identified in the left mid lung. The cardio pericardial silhouette is enlarged. Old right clavicle fracture noted. Telemetry leads overlie the chest. IMPRESSION: 1. Cardiomegaly with emphysema. 2. 4 mm left lung nodule. CT chest without contrast recommended to further evaluate. Electronically Signed   By: Misty Stanley M.D.   On: 02/26/2016 21:15   US Abdomen Limited Ruq  02/26/2016  CLINICAL DATA:  Upper abdominal pain, history of gallstones. EXAM: US ABDOMEN LIMITED - RIGHT UPPER QUADRANT COMPARISON:  CT abdomen and pelvis September 24, 2015 FINDINGS: Gallbladder: 7 mm echogenic gallstone, with acoustic shadowing. No gallbladder wall thickening or pericholecystic fluid. No sonographic Murphy's sign elicited. Common bile duct: Diameter: 3-4 mm Liver: No focal lesion identified. Within normal limits in parenchymal echogenicity. Hepatopetal portal vein. IMPRESSION: Cholelithiasis  without sonographic findings of acute cholecystitis. Electronically Signed   By: Elon Alas M.D.   On: 02/26/2016 23:14    Review of Systems  Constitutional: Negative.   HENT: Negative.   Eyes: Negative.   Respiratory: Negative.   Cardiovascular: Negative.   Gastrointestinal: Positive for nausea, vomiting, abdominal pain and constipation (chronic.  No Bm for 2 day, sx with constipation is normally lower abdominal pain.). Negative for heartburn and diarrhea.  Genitourinary: Negative.   Musculoskeletal:       Arthritis right hand She reports Fibromyalgia Hips, back, and arms  Skin: Negative.   Neurological: Negative.   Endo/Heme/Allergies: Negative.   Psychiatric/Behavioral: Positive for depression. The patient is nervous/anxious.        She reports being Bipolar and is doing well currently.   Blood pressure 160/90, pulse 76, temperature 98.3 F (36.8 C), temperature source Oral, resp. rate 16, height 5' 2"  (1.575 m), weight 58.06 kg (128 lb), SpO2 100 %. Physical Exam  Constitutional: She is oriented to person, place, and time. She appears well-developed. No distress.  Thin cachectic female very anxious, first said he had a point on her upper lip, then reported mid abdominal pain that started yesterday and has gotten worse with nausea and vomiting.  HENT:  Head: Normocephalic and atraumatic.  Nose: Nose normal.  Eyes: Right eye exhibits no discharge. Left eye exhibits no discharge. No scleral icterus.  Neck: No JVD present. No tracheal deviation present. No thyromegaly present.  Cardiovascular: Normal rate, regular rhythm, normal heart sounds and intact distal pulses.   No murmur heard. Respiratory: Effort normal and breath sounds normal. No respiratory distress. She has no wheezes. She has no rales. She exhibits no tenderness.  GI: Soft. Bowel sounds are normal. She exhibits no distension and no mass. There is tenderness (mid right abdomen ). There is no rebound and no  guarding.  Musculoskeletal: She exhibits no edema.  Lymphadenopathy:    She has no cervical adenopathy.  Neurological: She is alert and oriented to person, place, and time. No cranial nerve deficit.  Skin: Skin is warm and dry. No rash noted. She is not diaphoretic. No erythema. No pallor.  Psychiatric: Thought content normal.  Very anxious, at first she told me there was a spot on her upper lip that bothered her.  Then she told me the abdominal pain, nausea and vomiting was her issue.  She feels her Bipolar condition is OK    Assessment/Plan: Epigastric pain, with  cholelithiasis/pancreatitis Normal LFT, mildly elevated lipase last PM, I will recheck this AM Bipolar Disorder Fibromyalgia Hypothyroid Chronic constipation   Plan:  Recheck lipase from this AM.  She has small stones, and when Dr Lucia Gaskins saw her in 11/2013, it was his opinion she had passed a stone.  Bilirubin was up at that time was elevated 2.4, as were AST/ALT; 158/131; Marland Kitchen  Lipase was 103 at that time.  She signed out AMA, and nothing further was done.  Lipase was 78 last PM.  Her WBC was 14.1 in 2014 evaluation.  I will discuss with Dr. Marcello Moores, she may be passing these small stones as Dr. Lucia Gaskins thought 2 years ago.  I would keep her NPO till we have more info.  She does need her K+ replaced.  I discussed this with Dr. Marcello Moores.  Lipase is back down to 29.  I will replace K+ in IV, Mag was 1.5, I will also replace that and we will try to do cholecystectomy later today.  She says her pain is no better than yesterday right now.    She is NPO, i did allow the staff to give her AM meds with sips.    Carol Duncan 02/27/2016, 7:28 AM

## 2016-02-28 DIAGNOSIS — I10 Essential (primary) hypertension: Secondary | ICD-10-CM

## 2016-02-28 DIAGNOSIS — E876 Hypokalemia: Secondary | ICD-10-CM | POA: Insufficient documentation

## 2016-02-28 DIAGNOSIS — K8020 Calculus of gallbladder without cholecystitis without obstruction: Secondary | ICD-10-CM | POA: Insufficient documentation

## 2016-02-28 LAB — COMPREHENSIVE METABOLIC PANEL
ALT: 22 U/L (ref 14–54)
AST: 24 U/L (ref 15–41)
Albumin: 3.2 g/dL — ABNORMAL LOW (ref 3.5–5.0)
Alkaline Phosphatase: 45 U/L (ref 38–126)
Anion gap: 7 (ref 5–15)
BUN: 13 mg/dL (ref 6–20)
CO2: 28 mmol/L (ref 22–32)
Calcium: 9 mg/dL (ref 8.9–10.3)
Chloride: 108 mmol/L (ref 101–111)
Creatinine, Ser: 0.74 mg/dL (ref 0.44–1.00)
GFR calc Af Amer: >60 mL/min (ref >60)
GFR calc non Af Amer: >60 mL/min (ref >60)
Glucose, Bld: 102 mg/dL — ABNORMAL HIGH (ref 65–99)
Potassium: 3.7 mmol/L (ref 3.5–5.1)
Sodium: 143 mmol/L (ref 135–145)
Total Bilirubin: 0.7 mg/dL (ref 0.3–1.2)
Total Protein: 5.8 g/dL — ABNORMAL LOW (ref 6.5–8.1)

## 2016-02-28 LAB — CBC
HCT: 35.2 % — ABNORMAL LOW (ref 36.0–46.0)
Hemoglobin: 12 g/dL (ref 12.0–15.0)
MCH: 31.3 pg (ref 26.0–34.0)
MCHC: 34.1 g/dL (ref 30.0–36.0)
MCV: 91.9 fL (ref 78.0–100.0)
Platelets: 199 10*3/uL (ref 150–400)
RBC: 3.83 MIL/uL — ABNORMAL LOW (ref 3.87–5.11)
RDW: 13.4 % (ref 11.5–15.5)
WBC: 12.5 10*3/uL — ABNORMAL HIGH (ref 4.0–10.5)

## 2016-02-28 LAB — MAGNESIUM: Magnesium: 1.7 mg/dL (ref 1.7–2.4)

## 2016-02-28 MED ORDER — AMLODIPINE BESYLATE 2.5 MG PO TABS: 2.5000 mg | ORAL_TABLET | Freq: Every day | ORAL | Status: DC

## 2016-02-28 NOTE — Progress Notes (Signed)
Patient was given handout and was provided discharge teaching. Patient eagerly accepted and had no questions and verbalized understanding of instructions.  Pain was controlled upon discharge and family was at bedside to receive instructions as well.

## 2016-02-28 NOTE — Discharge Instructions (Signed)
Laparoscopic Cholecystectomy, Care After °Refer to this sheet in the next few weeks. These instructions provide you with information about caring for yourself after your procedure. Your health care provider may also give you more specific instructions. Your treatment has been planned according to current medical practices, but problems sometimes occur. Call your health care provider if you have any problems or questions after your procedure. °WHAT TO EXPECT AFTER THE PROCEDURE °After your procedure, it is common to have: °· Pain at your incision sites. You will be given pain medicines to control your pain. °· Mild nausea or vomiting. This should improve after the first 24 hours. °· Bloating and possible shoulder pain from the gas that was used during the procedure. This will improve after the first 24 hours. °HOME CARE INSTRUCTIONS °Incision Care °· Follow instructions from your health care provider about how to take care of your incisions. Make sure you: °¨ Wash your hands with soap and water before you change your bandage (dressing). If soap and water are not available, use hand sanitizer. °¨ Change your dressing as told by your health care provider. °¨ Leave stitches (sutures), skin glue, or adhesive strips in place. These skin closures may need to be in place for 2 weeks or longer. If adhesive strip edges start to loosen and curl up, you may trim the loose edges. Do not remove adhesive strips completely unless your health care provider tells you to do that. °· Do not take baths, swim, or use a hot tub until your health care provider approves. Ask your health care provider if you can take showers. You may only be allowed to take sponge baths for bathing. °General Instructions °· Take over-the-counter and prescription medicines only as told by your health care provider. °· Do not drive or operate heavy machinery while taking prescription pain medicine. °· Return to your normal diet as told by your health care  provider. °· Do not lift anything that is heavier than 10 lb (4.5 kg). °· Do not play contact sports for one week or until your health care provider approves. °SEEK MEDICAL CARE IF:  °· You have redness, swelling, or pain at the site of your incision. °· You have fluid, blood, or pus coming from your incision. °· You notice a bad smell coming from your incision area. °· Your surgical incisions break open. °· You have a fever. °SEEK IMMEDIATE MEDICAL CARE IF: °· You develop a rash. °· You have difficulty breathing. °· You have chest pain. °· You have increasing pain in your shoulders (shoulder strap areas). °· You faint or have dizzy episodes while you are standing. °· You have severe pain in your abdomen. °· You have nausea or vomiting that lasts for more than one day. °  °This information is not intended to replace advice given to you by your health care provider. Make sure you discuss any questions you have with your health care provider. °  °Document Released: 12/03/2005 Document Revised: 08/24/2015 Document Reviewed: 07/15/2013 °Elsevier Interactive Patient Education ©2016 [employer] °CCS ______[facility] °LAPAROSCOPIC SURGERY: POST OP INSTRUCTIONS °Always review your discharge instruction sheet given to you by the facility where your surgery was performed. °IF YOU HAVE DISABILITY OR FAMILY LEAVE FORMS, YOU MUST BRING THEM TO THE OFFICE FOR PROCESSING.   °DO NOT GIVE THEM TO YOUR DOCTOR. ° °1. A prescription for pain medication may be given to you upon discharge.  Take your pain medication as prescribed, if needed.  If narcotic   pain medicine is not needed, then you may take acetaminophen (Tylenol) or ibuprofen (Advil) as needed. °2. Take your usually prescribed medications unless otherwise directed. °3. If you need a refill on your pain medication, please contact your pharmacy.  They will contact our office to request authorization. Prescriptions will not be filled after 5pm or on  week-ends. °4. You should follow a light diet the first few days after arrival home, such as soup and crackers, etc.  Be sure to include lots of fluids daily. °5. Most patients will experience some swelling and bruising in the area of the incisions.  Ice packs will help.  Swelling and bruising can take several days to resolve.  °6. It is common to experience some constipation if taking pain medication after surgery.  Increasing fluid intake and taking a stool softener (such as Colace) will usually help or prevent this problem from occurring.  A mild laxative (Milk of Magnesia or Miralax) should be taken according to package instructions if there are no bowel movements after 48 hours. °7. Unless discharge instructions indicate otherwise, you may remove your bandages 24-48 hours after surgery, and you may shower at that time.  You may have steri-strips (small skin tapes) in place directly over the incision.  These strips should be left on the skin for 7-10 days.  If your surgeon used skin glue on the incision, you may shower in 24 hours.  The glue will flake off over the next 2-3 weeks.  Any sutures or staples will be removed at the office during your follow-up visit. °8. ACTIVITIES:  You may resume regular (light) daily activities beginning the next day--such as daily self-care, walking, climbing stairs--gradually increasing activities as tolerated.  You may have sexual intercourse when it is comfortable.  Refrain from any heavy lifting or straining until approved by your doctor. °a. You may drive when you are no longer taking prescription pain medication, you can comfortably wear a seatbelt, and you can safely maneuver your car and apply brakes. °b. RETURN TO WORK:  __________________________________________________________ °9. You should see your doctor in the office for a follow-up appointment approximately 2-3 weeks after your surgery.  Make sure that you call for this appointment within a day or two after you  arrive home to insure a convenient appointment time. °10. OTHER INSTRUCTIONS: __________________________________________________________________________________________________________________________ __________________________________________________________________________________________________________________________ °WHEN TO CALL YOUR DOCTOR: °1. Fever over 101.0 °2. Inability to urinate °3. Continued bleeding from incision. °4. Increased pain, redness, or drainage from the incision. °5. Increasing abdominal pain ° °The clinic staff is available to answer your questions during regular business hours.  Please don’t hesitate to call and ask to speak to one of the nurses for clinical concerns.  If you have a medical emergency, go to the nearest emergency room or call 911.  A surgeon from [facility] is always on call at the hospital. °[address], [address], [place], [place]  [address] ? [address] [address], [place], [place]   [address] °[phone] ? [phone] ? FAX [phone] °Web site: [email or web address] ° °

## 2016-02-28 NOTE — Discharge Summary (Signed)
Physician Discharge Summary  Carol Duncan  GEX:[id number]  DOB: 1951-06-23  DOA: 02/26/2016  PCP: Pearla Dubonnet, MD  Outpatient Specialists: Psychiatry: Dr. Annia Friendly  Admit date: 02/26/2016 Discharge date: 02/28/2016  Time spent: Greater than 30 minutes  Recommendations for Outpatient Follow-up:  1. Dr. Romie Levee, General Surgery: MDs office will arrange follow-up appointment to be seen with repeat labs (CMP). 2. Dr. Marden Noble, PCP in 1 week. 3. Recommend outpatient follow-up and evaluation of pulmonary nodule that was seen on chest x-ray. This was discussed with patient and she verbalizes understanding. 4. Patient follows with outpatient psychiatry.  Discharge Diagnoses:  Principal Problem:   Epigastric abdominal pain Active Problems:   Cholelithiasis   Hypertension   Biliary colic   Discharge Condition: Improved & Stable  Diet recommendation: Heart healthy diet.  Filed Weights   02/27/16 0626  Weight: 58.06 kg (128 lb)    History of present illness:  65 year old female patient with history of bipolar 2 disorder on lithium and follows with outpatient psychiatry, HTN, hypothyroid, presented to ED on 02/26/16 with complaints of epigastric abdominal pain and nonbloody emesis. In the ED, she was afebrile, no leukocytosis, mild lipase elevation and ultrasound abdomen showed gallstones without features of acute cholecystitis. She's had 2 similar hospitalizations in the past for abdominal pain. General surgery consulted for evaluation.  Hospital Course:   Abdominal pain/cholelithiasis, s/p laparoscopic cholecystectomy 02/27/16 - Abdominal pain was likely from symptomatic gallstones. - Patient denies history of tobacco or alcohol use, NSAID use, gastritis/GERD/PUD. No history of eating out or sick contacts with similar complaints. - Minimally elevated lipase of no clear significance and has normalized. - Gen. surgery was consulted. Patient underwent  laparoscopic cholecystectomy on 02/27/16. Patient's abdominal pain has resolved since last night and currently only has soreness at site of intervention. Diet was advanced and patient has tolerated regular consistency diet for lunch. No nausea or vomiting. Surgery have seen and cleared her for discharge home and they will arrange outpatient follow-up with repeat labs (CMP).  Essential hypertension - On no medications at home. Now may be worsened related to pain. - Patient was started on amlodipine 2.5 MG daily. Improved control. Close outpatient follow-up with PCP.  Hypothyroid - Continue Armour Thyroid.  Hypokalemia/hypomagnesemia - Replaced.  Bipolar 2 disorder - Lithium levels low. Continue lithium and outpatient follow-up with psychiatry. Stable.  Fibromyalgia - On gabapentin and Lamictal as outpatient.  4 mm left lung nodule on CXR - Recommend OP follow up with PCP for further evaluation i.e CT chest. Discussed with patient and she verbalizes understanding.     Consultants:  Surgery  Procedures:  Laparoscopic cholecystectomy 02/27/16  Antimicrobials:  None   Discharge Exam:  Complaints:  Abdominal pain resolved since last night. Just mild soreness at site of laparoscopy. No nausea or vomiting. Has tolerated lunch and ambulated in the halls. No dyspnea or chest pain.  Filed Vitals:   02/27/16 2109 02/28/16 0505 02/28/16 0926 02/28/16 0952  BP: 139/71 133/66 149/84 149/65  Pulse: 71 66  61  Temp: 98.6 F (37 C) 99.2 F (37.3 C)  98.8 F (37.1 C)  TempSrc: Axillary Oral  Oral  Resp: Height:      Weight:      SpO2: 100% 98%  100%    General exam: Pleasant middle aged female seen ambulating comfortably in the room this morning.  Respiratory system: Clear. No increased work of breathing. Cardiovascular system: S1 & S2 heard, RRR. No  JVD, murmurs, gallops, clicks or pedal edema. Gastrointestinal system: Abdomen is nondistended, soft & nontender.  Normal bowel sounds heard. Central nervous system: Alert and oriented. No focal neurological deficits. Extremities: Symmetric 5 x 5 power. Psychiatry: pleasant and appropriate.  Discharge Instructions      Discharge Instructions    Call MD for:  difficulty breathing, headache or visual disturbances    Complete by:  As directed      Call MD for:  extreme fatigue    Complete by:  As directed      Call MD for:  persistant dizziness or light-headedness    Complete by:  As directed      Call MD for:  persistant nausea and vomiting    Complete by:  As directed      Call MD for:  redness, tenderness, or signs of infection (pain, swelling, redness, odor or green/yellow discharge around incision site)    Complete by:  As directed      Call MD for:  severe uncontrolled pain    Complete by:  As directed      Call MD for:  temperature >100.4    Complete by:  As directed      Diet - low sodium heart healthy    Complete by:  As directed      Increase activity slowly    Complete by:  As directed             Medication List    STOP taking these medications        hyoscyamine 0.125 MG SL tablet  Commonly known as:  LEVSIN/SL      TAKE these medications        ALPRAZolam 1 MG tablet  Commonly known as:  XANAX  Take 0.5 mg by mouth 3 (three) times daily as needed for anxiety.     amLODipine 2.5 MG tablet  Commonly known as:  NORVASC  Take 1 tablet (2.5 mg total) by mouth daily.     Biotin 5000 MCG Caps  Take 10,000 mcg by mouth daily.     gabapentin 300 MG capsule  Commonly known as:  NEURONTIN  Take 300-600 mg by mouth at bedtime. Take 300mg s daily at bedtime for 7 days then take 600mg s daily     HYDROcodone-acetaminophen 10-325 MG tablet  Commonly known as:  NORCO  Take 1-2 tablets by mouth every 6 (six) hours as needed for severe pain (takes one-half tablet).     lamoTRIgine 200 MG tablet  Commonly known as:  LAMICTAL  Take 200 mg by mouth every morning.     LINZESS  145 MCG Caps capsule  Generic drug:  Linaclotide  Take 145 mg by mouth daily.     lithium carbonate 150 MG capsule  Take 150 mg by mouth daily.     ondansetron 8 MG tablet  Commonly known as:  ZOFRAN  Take 8 mg by mouth every 8 (eight) hours as needed for nausea or vomiting.     pramipexole 0.5 MG tablet  Commonly known as:  MIRAPEX  Take 1.5 mg by mouth at bedtime as needed (for sleep).     thyroid 30 MG tablet  Commonly known as:  ARMOUR  Take 30 mg by mouth 2 (two) times daily.     traMADol 50 MG tablet  Commonly known as:  ULTRAM  Take 100 mg by mouth 2 (two) times daily as needed for moderate pain.     valACYclovir 500 MG tablet  Commonly known as:  VALTREX  Take 500 mg by mouth 2 (two) times daily. As needed for outbreaks       Follow-up Information    Follow up with Vanita Panda., MD.   Specialty:  General Surgery   Why:  If they do not call you by tomorrow 02/29/16 with the appointment call them in the afternoon.  You need a CMP blood study performed the day before you see DR. [employer] information:   [address] [place] [address] [phone]       Follow up with GATES,ROBERT NEVILL, MD. Schedule an appointment as soon as possible for a visit in 1 week.   Specialty:  Internal Medicine   Contact information:   [address] [place] [address] [phone]       Get Medicines reviewed and adjusted: Please take all your medications with you for your next visit with your Primary MD  Please request your Primary MD to go over all hospital tests and procedure/radiological results at the follow up. Please ask your Primary MD to get all Hospital records sent to his/her office.  If you experience worsening of your admission symptoms, develop shortness of breath, life threatening emergency, suicidal or homicidal thoughts you must seek medical attention immediately by calling 911 or calling your MD immediately if symptoms  less severe.  You must read complete instructions/literature along with all the possible adverse reactions/side effects for all the Medicines you take and that have been prescribed to you. Take any new Medicines after you have completely understood and accept all the possible adverse reactions/side effects.   Do not drive when taking pain medications.   Do not take more than prescribed Pain, Sleep and Anxiety Medications  Special Instructions: If you have smoked or chewed Tobacco in the last 2 yrs please stop smoking, stop any regular Alcohol and or any Recreational drug use.  Wear Seat belts while driving.  Please note  You were cared for by a hospitalist during your hospital stay. Once you are discharged, your primary care physician will handle any further medical issues. Please note that NO REFILLS for any discharge medications will be authorized once you are discharged, as it is imperative that you return to your primary care physician (or establish a relationship with a primary care physician if you do not have one) for your aftercare needs so that they can reassess your need for medications and monitor your lab values.    The results of significant diagnostics from this hospitalization (including imaging, microbiology, ancillary and laboratory) are listed below for reference.    Significant Diagnostic Studies: Dg Chest 2 View  02/26/2016  CLINICAL DATA:  Epigastric chest pain EXAM: CHEST  2 VIEW COMPARISON:  None. FINDINGS: Hyperexpansion is consistent with emphysema. The lungs are clear wiithout focal pneumonia, edema, pneumothorax or pleural effusion. 4-5 mm pulmonary nodule identified in the left mid lung. The cardio pericardial silhouette is enlarged. Old right clavicle fracture noted. Telemetry leads overlie the chest. IMPRESSION: 1. Cardiomegaly with emphysema. 2. 4 mm left lung nodule. CT chest without contrast recommended to further evaluate. Electronically Signed   By: Kennith Center M.D.   On: 02/26/2016 21:15   Dg Cholangiogram Operative  02/27/2016  CLINICAL DATA:  Cholecystectomy for cholelithiasis and gallstone pancreatitis. EXAM: INTRAOPERATIVE CHOLANGIOGRAM TECHNIQUE: Cholangiographic images from the C-arm fluoroscopic device were submitted for interpretation post-operatively. Please see the procedural report for the amount of contrast  and the fluoroscopy time utilized. COMPARISON:  Ultrasound on 02/26/2016 FINDINGS: Intraoperative cholangiogram demonstrates no evidence of biliary obstruction with contrast entering the duodenum normally. There are some small filling defects noted in opacified bile ducts. Some small filling defects in the proximal right intrahepatic duct show rapid movement during contrast injection and may relate to air bubbles. There is an additional suggestion of a relatively fixed filling defect in the upper common bile duct just below the cystic duct insertion. This does not appear to be entirely flow related and a nonobstructing calculus cannot be excluded. IMPRESSION: No evidence of biliary obstruction. Some small intrahepatic biliary filling defects in the proximal right intrahepatic duct may relate to air bubbles given their fairly rapid movement with contrast injection. Suggestion of a more fixed filling defect in the upper common bile duct may represent a calculus. Electronically Signed   By: Irish Lack M.D.   On: 02/27/2016 14:37   US Abdomen Limited Ruq  02/26/2016  CLINICAL DATA:  Upper abdominal pain, history of gallstones. EXAM: US ABDOMEN LIMITED - RIGHT UPPER QUADRANT COMPARISON:  CT abdomen and pelvis September 24, 2015 FINDINGS: Gallbladder: 7 mm echogenic gallstone, with acoustic shadowing. No gallbladder wall thickening or pericholecystic fluid. No sonographic Murphy's sign elicited. Common bile duct: Diameter: 3-4 mm Liver: No focal lesion identified. Within normal limits in parenchymal echogenicity. Hepatopetal portal vein.  IMPRESSION: Cholelithiasis without sonographic findings of acute cholecystitis. Electronically Signed   By: Awilda Metro M.D.   On: 02/26/2016 23:14    Microbiology: Recent Results (from the past 240 hour(s))  Surgical pcr screen     Status: Abnormal   Collection Time: 02/27/16 12:14 PM  Result Value Ref Range Status   MRSA, PCR NEGATIVE NEGATIVE Final   Staphylococcus aureus POSITIVE (A) NEGATIVE Final    Comment:        The Xpert SA Assay (FDA approved for NASAL specimens in patients over 15 years of age), is one component of a comprehensive surveillance program.  Test performance has been validated by [facility] for patients greater than or equal to 95 year old. It is not intended to diagnose infection nor to guide or monitor treatment.      Labs: Basic Metabolic Panel:  Recent Labs Lab 02/26/16 2102 02/27/16 0359 02/27/16 0515 02/28/16 0420  NA 139 138  --  143  K 3.9 3.0*  --  3.7  CL 100* 99*  --  108  CO2 28 26  --  28  GLUCOSE 142* 155*  --  102*  BUN 12 9  --  13  CREATININE 0.73 0.57  --  0.74  CALCIUM 9.7 9.0  --  9.0  MG  --   --  1.5* 1.7   Liver Function Tests:  Recent Labs Lab 02/26/16 2102 02/27/16 0359 02/28/16 0420  AST ALT ALKPHOS 60 60 45  BILITOT 0.6 0.7 0.7  PROT 7.6 7.1 5.8*  ALBUMIN 4.2 4.1 3.2*    Recent Labs Lab 02/26/16 2102 02/27/16 0359  LIPASE 78* 29   No results for input(s): AMMONIA in the last 168 hours. CBC:  Recent Labs Lab 02/26/16 2102 02/27/16 0359 02/28/16 0420  WBC 9.1 8.7 12.5*  HGB 14.2 14.6 12.0  HCT 42.4 42.6 35.2*  MCV 94.0 94.0 91.9  PLT 215 238 199   Cardiac Enzymes: No results for input(s): CKTOTAL, CKMB, CKMBINDEX, TROPONINI in the last 168 hours. BNP: BNP (last 3 results) No results  for input(s): BNP in the last 8760 hours.  ProBNP (last 3 results) No results for input(s): PROBNP in the last 8760 hours.  CBG: No results for input(s): GLUCAP in the last  168 hours.     Signed:  Marcellus ScottHONGALGI,Haivyn Oravec, MD, FACP, FHM. [facility] Pager [phone]  If 7PM-7AM, please contact night-coverage [email or web address] Password [email or web address] 02/28/2016, 1:56 PM

## 2016-02-28 NOTE — Progress Notes (Signed)
1 Day Post-Op  Subjective: She looks great, she wants to eat and go home.  Sites are sore, but overall look good.  She is walking all around the unit.  Objective: Vital signs in last 24 hours: Temp:  [98.6 F (37 C)-99.6 F (37.6 C)] 99.2 F (37.3 C) (03/14 0505) Pulse Rate:  [63-82] 66 (03/14 0505) Resp:  [12-20] 16 (03/14 0505) BP: (133-189)/(61-92) 133/66 mmHg (03/14 0505) SpO2:  [98 %-100 %] 98 % (03/14 0505)   360 PO  Clears 4200 urine Afebrile, VSS Labs OK WBC is up some    IOC:  No evidence of biliary obstruction. Some small intrahepatic biliary filling defects in the proximal right intrahepatic duct may relate to air bubbles given their fairly rapid movement with contrast injection. Suggestion of a more fixed filling defect in the upper common bile duct may represent a calculus.   Intake/Output from previous day: 03/13 0701 - 03/14 0700 In: 2010 [P.O.:360; I.V.:1650] Out: 4220 [Urine:4200; Blood:20] Intake/Output this shift:    General appearance: alert, cooperative and no distress GI: soft sore, sites look fine.  Lab Results:   Recent Labs  02/27/16 0359 02/28/16 0420  WBC 8.7 12.5*  HGB 14.6 12.0  HCT 42.6 35.2*  PLT 238 199    BMET  Recent Labs  02/27/16 0359 02/28/16 0420  NA 138 143  K 3.0* 3.7  CL 99* 108  CO2 26 28  GLUCOSE 155* 102*  BUN 9 13  CREATININE 0.57 0.74  CALCIUM 9.0 9.0   PT/INR No results for input(s): LABPROT, INR in the last 72 hours.   Recent Labs Lab 02/26/16 2102 02/27/16 0359 02/28/16 0420  AST ALT ALKPHOS 60 60 45  BILITOT 0.6 0.7 0.7  PROT 7.6 7.1 5.8*  ALBUMIN 4.2 4.1 3.2*     Lipase     Component Value Date/Time   LIPASE 29 02/27/2016 0359     Studies/Results: Dg Chest 2 View  02/26/2016  CLINICAL DATA:  Epigastric chest pain EXAM: CHEST  2 VIEW COMPARISON:  None. FINDINGS: Hyperexpansion is consistent with emphysema. The lungs are clear wiithout focal pneumonia, edema,  pneumothorax or pleural effusion. 4-5 mm pulmonary nodule identified in the left mid lung. The cardio pericardial silhouette is enlarged. Old right clavicle fracture noted. Telemetry leads overlie the chest. IMPRESSION: 1. Cardiomegaly with emphysema. 2. 4 mm left lung nodule. CT chest without contrast recommended to further evaluate. Electronically Signed   By: Kennith Center M.D.   On: 02/26/2016 21:15   Dg Cholangiogram Operative  02/27/2016  CLINICAL DATA:  Cholecystectomy for cholelithiasis and gallstone pancreatitis. EXAM: INTRAOPERATIVE CHOLANGIOGRAM TECHNIQUE: Cholangiographic images from the C-arm fluoroscopic device were submitted for interpretation post-operatively. Please see the procedural report for the amount of contrast and the fluoroscopy time utilized. COMPARISON:  Ultrasound on 02/26/2016 FINDINGS: Intraoperative cholangiogram demonstrates no evidence of biliary obstruction with contrast entering the duodenum normally. There are some small filling defects noted in opacified bile ducts. Some small filling defects in the proximal right intrahepatic duct show rapid movement during contrast injection and may relate to air bubbles. There is an additional suggestion of a relatively fixed filling defect in the upper common bile duct just below the cystic duct insertion. This does not appear to be entirely flow related and a nonobstructing calculus cannot be excluded. IMPRESSION: No evidence of biliary obstruction. Some small intrahepatic biliary filling defects in the proximal right intrahepatic duct may relate to air bubbles  given their fairly rapid movement with contrast injection. Suggestion of a more fixed filling defect in the upper common bile duct may represent a calculus. Electronically Signed   By: Irish LackGlenn  Yamagata M.D.   On: 02/27/2016 14:37   [place] Abdomen Limited Ruq  02/26/2016  CLINICAL DATA:  Upper abdominal pain, history of gallstones. EXAM: US ABDOMEN LIMITED - RIGHT UPPER QUADRANT  COMPARISON:  CT abdomen and pelvis September 24, 2015 FINDINGS: Gallbladder: 7 mm echogenic gallstone, with acoustic shadowing. No gallbladder wall thickening or pericholecystic fluid. No sonographic Murphy's sign elicited. Common bile duct: Diameter: 3-4 mm Liver: No focal lesion identified. Within normal limits in parenchymal echogenicity. Hepatopetal portal vein. IMPRESSION: Cholelithiasis without sonographic findings of acute cholecystitis. Electronically Signed   By: Awilda Metroourtnay  Bloomer M.D.   On: 02/26/2016 23:14    Medications: . amLODipine  2.5 mg Oral Daily  . enoxaparin (LOVENOX) injection  40 mg Subcutaneous Q24H  . gabapentin  300 mg Oral QHS   Followed by  . [START ON 03/01/2016] gabapentin  600 mg Oral QHS  . lamoTRIgine  200 mg Oral Daily  . Linaclotide  145 mcg Oral Daily  . lithium carbonate  150 mg Oral Daily  . magnesium sulfate 1 - 4 g bolus IVPB  2 g Intravenous Once  . thyroid  30 mg Oral BID  . valACYclovir  500 mg Oral BID    Assessment/Plan Epigastric pain, with cholelithiasis/pancreatitis S/p LAPAROSCOPIC CHOLECYSTECTOMY WITH INTRAOPERATIVE CHOLANGIOGRAM, 02/27/16, DR. Dery Normal LFT, mildly elevated lipase last PM, I will recheck this AM Bipolar Disorder Fibromyalgia Hypothyroid Chronic constipation    Plan:  Eat regular diet, and try PO pain meds.  Follow up in our office with Dr. Maisie Fushomas in 2 weeks with LFT's.   If she does well she can go home after breakfast or lunch.      LOS: 1 day    Weaver Tweed 02/28/2016

## 2016-03-09 ENCOUNTER — Other Ambulatory Visit: Payer: Self-pay | Admitting: Internal Medicine

## 2016-03-09 DIAGNOSIS — R911 Solitary pulmonary nodule: Secondary | ICD-10-CM

## 2016-03-16 ENCOUNTER — Ambulatory Visit
Admission: RE | Admit: 2016-03-16 | Discharge: 2016-03-16 | Disposition: A | Discharge status: Home / Self Care | Source: Ambulatory Visit | Attending: Internal Medicine | Admitting: Internal Medicine

## 2016-03-16 DIAGNOSIS — R911 Solitary pulmonary nodule: Secondary | ICD-10-CM

## 2016-09-06 DIAGNOSIS — F3170 Bipolar disorder, currently in remission, most recent episode unspecified: Secondary | ICD-10-CM | POA: Diagnosis not present

## 2016-09-06 DIAGNOSIS — F411 Generalized anxiety disorder: Secondary | ICD-10-CM | POA: Diagnosis not present

## 2016-09-14 DIAGNOSIS — R911 Solitary pulmonary nodule: Secondary | ICD-10-CM | POA: Diagnosis not present

## 2016-09-14 DIAGNOSIS — K581 Irritable bowel syndrome with constipation: Secondary | ICD-10-CM | POA: Diagnosis not present

## 2016-09-14 DIAGNOSIS — E871 Hypo-osmolality and hyponatremia: Secondary | ICD-10-CM | POA: Diagnosis not present

## 2016-09-14 DIAGNOSIS — M47812 Spondylosis without myelopathy or radiculopathy, cervical region: Secondary | ICD-10-CM | POA: Diagnosis not present

## 2016-09-14 DIAGNOSIS — E039 Hypothyroidism, unspecified: Secondary | ICD-10-CM | POA: Diagnosis not present

## 2016-09-14 DIAGNOSIS — R946 Abnormal results of thyroid function studies: Secondary | ICD-10-CM | POA: Diagnosis not present

## 2016-09-14 DIAGNOSIS — I10 Essential (primary) hypertension: Secondary | ICD-10-CM | POA: Diagnosis not present

## 2016-09-14 DIAGNOSIS — R109 Unspecified abdominal pain: Secondary | ICD-10-CM | POA: Diagnosis not present

## 2016-09-14 DIAGNOSIS — R1013 Epigastric pain: Secondary | ICD-10-CM | POA: Diagnosis not present

## 2016-09-15 ENCOUNTER — Other Ambulatory Visit: Payer: Self-pay | Admitting: Internal Medicine

## 2016-09-15 DIAGNOSIS — M5412 Radiculopathy, cervical region: Secondary | ICD-10-CM

## 2016-09-17 ENCOUNTER — Ambulatory Visit
Admission: RE | Admit: 2016-09-17 | Discharge: 2016-09-17 | Disposition: A | Discharge status: Home / Self Care | Payer: Medicare Other | Source: Ambulatory Visit | Attending: Internal Medicine | Admitting: Internal Medicine

## 2016-09-17 DIAGNOSIS — M4802 Spinal stenosis, cervical region: Secondary | ICD-10-CM | POA: Diagnosis not present

## 2016-09-17 DIAGNOSIS — M5412 Radiculopathy, cervical region: Secondary | ICD-10-CM

## 2016-10-11 DIAGNOSIS — M549 Dorsalgia, unspecified: Secondary | ICD-10-CM | POA: Diagnosis not present

## 2016-10-11 DIAGNOSIS — M542 Cervicalgia: Secondary | ICD-10-CM | POA: Diagnosis not present

## 2016-10-11 DIAGNOSIS — M4316 Spondylolisthesis, lumbar region: Secondary | ICD-10-CM | POA: Diagnosis not present

## 2016-10-11 DIAGNOSIS — M47816 Spondylosis without myelopathy or radiculopathy, lumbar region: Secondary | ICD-10-CM | POA: Diagnosis not present

## 2016-10-11 DIAGNOSIS — M47892 Other spondylosis, cervical region: Secondary | ICD-10-CM | POA: Diagnosis not present

## 2016-10-11 DIAGNOSIS — M4802 Spinal stenosis, cervical region: Secondary | ICD-10-CM | POA: Diagnosis not present

## 2016-10-19 ENCOUNTER — Other Ambulatory Visit: Payer: Self-pay | Admitting: Rehabilitation

## 2016-10-19 DIAGNOSIS — M4316 Spondylolisthesis, lumbar region: Secondary | ICD-10-CM

## 2016-10-20 ENCOUNTER — Ambulatory Visit
Admission: RE | Admit: 2016-10-20 | Discharge: 2016-10-20 | Disposition: A | Discharge status: Home / Self Care | Payer: Medicare Other | Source: Ambulatory Visit | Attending: Rehabilitation | Admitting: Rehabilitation

## 2016-10-20 DIAGNOSIS — M4316 Spondylolisthesis, lumbar region: Secondary | ICD-10-CM

## 2016-10-20 DIAGNOSIS — M48061 Spinal stenosis, lumbar region without neurogenic claudication: Secondary | ICD-10-CM | POA: Diagnosis not present

## 2016-10-25 DIAGNOSIS — M4316 Spondylolisthesis, lumbar region: Secondary | ICD-10-CM | POA: Diagnosis not present

## 2016-10-25 DIAGNOSIS — M4802 Spinal stenosis, cervical region: Secondary | ICD-10-CM | POA: Diagnosis not present

## 2016-10-25 DIAGNOSIS — M4726 Other spondylosis with radiculopathy, lumbar region: Secondary | ICD-10-CM | POA: Diagnosis not present

## 2016-10-25 DIAGNOSIS — M47892 Other spondylosis, cervical region: Secondary | ICD-10-CM | POA: Diagnosis not present

## 2016-11-05 DIAGNOSIS — M48061 Spinal stenosis, lumbar region without neurogenic claudication: Secondary | ICD-10-CM | POA: Diagnosis not present

## 2016-11-13 DIAGNOSIS — R946 Abnormal results of thyroid function studies: Secondary | ICD-10-CM | POA: Diagnosis not present

## 2016-11-13 DIAGNOSIS — M47812 Spondylosis without myelopathy or radiculopathy, cervical region: Secondary | ICD-10-CM | POA: Diagnosis not present

## 2016-11-13 DIAGNOSIS — W57XXXA Bitten or stung by nonvenomous insect and other nonvenomous arthropods, initial encounter: Secondary | ICD-10-CM | POA: Diagnosis not present

## 2016-11-13 DIAGNOSIS — R911 Solitary pulmonary nodule: Secondary | ICD-10-CM | POA: Diagnosis not present

## 2016-11-13 DIAGNOSIS — Z0001 Encounter for general adult medical examination with abnormal findings: Secondary | ICD-10-CM | POA: Diagnosis not present

## 2016-11-13 DIAGNOSIS — E871 Hypo-osmolality and hyponatremia: Secondary | ICD-10-CM | POA: Diagnosis not present

## 2016-11-13 DIAGNOSIS — F429 Obsessive-compulsive disorder, unspecified: Secondary | ICD-10-CM | POA: Diagnosis not present

## 2016-11-13 DIAGNOSIS — Z79899 Other long term (current) drug therapy: Secondary | ICD-10-CM | POA: Diagnosis not present

## 2016-11-13 DIAGNOSIS — E039 Hypothyroidism, unspecified: Secondary | ICD-10-CM | POA: Diagnosis not present

## 2016-11-13 DIAGNOSIS — I10 Essential (primary) hypertension: Secondary | ICD-10-CM | POA: Diagnosis not present

## 2016-11-13 DIAGNOSIS — R1013 Epigastric pain: Secondary | ICD-10-CM | POA: Diagnosis not present

## 2016-11-13 DIAGNOSIS — M797 Fibromyalgia: Secondary | ICD-10-CM | POA: Diagnosis not present

## 2016-11-13 DIAGNOSIS — K581 Irritable bowel syndrome with constipation: Secondary | ICD-10-CM | POA: Diagnosis not present

## 2016-11-19 DIAGNOSIS — M48061 Spinal stenosis, lumbar region without neurogenic claudication: Secondary | ICD-10-CM | POA: Diagnosis not present

## 2016-12-12 DIAGNOSIS — M4316 Spondylolisthesis, lumbar region: Secondary | ICD-10-CM | POA: Diagnosis not present

## 2016-12-12 DIAGNOSIS — Z6826 Body mass index (BMI) 26.0-26.9, adult: Secondary | ICD-10-CM | POA: Diagnosis not present

## 2016-12-12 DIAGNOSIS — M47892 Other spondylosis, cervical region: Secondary | ICD-10-CM | POA: Diagnosis not present

## 2016-12-12 DIAGNOSIS — M545 Low back pain: Secondary | ICD-10-CM | POA: Diagnosis not present

## 2016-12-12 DIAGNOSIS — M48061 Spinal stenosis, lumbar region without neurogenic claudication: Secondary | ICD-10-CM | POA: Diagnosis not present

## 2016-12-24 DIAGNOSIS — M5033 Other cervical disc degeneration, cervicothoracic region: Secondary | ICD-10-CM | POA: Diagnosis not present

## 2016-12-27 DIAGNOSIS — F411 Generalized anxiety disorder: Secondary | ICD-10-CM | POA: Diagnosis not present

## 2016-12-27 DIAGNOSIS — F3170 Bipolar disorder, currently in remission, most recent episode unspecified: Secondary | ICD-10-CM | POA: Diagnosis not present

## 2017-01-16 DIAGNOSIS — M79644 Pain in right finger(s): Secondary | ICD-10-CM | POA: Diagnosis not present

## 2017-01-16 DIAGNOSIS — S62609A Fracture of unspecified phalanx of unspecified finger, initial encounter for closed fracture: Secondary | ICD-10-CM | POA: Diagnosis not present

## 2017-01-21 DIAGNOSIS — Z79891 Long term (current) use of opiate analgesic: Secondary | ICD-10-CM | POA: Diagnosis not present

## 2017-01-21 DIAGNOSIS — M542 Cervicalgia: Secondary | ICD-10-CM | POA: Diagnosis not present

## 2017-01-21 DIAGNOSIS — Z79899 Other long term (current) drug therapy: Secondary | ICD-10-CM | POA: Diagnosis not present

## 2017-01-21 DIAGNOSIS — M47892 Other spondylosis, cervical region: Secondary | ICD-10-CM | POA: Diagnosis not present

## 2017-01-21 DIAGNOSIS — G894 Chronic pain syndrome: Secondary | ICD-10-CM | POA: Diagnosis not present

## 2017-02-04 DIAGNOSIS — M791 Myalgia: Secondary | ICD-10-CM | POA: Diagnosis not present

## 2017-02-04 DIAGNOSIS — M47812 Spondylosis without myelopathy or radiculopathy, cervical region: Secondary | ICD-10-CM | POA: Diagnosis not present

## 2017-02-04 DIAGNOSIS — Z6826 Body mass index (BMI) 26.0-26.9, adult: Secondary | ICD-10-CM | POA: Diagnosis not present

## 2017-02-04 DIAGNOSIS — M542 Cervicalgia: Secondary | ICD-10-CM | POA: Diagnosis not present

## 2017-02-05 DIAGNOSIS — F411 Generalized anxiety disorder: Secondary | ICD-10-CM | POA: Diagnosis not present

## 2017-02-05 DIAGNOSIS — F3170 Bipolar disorder, currently in remission, most recent episode unspecified: Secondary | ICD-10-CM | POA: Diagnosis not present

## 2017-02-08 DIAGNOSIS — M6281 Muscle weakness (generalized): Secondary | ICD-10-CM | POA: Diagnosis not present

## 2017-02-08 DIAGNOSIS — R293 Abnormal posture: Secondary | ICD-10-CM | POA: Diagnosis not present

## 2017-02-08 DIAGNOSIS — M542 Cervicalgia: Secondary | ICD-10-CM | POA: Diagnosis not present

## 2017-02-11 DIAGNOSIS — I10 Essential (primary) hypertension: Secondary | ICD-10-CM | POA: Diagnosis not present

## 2017-02-11 DIAGNOSIS — F411 Generalized anxiety disorder: Secondary | ICD-10-CM | POA: Diagnosis not present

## 2017-02-11 DIAGNOSIS — R002 Palpitations: Secondary | ICD-10-CM | POA: Diagnosis not present

## 2017-02-11 DIAGNOSIS — Z8249 Family history of ischemic heart disease and other diseases of the circulatory system: Secondary | ICD-10-CM | POA: Diagnosis not present

## 2017-02-12 DIAGNOSIS — M6281 Muscle weakness (generalized): Secondary | ICD-10-CM | POA: Diagnosis not present

## 2017-02-12 DIAGNOSIS — R293 Abnormal posture: Secondary | ICD-10-CM | POA: Diagnosis not present

## 2017-02-12 DIAGNOSIS — M542 Cervicalgia: Secondary | ICD-10-CM | POA: Diagnosis not present

## 2017-03-05 ENCOUNTER — Encounter: Payer: Self-pay | Admitting: *Deleted

## 2017-03-05 NOTE — Progress Notes (Signed)
Patient ID: Carol MarkerClaudia Lasater Duncan, female   DOB: January 29, 1951, 66 y.o.   MRN: [id number] Patient did not show up for 03/05/2017, 3:30 PM, appointment, to have a 48 hour holter monitor applied.

## 2017-03-13 DIAGNOSIS — M47892 Other spondylosis, cervical region: Secondary | ICD-10-CM | POA: Diagnosis not present

## 2017-03-13 DIAGNOSIS — I10 Essential (primary) hypertension: Secondary | ICD-10-CM | POA: Diagnosis not present

## 2017-03-13 DIAGNOSIS — Z6827 Body mass index (BMI) 27.0-27.9, adult: Secondary | ICD-10-CM | POA: Diagnosis not present

## 2017-03-13 DIAGNOSIS — Z79899 Other long term (current) drug therapy: Secondary | ICD-10-CM | POA: Diagnosis not present

## 2017-03-13 DIAGNOSIS — M4802 Spinal stenosis, cervical region: Secondary | ICD-10-CM | POA: Diagnosis not present

## 2017-03-22 DIAGNOSIS — M47892 Other spondylosis, cervical region: Secondary | ICD-10-CM | POA: Diagnosis not present

## 2017-03-22 DIAGNOSIS — M4722 Other spondylosis with radiculopathy, cervical region: Secondary | ICD-10-CM | POA: Diagnosis not present

## 2017-03-22 DIAGNOSIS — M4802 Spinal stenosis, cervical region: Secondary | ICD-10-CM | POA: Diagnosis not present

## 2017-03-22 DIAGNOSIS — M4712 Other spondylosis with myelopathy, cervical region: Secondary | ICD-10-CM | POA: Diagnosis not present

## 2017-03-27 ENCOUNTER — Telehealth: Payer: Self-pay | Admitting: Internal Medicine

## 2017-03-27 NOTE — Telephone Encounter (Signed)
Our office has reached out to patient several times to get scheduled for the monitor appt. The patient has no showed and cancelled with no response to our calls to her office. Please see notes below. I have removed the order from the work que.   03/27/2017 LMOM for pt to call and schedule monitor appt. sent a message to referring Dr letting them know pt has not responded back to our calls. stpegram  03/11/2017 LMOM for pt to call and schedule monitor appt. stpegram 03/05/2017 NO SHOW 02/25/2017 pt cxl due to weather  Lamar Laundry

## 2017-04-03 DIAGNOSIS — M542 Cervicalgia: Secondary | ICD-10-CM | POA: Diagnosis not present

## 2017-04-03 DIAGNOSIS — M4722 Other spondylosis with radiculopathy, cervical region: Secondary | ICD-10-CM | POA: Diagnosis not present

## 2017-04-03 DIAGNOSIS — M791 Myalgia: Secondary | ICD-10-CM | POA: Diagnosis not present

## 2017-04-08 DIAGNOSIS — M48062 Spinal stenosis, lumbar region with neurogenic claudication: Secondary | ICD-10-CM | POA: Diagnosis not present

## 2017-04-11 ENCOUNTER — Ambulatory Visit: Payer: Medicare Other | Admitting: Podiatry

## 2017-04-18 DIAGNOSIS — F3170 Bipolar disorder, currently in remission, most recent episode unspecified: Secondary | ICD-10-CM | POA: Diagnosis not present

## 2017-04-18 DIAGNOSIS — F411 Generalized anxiety disorder: Secondary | ICD-10-CM | POA: Diagnosis not present

## 2017-04-22 DIAGNOSIS — M542 Cervicalgia: Secondary | ICD-10-CM | POA: Diagnosis not present

## 2017-04-22 DIAGNOSIS — M47892 Other spondylosis, cervical region: Secondary | ICD-10-CM | POA: Diagnosis not present

## 2017-04-25 DIAGNOSIS — M545 Low back pain: Secondary | ICD-10-CM | POA: Diagnosis not present

## 2017-04-25 DIAGNOSIS — M542 Cervicalgia: Secondary | ICD-10-CM | POA: Diagnosis not present

## 2017-04-30 DIAGNOSIS — R946 Abnormal results of thyroid function studies: Secondary | ICD-10-CM | POA: Diagnosis not present

## 2017-04-30 DIAGNOSIS — M5412 Radiculopathy, cervical region: Secondary | ICD-10-CM | POA: Diagnosis not present

## 2017-04-30 DIAGNOSIS — K581 Irritable bowel syndrome with constipation: Secondary | ICD-10-CM | POA: Diagnosis not present

## 2017-04-30 DIAGNOSIS — E039 Hypothyroidism, unspecified: Secondary | ICD-10-CM | POA: Diagnosis not present

## 2017-04-30 DIAGNOSIS — F411 Generalized anxiety disorder: Secondary | ICD-10-CM | POA: Diagnosis not present

## 2017-04-30 DIAGNOSIS — I10 Essential (primary) hypertension: Secondary | ICD-10-CM | POA: Diagnosis not present

## 2017-04-30 DIAGNOSIS — R911 Solitary pulmonary nodule: Secondary | ICD-10-CM | POA: Diagnosis not present

## 2017-04-30 DIAGNOSIS — R002 Palpitations: Secondary | ICD-10-CM | POA: Diagnosis not present

## 2017-04-30 DIAGNOSIS — Z8249 Family history of ischemic heart disease and other diseases of the circulatory system: Secondary | ICD-10-CM | POA: Diagnosis not present

## 2017-04-30 DIAGNOSIS — M797 Fibromyalgia: Secondary | ICD-10-CM | POA: Diagnosis not present

## 2017-05-02 DIAGNOSIS — M79605 Pain in left leg: Secondary | ICD-10-CM | POA: Diagnosis not present

## 2017-05-02 DIAGNOSIS — M79652 Pain in left thigh: Secondary | ICD-10-CM | POA: Diagnosis not present

## 2017-05-02 DIAGNOSIS — M79604 Pain in right leg: Secondary | ICD-10-CM | POA: Diagnosis not present

## 2017-05-02 DIAGNOSIS — M79651 Pain in right thigh: Secondary | ICD-10-CM | POA: Diagnosis not present

## 2017-05-03 ENCOUNTER — Other Ambulatory Visit: Payer: Self-pay | Admitting: Internal Medicine

## 2017-05-03 ENCOUNTER — Ambulatory Visit
Admission: RE | Admit: 2017-05-03 | Discharge: 2017-05-03 | Disposition: A | Discharge status: Home / Self Care | Payer: Medicare Other | Source: Ambulatory Visit | Attending: Internal Medicine | Admitting: Internal Medicine

## 2017-05-03 DIAGNOSIS — R911 Solitary pulmonary nodule: Secondary | ICD-10-CM

## 2017-05-03 DIAGNOSIS — R918 Other nonspecific abnormal finding of lung field: Secondary | ICD-10-CM | POA: Diagnosis not present

## 2017-05-06 DIAGNOSIS — Z79891 Long term (current) use of opiate analgesic: Secondary | ICD-10-CM | POA: Diagnosis not present

## 2017-05-06 DIAGNOSIS — M48062 Spinal stenosis, lumbar region with neurogenic claudication: Secondary | ICD-10-CM | POA: Diagnosis not present

## 2017-05-06 DIAGNOSIS — M47815 Spondylosis without myelopathy or radiculopathy, thoracolumbar region: Secondary | ICD-10-CM | POA: Diagnosis not present

## 2017-05-06 DIAGNOSIS — G894 Chronic pain syndrome: Secondary | ICD-10-CM | POA: Diagnosis not present

## 2017-05-06 DIAGNOSIS — M47812 Spondylosis without myelopathy or radiculopathy, cervical region: Secondary | ICD-10-CM | POA: Diagnosis not present

## 2017-05-27 DIAGNOSIS — M47815 Spondylosis without myelopathy or radiculopathy, thoracolumbar region: Secondary | ICD-10-CM | POA: Diagnosis not present

## 2017-05-27 DIAGNOSIS — M47812 Spondylosis without myelopathy or radiculopathy, cervical region: Secondary | ICD-10-CM | POA: Diagnosis not present

## 2017-05-27 DIAGNOSIS — Z79891 Long term (current) use of opiate analgesic: Secondary | ICD-10-CM | POA: Diagnosis not present

## 2017-05-27 DIAGNOSIS — G894 Chronic pain syndrome: Secondary | ICD-10-CM | POA: Diagnosis not present

## 2017-05-31 DIAGNOSIS — M4802 Spinal stenosis, cervical region: Secondary | ICD-10-CM | POA: Diagnosis not present

## 2017-05-31 DIAGNOSIS — M4712 Other spondylosis with myelopathy, cervical region: Secondary | ICD-10-CM | POA: Diagnosis not present

## 2017-05-31 DIAGNOSIS — M5412 Radiculopathy, cervical region: Secondary | ICD-10-CM | POA: Diagnosis not present

## 2017-05-31 DIAGNOSIS — M4722 Other spondylosis with radiculopathy, cervical region: Secondary | ICD-10-CM | POA: Diagnosis not present

## 2017-06-05 DIAGNOSIS — M4802 Spinal stenosis, cervical region: Secondary | ICD-10-CM | POA: Diagnosis not present

## 2017-06-12 DIAGNOSIS — M797 Fibromyalgia: Secondary | ICD-10-CM | POA: Diagnosis present

## 2017-06-12 DIAGNOSIS — M50121 Cervical disc disorder at C4-C5 level with radiculopathy: Secondary | ICD-10-CM | POA: Diagnosis not present

## 2017-06-12 DIAGNOSIS — M4802 Spinal stenosis, cervical region: Secondary | ICD-10-CM | POA: Diagnosis not present

## 2017-06-12 DIAGNOSIS — M5412 Radiculopathy, cervical region: Secondary | ICD-10-CM | POA: Diagnosis not present

## 2017-06-12 DIAGNOSIS — M4712 Other spondylosis with myelopathy, cervical region: Secondary | ICD-10-CM | POA: Diagnosis not present

## 2017-06-12 DIAGNOSIS — I10 Essential (primary) hypertension: Secondary | ICD-10-CM | POA: Diagnosis present

## 2017-06-12 DIAGNOSIS — M50021 Cervical disc disorder at C4-C5 level with myelopathy: Secondary | ICD-10-CM | POA: Diagnosis not present

## 2017-06-12 DIAGNOSIS — M4722 Other spondylosis with radiculopathy, cervical region: Secondary | ICD-10-CM | POA: Diagnosis not present

## 2017-06-12 DIAGNOSIS — G43909 Migraine, unspecified, not intractable, without status migrainosus: Secondary | ICD-10-CM | POA: Diagnosis present

## 2017-06-12 DIAGNOSIS — M4322 Fusion of spine, cervical region: Secondary | ICD-10-CM | POA: Diagnosis not present

## 2017-06-27 DIAGNOSIS — M47812 Spondylosis without myelopathy or radiculopathy, cervical region: Secondary | ICD-10-CM | POA: Diagnosis not present

## 2017-06-27 DIAGNOSIS — G894 Chronic pain syndrome: Secondary | ICD-10-CM | POA: Diagnosis not present

## 2017-06-27 DIAGNOSIS — M47815 Spondylosis without myelopathy or radiculopathy, thoracolumbar region: Secondary | ICD-10-CM | POA: Diagnosis not present

## 2017-06-27 DIAGNOSIS — Z79891 Long term (current) use of opiate analgesic: Secondary | ICD-10-CM | POA: Diagnosis not present

## 2017-07-12 DIAGNOSIS — M5412 Radiculopathy, cervical region: Secondary | ICD-10-CM | POA: Diagnosis not present

## 2017-07-12 DIAGNOSIS — Z981 Arthrodesis status: Secondary | ICD-10-CM | POA: Diagnosis not present

## 2017-07-18 DIAGNOSIS — J301 Allergic rhinitis due to pollen: Secondary | ICD-10-CM | POA: Diagnosis not present

## 2017-07-18 DIAGNOSIS — B309 Viral conjunctivitis, unspecified: Secondary | ICD-10-CM | POA: Diagnosis not present

## 2017-07-18 DIAGNOSIS — J029 Acute pharyngitis, unspecified: Secondary | ICD-10-CM | POA: Diagnosis not present

## 2017-07-22 DIAGNOSIS — R300 Dysuria: Secondary | ICD-10-CM | POA: Diagnosis not present

## 2017-07-22 DIAGNOSIS — R1030 Lower abdominal pain, unspecified: Secondary | ICD-10-CM | POA: Diagnosis not present

## 2017-07-24 DIAGNOSIS — R1030 Lower abdominal pain, unspecified: Secondary | ICD-10-CM | POA: Diagnosis not present

## 2017-07-24 DIAGNOSIS — M545 Low back pain: Secondary | ICD-10-CM | POA: Diagnosis not present

## 2017-07-24 DIAGNOSIS — H1013 Acute atopic conjunctivitis, bilateral: Secondary | ICD-10-CM | POA: Diagnosis not present

## 2017-08-06 DIAGNOSIS — M47812 Spondylosis without myelopathy or radiculopathy, cervical region: Secondary | ICD-10-CM | POA: Diagnosis not present

## 2017-08-06 DIAGNOSIS — M47815 Spondylosis without myelopathy or radiculopathy, thoracolumbar region: Secondary | ICD-10-CM | POA: Diagnosis not present

## 2017-08-06 DIAGNOSIS — G894 Chronic pain syndrome: Secondary | ICD-10-CM | POA: Diagnosis not present

## 2017-08-06 DIAGNOSIS — Z79891 Long term (current) use of opiate analgesic: Secondary | ICD-10-CM | POA: Diagnosis not present

## 2017-08-06 DIAGNOSIS — M25552 Pain in left hip: Secondary | ICD-10-CM | POA: Diagnosis not present

## 2017-08-06 DIAGNOSIS — M41125 Adolescent idiopathic scoliosis, thoracolumbar region: Secondary | ICD-10-CM | POA: Diagnosis not present

## 2017-08-06 DIAGNOSIS — M40204 Unspecified kyphosis, thoracic region: Secondary | ICD-10-CM | POA: Diagnosis not present

## 2017-09-03 DIAGNOSIS — G894 Chronic pain syndrome: Secondary | ICD-10-CM | POA: Diagnosis not present

## 2017-09-03 DIAGNOSIS — Z79891 Long term (current) use of opiate analgesic: Secondary | ICD-10-CM | POA: Diagnosis not present

## 2017-09-03 DIAGNOSIS — M47815 Spondylosis without myelopathy or radiculopathy, thoracolumbar region: Secondary | ICD-10-CM | POA: Diagnosis not present

## 2017-09-03 DIAGNOSIS — M40204 Unspecified kyphosis, thoracic region: Secondary | ICD-10-CM | POA: Diagnosis not present

## 2017-09-19 DIAGNOSIS — F3170 Bipolar disorder, currently in remission, most recent episode unspecified: Secondary | ICD-10-CM | POA: Diagnosis not present

## 2017-09-19 DIAGNOSIS — F411 Generalized anxiety disorder: Secondary | ICD-10-CM | POA: Diagnosis not present

## 2017-09-20 DIAGNOSIS — M4802 Spinal stenosis, cervical region: Secondary | ICD-10-CM | POA: Diagnosis not present

## 2017-09-20 DIAGNOSIS — M5412 Radiculopathy, cervical region: Secondary | ICD-10-CM | POA: Diagnosis not present

## 2017-09-20 DIAGNOSIS — M4712 Other spondylosis with myelopathy, cervical region: Secondary | ICD-10-CM | POA: Diagnosis not present

## 2017-09-20 DIAGNOSIS — M4722 Other spondylosis with radiculopathy, cervical region: Secondary | ICD-10-CM | POA: Diagnosis not present

## 2017-09-20 DIAGNOSIS — M47892 Other spondylosis, cervical region: Secondary | ICD-10-CM | POA: Diagnosis not present

## 2017-10-01 DIAGNOSIS — M40204 Unspecified kyphosis, thoracic region: Secondary | ICD-10-CM | POA: Diagnosis not present

## 2017-10-01 DIAGNOSIS — Z79891 Long term (current) use of opiate analgesic: Secondary | ICD-10-CM | POA: Diagnosis not present

## 2017-10-01 DIAGNOSIS — G894 Chronic pain syndrome: Secondary | ICD-10-CM | POA: Diagnosis not present

## 2017-10-01 DIAGNOSIS — M47815 Spondylosis without myelopathy or radiculopathy, thoracolumbar region: Secondary | ICD-10-CM | POA: Diagnosis not present

## 2017-10-17 DIAGNOSIS — F3170 Bipolar disorder, currently in remission, most recent episode unspecified: Secondary | ICD-10-CM | POA: Diagnosis not present

## 2017-10-17 DIAGNOSIS — F411 Generalized anxiety disorder: Secondary | ICD-10-CM | POA: Diagnosis not present

## 2017-10-29 DIAGNOSIS — Z79891 Long term (current) use of opiate analgesic: Secondary | ICD-10-CM | POA: Diagnosis not present

## 2017-10-29 DIAGNOSIS — M40204 Unspecified kyphosis, thoracic region: Secondary | ICD-10-CM | POA: Diagnosis not present

## 2017-10-29 DIAGNOSIS — G894 Chronic pain syndrome: Secondary | ICD-10-CM | POA: Diagnosis not present

## 2017-10-29 DIAGNOSIS — M47815 Spondylosis without myelopathy or radiculopathy, thoracolumbar region: Secondary | ICD-10-CM | POA: Diagnosis not present

## 2017-11-19 DIAGNOSIS — K581 Irritable bowel syndrome with constipation: Secondary | ICD-10-CM | POA: Diagnosis not present

## 2017-11-19 DIAGNOSIS — Z1389 Encounter for screening for other disorder: Secondary | ICD-10-CM | POA: Diagnosis not present

## 2017-11-19 DIAGNOSIS — F411 Generalized anxiety disorder: Secondary | ICD-10-CM | POA: Diagnosis not present

## 2017-11-19 DIAGNOSIS — E039 Hypothyroidism, unspecified: Secondary | ICD-10-CM | POA: Diagnosis not present

## 2017-11-19 DIAGNOSIS — Z8249 Family history of ischemic heart disease and other diseases of the circulatory system: Secondary | ICD-10-CM | POA: Diagnosis not present

## 2017-11-19 DIAGNOSIS — Z23 Encounter for immunization: Secondary | ICD-10-CM | POA: Diagnosis not present

## 2017-11-19 DIAGNOSIS — R002 Palpitations: Secondary | ICD-10-CM | POA: Diagnosis not present

## 2017-11-19 DIAGNOSIS — R911 Solitary pulmonary nodule: Secondary | ICD-10-CM | POA: Diagnosis not present

## 2017-11-19 DIAGNOSIS — Z0000 Encounter for general adult medical examination without abnormal findings: Secondary | ICD-10-CM | POA: Diagnosis not present

## 2017-11-19 DIAGNOSIS — M797 Fibromyalgia: Secondary | ICD-10-CM | POA: Diagnosis not present

## 2017-11-19 DIAGNOSIS — I10 Essential (primary) hypertension: Secondary | ICD-10-CM | POA: Diagnosis not present

## 2017-11-22 DIAGNOSIS — F3170 Bipolar disorder, currently in remission, most recent episode unspecified: Secondary | ICD-10-CM | POA: Diagnosis not present

## 2017-11-22 DIAGNOSIS — F411 Generalized anxiety disorder: Secondary | ICD-10-CM | POA: Diagnosis not present

## 2017-11-27 DIAGNOSIS — M40204 Unspecified kyphosis, thoracic region: Secondary | ICD-10-CM | POA: Diagnosis not present

## 2017-11-27 DIAGNOSIS — M47815 Spondylosis without myelopathy or radiculopathy, thoracolumbar region: Secondary | ICD-10-CM | POA: Diagnosis not present

## 2017-11-27 DIAGNOSIS — Z79891 Long term (current) use of opiate analgesic: Secondary | ICD-10-CM | POA: Diagnosis not present

## 2017-11-27 DIAGNOSIS — G894 Chronic pain syndrome: Secondary | ICD-10-CM | POA: Diagnosis not present

## 2017-12-21 ENCOUNTER — Encounter (HOSPITAL_COMMUNITY): Payer: Self-pay | Admitting: Emergency Medicine

## 2017-12-21 ENCOUNTER — Emergency Department (HOSPITAL_COMMUNITY): Payer: Medicare Other

## 2017-12-21 ENCOUNTER — Emergency Department (HOSPITAL_COMMUNITY)
Admission: EM | Admit: 2017-12-21 | Discharge: 2017-12-21 | Disposition: A | Discharge status: Home / Self Care | Payer: Medicare Other | Attending: Emergency Medicine | Admitting: Emergency Medicine

## 2017-12-21 DIAGNOSIS — Z79899 Other long term (current) drug therapy: Secondary | ICD-10-CM | POA: Insufficient documentation

## 2017-12-21 DIAGNOSIS — R319 Hematuria, unspecified: Secondary | ICD-10-CM

## 2017-12-21 DIAGNOSIS — R1084 Generalized abdominal pain: Secondary | ICD-10-CM | POA: Diagnosis not present

## 2017-12-21 DIAGNOSIS — R310 Gross hematuria: Secondary | ICD-10-CM | POA: Diagnosis not present

## 2017-12-21 DIAGNOSIS — I10 Essential (primary) hypertension: Secondary | ICD-10-CM | POA: Insufficient documentation

## 2017-12-21 DIAGNOSIS — R1012 Left upper quadrant pain: Secondary | ICD-10-CM | POA: Diagnosis not present

## 2017-12-21 DIAGNOSIS — R112 Nausea with vomiting, unspecified: Secondary | ICD-10-CM | POA: Diagnosis not present

## 2017-12-21 DIAGNOSIS — R531 Weakness: Secondary | ICD-10-CM | POA: Diagnosis not present

## 2017-12-21 DIAGNOSIS — N390 Urinary tract infection, site not specified: Secondary | ICD-10-CM

## 2017-12-21 LAB — URINALYSIS, MICROSCOPIC (REFLEX)

## 2017-12-21 LAB — COMPREHENSIVE METABOLIC PANEL
ALT: 13 U/L — ABNORMAL LOW (ref 14–54)
AST: 26 U/L (ref 15–41)
Albumin: 4.4 g/dL (ref 3.5–5.0)
Alkaline Phosphatase: 55 U/L (ref 38–126)
Anion gap: 12 (ref 5–15)
BUN: 11 mg/dL (ref 6–20)
CO2: 27 mmol/L (ref 22–32)
Calcium: 9.4 mg/dL (ref 8.9–10.3)
Chloride: 97 mmol/L — ABNORMAL LOW (ref 101–111)
Creatinine, Ser: 0.77 mg/dL (ref 0.44–1.00)
GFR calc Af Amer: >60 mL/min (ref >60)
GFR calc non Af Amer: >60 mL/min (ref >60)
Glucose, Bld: 142 mg/dL — ABNORMAL HIGH (ref 65–99)
Potassium: 4 mmol/L (ref 3.5–5.1)
Sodium: 136 mmol/L (ref 135–145)
Total Bilirubin: 0.9 mg/dL (ref 0.3–1.2)
Total Protein: 8 g/dL (ref 6.5–8.1)

## 2017-12-21 LAB — CBC
HCT: 44.5 % (ref 36.0–46.0)
Hemoglobin: 15.5 g/dL — ABNORMAL HIGH (ref 12.0–15.0)
MCH: 31.6 pg (ref 26.0–34.0)
MCHC: 34.8 g/dL (ref 30.0–36.0)
MCV: 90.6 fL (ref 78.0–100.0)
Platelets: 262 10*3/uL (ref 150–400)
RBC: 4.91 MIL/uL (ref 3.87–5.11)
RDW: 12.9 % (ref 11.5–15.5)
WBC: 14.4 10*3/uL — ABNORMAL HIGH (ref 4.0–10.5)

## 2017-12-21 LAB — URINALYSIS, ROUTINE W REFLEX MICROSCOPIC
Glucose, UA: NEGATIVE mg/dL
Ketones, ur: 5 mg/dL — AB
Leukocytes, UA: NEGATIVE
Nitrite: NEGATIVE
Protein, ur: 100 mg/dL — AB
Specific Gravity, Urine: 1.025 (ref 1.005–1.030)
pH: 7.5 (ref 5.0–8.0)

## 2017-12-21 LAB — LIPASE, BLOOD: Lipase: 23 U/L (ref 11–51)

## 2017-12-21 MED ORDER — METOCLOPRAMIDE HCL 5 MG/ML IJ SOLN
10.0000 mg | Freq: Once | INTRAMUSCULAR | Status: AC
  Administered 2017-12-21: 10 mg via INTRAVENOUS
  Filled 2017-12-21: qty 2

## 2017-12-21 MED ORDER — IOPAMIDOL (ISOVUE-300) INJECTION 61%
INTRAVENOUS | Status: AC
  Administered 2017-12-21: 100 mL via INTRAVENOUS
  Filled 2017-12-21: qty 100

## 2017-12-21 MED ORDER — ONDANSETRON HCL 4 MG/2ML IJ SOLN
4.0000 mg | Freq: Once | INTRAMUSCULAR | Status: AC
  Administered 2017-12-21: 4 mg via INTRAVENOUS
  Filled 2017-12-21: qty 2

## 2017-12-21 MED ORDER — ONDANSETRON 4 MG PO TBDP
4.0000 mg | ORAL_TABLET | Freq: Once | ORAL | Status: AC | PRN
  Administered 2017-12-21: 4 mg via ORAL
  Filled 2017-12-21: qty 1

## 2017-12-21 MED ORDER — HYDROMORPHONE HCL 1 MG/ML IJ SOLN
1.0000 mg | Freq: Once | INTRAMUSCULAR | Status: AC
  Administered 2017-12-21: 1 mg via INTRAVENOUS
  Filled 2017-12-21: qty 1

## 2017-12-21 MED ORDER — SODIUM CHLORIDE 0.9 % IV BOLUS (SEPSIS)
1000.0000 mL | Freq: Once | INTRAVENOUS | Status: AC
  Administered 2017-12-21: 1000 mL via INTRAVENOUS

## 2017-12-21 MED ORDER — ONDANSETRON HCL 8 MG PO TABS: 8.0000 mg | ORAL_TABLET | Freq: Three times a day (TID) | ORAL | 0 refills | Status: DC | PRN

## 2017-12-21 MED ORDER — CEPHALEXIN 500 MG PO CAPS: 500.0000 mg | ORAL_CAPSULE | Freq: Three times a day (TID) | ORAL | 0 refills | Status: DC

## 2017-12-21 NOTE — ED Notes (Signed)
Pt has high bh readings 209/122 and 201/110, pt states feeling fainty and headache. Tresa EndoKelly RN advised. [employer]

## 2017-12-21 NOTE — ED Provider Notes (Signed)
[facility]-EMERGENCY DEPT Provider Note   CSN: [id number] Arrival date & time: 12/21/17  0857     History   Chief Complaint Chief Complaint  Patient presents with  . Abdominal Pain    HPI Carol Duncan is a 67 y.o. female.  HPI   67 year old female with hx of diverticulosis, HTN, cholelithiasis and past surgical hx of including hysterectomy, appendectomy, and cholecystectomy presenting c/o abd pain. Pt developed gradual onset of LUQ abd pain last night.  Pain started approximately 1 hr after dinner.  sts she had 2 glasses of wine and ate a large amount of lasagna (the kind of food she rarely ate due to being gluten intolerance).  Pain is sharp, non radiating and persistent since.  Rates pain as 11/10.  Endorse nausea and have vomited 3 episodes with food content, also endorse loose stools without blood or mucous.  Endorse chills and weak.  Denies fever, cp, sob, productive cough, dysuria or rash.  Sts she has had approximately 7 similar episodes of abd pain in the past 9 years.  In March of last year she had her gall bladder removed and have been sxs free until today.  Sts usually zofran and dilaudid has helped in the past  She did took zofran last night and pepto bismo without relief.    Past Medical History:  Diagnosis Date  . Anxiety   . Bile duct abnormality   . Complication of anesthesia    Takes alot to keep patient under anesthesia  . Diverticulitis   . Hypertension     Patient Active Problem List   Diagnosis Date Noted  . Symptomatic cholelithiasis   . Essential hypertension   . Hypokalemia   . Hypomagnesemia   . Epigastric abdominal pain 02/27/2016  . Cholelithiasis 02/27/2016  . Hypertension 02/27/2016  . Biliary colic 02/27/2016  . Acute cholecystitis 12/01/2013  . External hemorrhoid, thrombosed 09/08/2013    Past Surgical History:  Procedure Laterality Date  . ABDOMINAL HYSTERECTOMY    . APPENDECTOMY     during hysterectomy    . CHOLECYSTECTOMY N/A 02/27/2016   Procedure: LAPAROSCOPIC CHOLECYSTECTOMY WITH INTRAOPERATIVE CHOLANGIOGRAM;  Surgeon: Romie Levee, MD;  Location: [facility] ORS;  Service: General;  Laterality: N/A;  . myomectomy      OB History    No data available       Home Medications    Prior to Admission medications   Medication Sig Start Date End Date Taking? Authorizing Provider  ALPRAZolam Prudy Feeler) 1 MG tablet Take 0.5 mg by mouth 3 (three) times daily as needed for anxiety.     [provider]  amLODipine (NORVASC) 2.5 MG tablet Take 1 tablet (2.5 mg total) by mouth daily. 02/28/16   Hongalgi, Maximino Greenland, MD  Biotin 5000 MCG CAPS Take 10,000 mcg by mouth daily.     [provider]  gabapentin (NEURONTIN) 300 MG capsule Take 300-600 mg by mouth at bedtime. Take 300mg s daily at bedtime for 7 days then take 600mg s daily 02/17/16   [provider]  HYDROcodone-acetaminophen (NORCO) 10-325 MG per tablet Take 1-2 tablets by mouth every 6 (six) hours as needed for severe pain (takes one-half tablet).     [provider]  lamoTRIgine (LAMICTAL) 200 MG tablet Take 200 mg by mouth every morning.    [provider]  LINZESS 145 MCG CAPS capsule Take 145 mg by mouth daily. 12/27/15   [provider]  lithium carbonate 150 MG capsule Take  150 mg by mouth daily. 02/20/16   [provider]  ondansetron (ZOFRAN) 8 MG tablet Take 8 mg by mouth every 8 (eight) hours as needed for nausea or vomiting.  08/08/15   [provider]  pramipexole (MIRAPEX) 0.5 MG tablet Take 1.5 mg by mouth at bedtime as needed (for sleep).     [provider]  thyroid (ARMOUR) 30 MG tablet Take 30 mg by mouth 2 (two) times daily.    [provider]  traMADol (ULTRAM) 50 MG tablet Take 100 mg by mouth 2 (two) times daily as needed for moderate pain.  01/31/16   [provider]  valACYclovir (VALTREX) 500 MG tablet Take 500 mg by mouth 2 (two) times daily.  As needed for outbreaks 09/23/15   [provider]    Family History Family History  Problem Relation Age of Onset  . Brain cancer Mother   . Heart attack Father     Social History Social History   Tobacco Use  . Smoking status: Never Smoker  . Smokeless tobacco: Never Used  Substance Use Topics  . Alcohol use: Yes    Comment: very rare  . Drug use: No     Allergies   Shellfish allergy and Wheat bran   Review of Systems Review of Systems  All other systems reviewed and are negative.    Physical Exam Updated Vital Signs BP (!) 201/110 (BP Location: Right Arm)   Pulse 85   Temp 98.9 F (37.2 C) (Oral)   Resp 18   Ht 5' 1.5" (1.562 m)   Wt 63.5 kg (140 lb)   SpO2 98%   BMI 26.02 kg/m   Physical Exam  Constitutional: She appears well-developed and well-nourished. No distress.  HENT:  Head: Atraumatic.  Eyes: Conjunctivae are normal.  Neck: Neck supple.  Cardiovascular: Normal rate and regular rhythm.  Pulmonary/Chest: Effort normal and breath sounds normal.  Abdominal: Soft. Normal appearance. There is generalized tenderness. There is no rebound, no guarding, no tenderness at McBurney's point and negative Murphy's sign. Hernia confirmed negative in the right inguinal area and confirmed negative in the left inguinal area.  Neurological: She is alert.  Skin: No rash noted.  Psychiatric: She has a normal mood and affect.  Nursing note and vitals reviewed.    ED Treatments / Results  Labs (all labs ordered are listed, but only abnormal results are displayed) Labs Reviewed  COMPREHENSIVE METABOLIC PANEL - Abnormal; Notable for the following components:      Result Value   Chloride 97 (*)    Glucose, Bld 142 (*)    ALT 13 (*)    All other components within normal limits  CBC - Abnormal; Notable for the following components:   WBC 14.4 (*)    Hemoglobin 15.5 (*)    All other components within normal limits  URINALYSIS, ROUTINE W REFLEX  MICROSCOPIC - Abnormal; Notable for the following components:   Color, Urine BROWN (*)    APPearance TURBID (*)    Hgb urine dipstick LARGE (*)    Bilirubin Urine SMALL (*)    Ketones, ur 5 (*)    Protein, ur 100 (*)    All other components within normal limits  URINALYSIS, MICROSCOPIC (REFLEX) - Abnormal; Notable for the following components:   Bacteria, UA MANY (*)    Squamous Epithelial / LPF 0-5 (*)    All other components within normal limits  URINE CULTURE  LIPASE, BLOOD    EKG  EKG Interpretation  Date/Time:  Saturday December 21 2017 15:13:23 EST Ventricular Rate:  69 PR Interval:    QRS Duration: 92 QT Interval:  439 QTC Calculation: 471 R Axis:   -7 Text Interpretation:  Sinus rhythm LAE, consider biatrial enlargement No significant change since last tracing Confirmed by Linwood Dibbles [phone]) on 12/21/2017 3:21:27 PM       Radiology Ct Abdomen Pelvis W Contrast  Result Date: 12/21/2017 CLINICAL DATA:  Pt c/o LUQ pain with n/v/d since yesterday. Reports that has this happen every so often and have to get shots of Zofran and Dilaudid. Status post appendectomy, hysterectomy. EXAM: CT ABDOMEN AND PELVIS WITH CONTRAST TECHNIQUE: Multidetector CT imaging of the abdomen and pelvis was performed using the standard protocol following bolus administration of intravenous contrast. CONTRAST:  ISOVUE-300 IOPAMIDOL (ISOVUE-300) INJECTION 61% COMPARISON:  One 8,016 FINDINGS: Lower chest: Lung bases are clear. Heart size is normal. There is mild atherosclerotic calcification of coronary vessels. Hepatobiliary: Small left hepatic cyst is stable in appearance, 1 cm in diameter. Other smaller cyst is present in the right hepatic lobe. No suspicious liver lesions. Status post cholecystectomy. Pancreas: Unremarkable. No pancreatic ductal dilatation or surrounding inflammatory changes. Spleen: Multiple calcified granulomata are identified within the spleen. Adrenals/Urinary Tract: Adrenal glands are  normal in appearance. Numerous small renal cysts are present. No suspicious renal lesion. No hydronephrosis. Urinary bladder is unremarkable. Stomach/Bowel: The stomach and small bowel loops are normal in appearance. Colon is normal in appearance. Status post appendectomy. Vascular/Lymphatic: There is mild atherosclerotic calcification of the abdominal aorta. There is normal vascular opacification of the celiac axis, superior mesenteric artery, and inferior mesenteric artery. Normal appearance of the portal venous system and inferior vena cava. No retroperitoneal or mesenteric adenopathy. Reproductive: Hysterectomy.  No adnexal mass. Other: None Musculoskeletal: Degenerative changes are seen in the lower lumbar spine. No acute fracture. IMPRESSION: 1.  No evidence for acute  abnormality. 2. No bowel obstruction.  Average stool burden. 3. Cholecystectomy. 4. Splenic granulomata. 5. Appendectomy. 6. Hysterectomy. Electronically Signed   By: Norva Pavlov M.D.   On: 12/21/2017 17:51    Procedures Procedures (including critical care time)  Medications Ordered in ED Medications  ondansetron (ZOFRAN-ODT) disintegrating tablet 4 mg (4 mg Oral Given 12/21/17 0925)     Initial Impression / Assessment and Plan / ED Course  I have reviewed the triage vital signs and the nursing notes.  Pertinent labs & imaging results that were available during my care of the patient were reviewed by me and considered in my medical decision making (see chart for details).     BP (!) 146/84 (BP Location: Left Arm)   Pulse 78   Temp 98.9 F (37.2 C) (Oral)   Resp 18   Ht 5' 1.5" (1.562 m)   Wt 63.5 kg (140 lb)   SpO2 98%   BMI 26.02 kg/m    Final Clinical Impressions(s) / ED Diagnoses   Final diagnoses:  Generalized abdominal pain  Urinary tract infection with hematuria, site unspecified    ED Discharge Orders        Ordered    cephALEXin (KEFLEX) 500 MG capsule  3 times daily     12/21/17 1805     ondansetron (ZOFRAN) 8 MG tablet  Every 8 hours PRN     12/21/17 1805     2:20 PM Pt with LUQ abd pain, has hx of this in the past.  Pain is post prandial.  Felt that her sxs is  due to the food and wine that she consumed.  Given her age, work up initiated, will give sxs treatment, and will obtain abd/pelvis CT for further evaluation. Pt found to be hypertensive, did not take her meds today.    3:16 PM Repeat BP shows improvement to 147/82.  Will obtain abd/pelvis CT once renal function level resulted and appropriate.   5:17 PM Pt report resolution of her sxs after receiving antinausea medication and pain medication.  She denies having dysuria.    6:02 PM Labs are reassuring. CT scan without any acute pathology noted. Urine with question or urinary tract infection and evidence of hemoglobin urine. I review prior results and patient has always had blood in the urine. 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 Her primary care provider is aware of this. Encouraged patient to discuss this again with her PCP. Patient will be discharged home with antibiotic, and antinausea medication. Return precaution discussed. Care discussed with Dr. Baltazar Apo, Greta Doom, PA-C 12/21/17 1806    Linwood Dibbles, MD 12/22/17 [phone]

## 2017-12-21 NOTE — Discharge Instructions (Signed)
You have been evaluated for your symptoms.  Your urine showing evidence of a urinary tract infection.  Please take antibiotic as prescribed.  There's blood in your urine, you will need to discuss this finding with your primary care provider.  Take zofran as needed for nausea.  Return if you have any concerns.

## 2017-12-21 NOTE — ED Notes (Signed)
Bed: WA08 Expected date:  Expected time:  Means of arrival:  Comments: 

## 2017-12-21 NOTE — ED Triage Notes (Signed)
Pt c/o LUQ pain with n/v/d since yesterday. Reports that has this happen every so often and have to get shots of Zofran and Dilaudid.

## 2017-12-21 NOTE — ED Notes (Signed)
Pt ambulatory and independent at discharge.  Verbalized understanding of discharge instructions 

## 2017-12-21 NOTE — ED Notes (Signed)
2 UNSUCCESSFUL COLLECTION ATTEMPTS

## 2017-12-23 LAB — URINE CULTURE

## 2018-01-02 DIAGNOSIS — G894 Chronic pain syndrome: Secondary | ICD-10-CM | POA: Diagnosis not present

## 2018-01-02 DIAGNOSIS — M47815 Spondylosis without myelopathy or radiculopathy, thoracolumbar region: Secondary | ICD-10-CM | POA: Diagnosis not present

## 2018-01-02 DIAGNOSIS — M40204 Unspecified kyphosis, thoracic region: Secondary | ICD-10-CM | POA: Diagnosis not present

## 2018-01-02 DIAGNOSIS — Z79891 Long term (current) use of opiate analgesic: Secondary | ICD-10-CM | POA: Diagnosis not present

## 2018-01-03 DIAGNOSIS — R350 Frequency of micturition: Secondary | ICD-10-CM | POA: Diagnosis not present

## 2018-01-03 DIAGNOSIS — N342 Other urethritis: Secondary | ICD-10-CM | POA: Diagnosis not present

## 2018-01-27 DIAGNOSIS — F3170 Bipolar disorder, currently in remission, most recent episode unspecified: Secondary | ICD-10-CM | POA: Diagnosis not present

## 2018-01-27 DIAGNOSIS — F411 Generalized anxiety disorder: Secondary | ICD-10-CM | POA: Diagnosis not present

## 2018-01-30 DIAGNOSIS — G894 Chronic pain syndrome: Secondary | ICD-10-CM | POA: Diagnosis not present

## 2018-01-30 DIAGNOSIS — M40204 Unspecified kyphosis, thoracic region: Secondary | ICD-10-CM | POA: Diagnosis not present

## 2018-01-30 DIAGNOSIS — Z79891 Long term (current) use of opiate analgesic: Secondary | ICD-10-CM | POA: Diagnosis not present

## 2018-01-30 DIAGNOSIS — M47815 Spondylosis without myelopathy or radiculopathy, thoracolumbar region: Secondary | ICD-10-CM | POA: Diagnosis not present

## 2018-02-23 IMAGING — CT CT ABD-PELV W/ CM
2 of 5 series · 16 of 46 positions shown, 18 images · IV contrast (ISOVUE)
Comparison: One 8,016

CLINICAL DATA: Pt c/o LUQ pain with n/v/d since yesterday. Reports
that has this happen every so often and have to get shots of Zofran
and Dilaudid. Status post appendectomy, hysterectomy.

EXAM:
CT ABDOMEN AND PELVIS WITH CONTRAST
TECHNIQUE: Multidetector CT imaging of the abdomen and pelvis was performed
using the standard protocol following bolus administration of
intravenous contrast.
CONTRAST:  BOLCUM-EDD IOPAMIDOL (BOLCUM-EDD) INJECTION 61%

[Series 2: axial st · axial · 0.85mm/px · z∈[+991,+1381]mm · 13 of 92 slices shown, 15 images]
[im 7/92  soft-tissue]
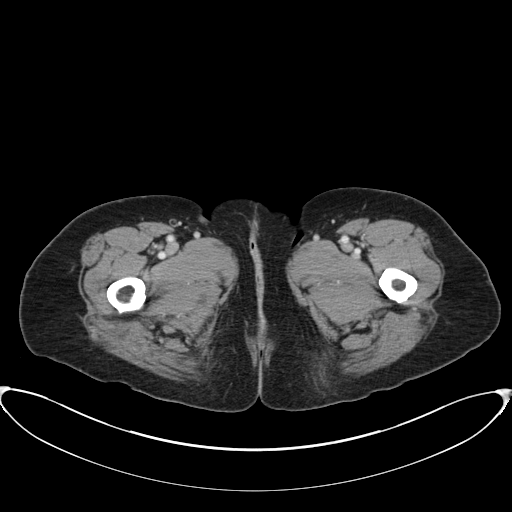
[im 7/92  bone]
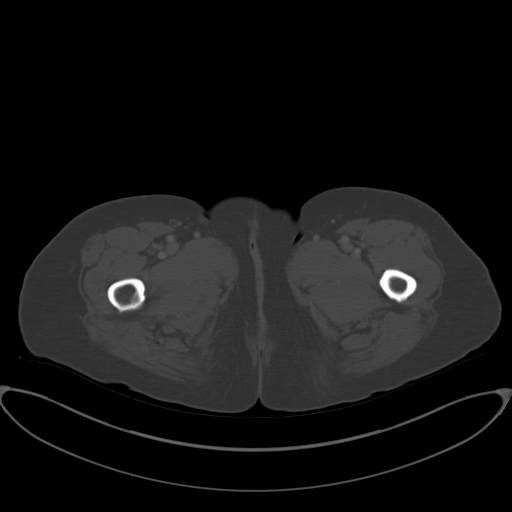
[im 13/92  soft-tissue]
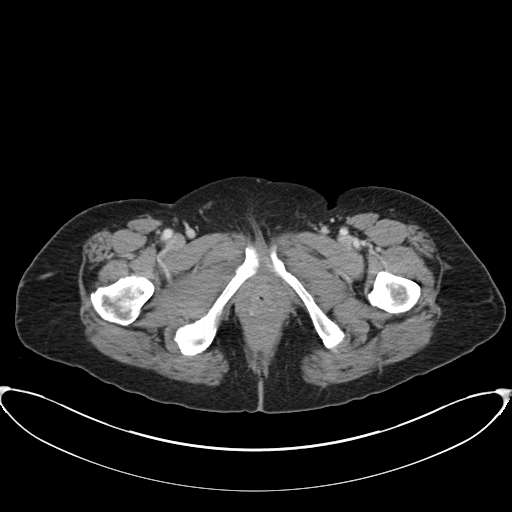
[im 19/92  soft-tissue]
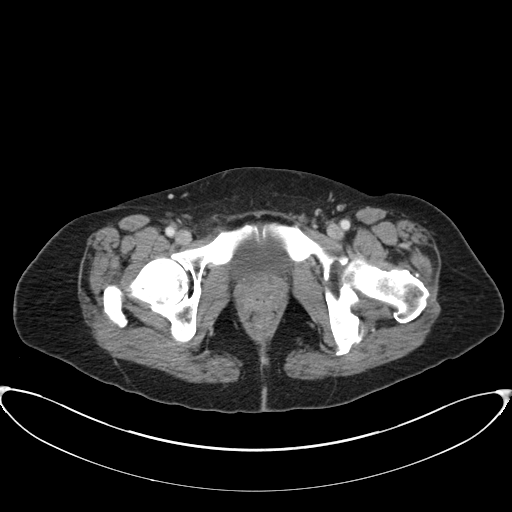
[im 25/92  soft-tissue]
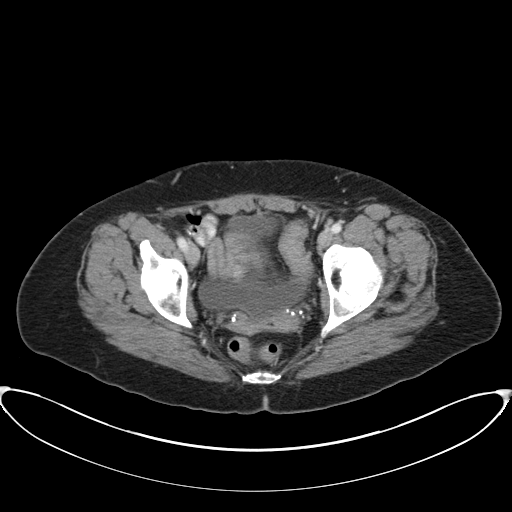
[im 31/92  soft-tissue]
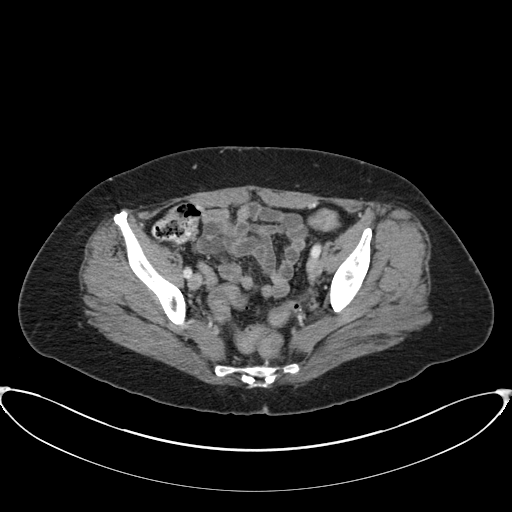
[im 37/92  soft-tissue]
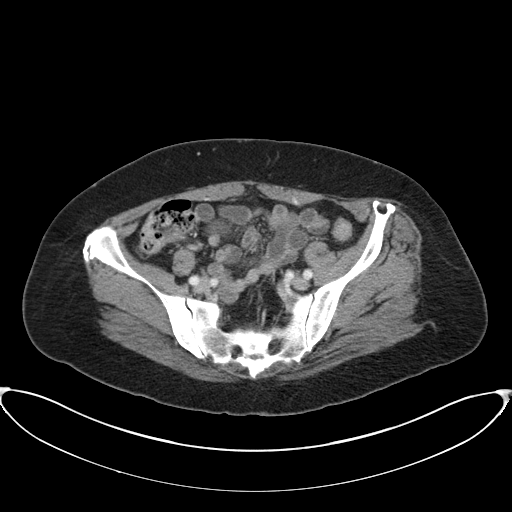
[im 49/92  soft-tissue]
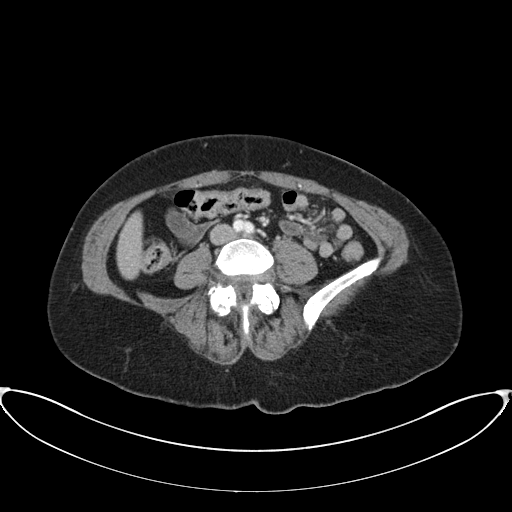
[im 55/92  soft-tissue]
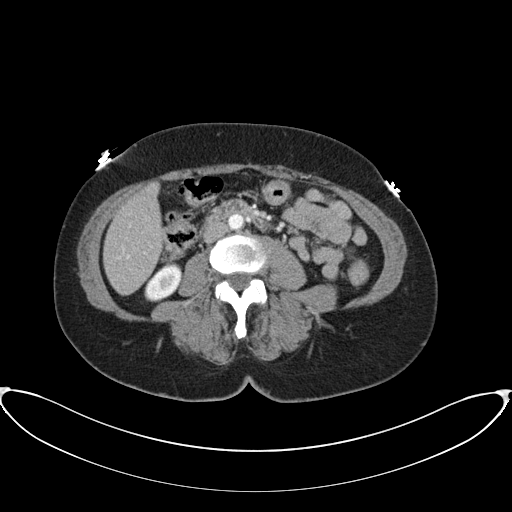
[im 61/92  soft-tissue]
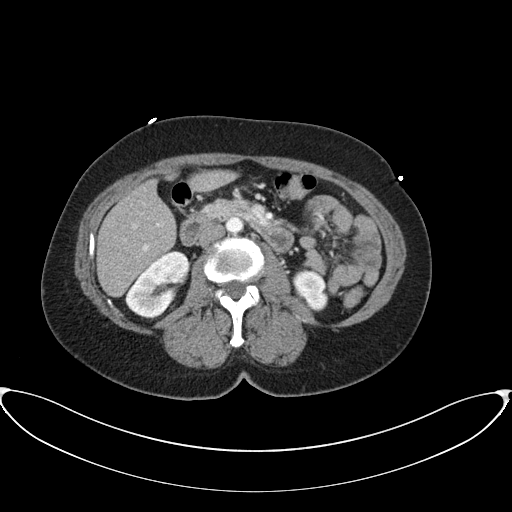
[im 61/92  bone]
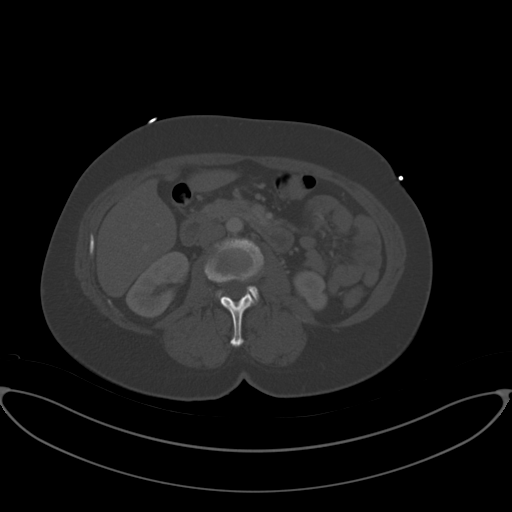
[im 67/92  soft-tissue]
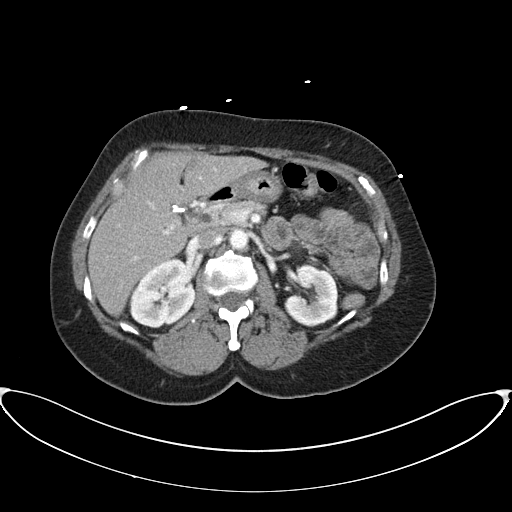
[im 73/92  soft-tissue]
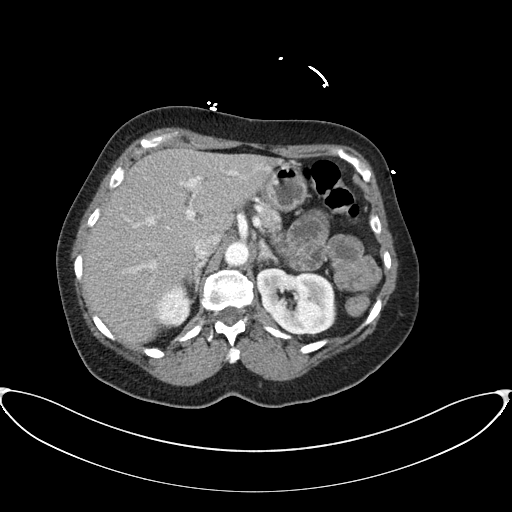
[im 79/92  soft-tissue]
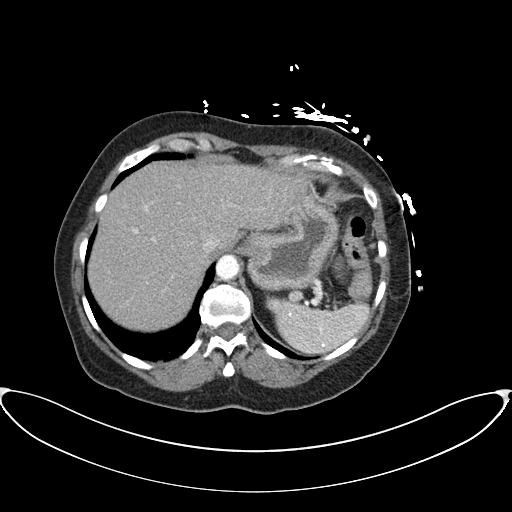
[im 85/92  soft-tissue]
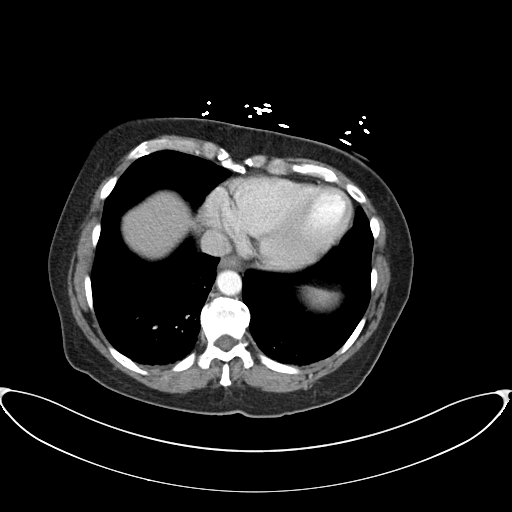

[Series 5: coronal st · coronal · 0.78mm/px · 3 of 101 slices shown]
[im 34/101  soft-tissue]
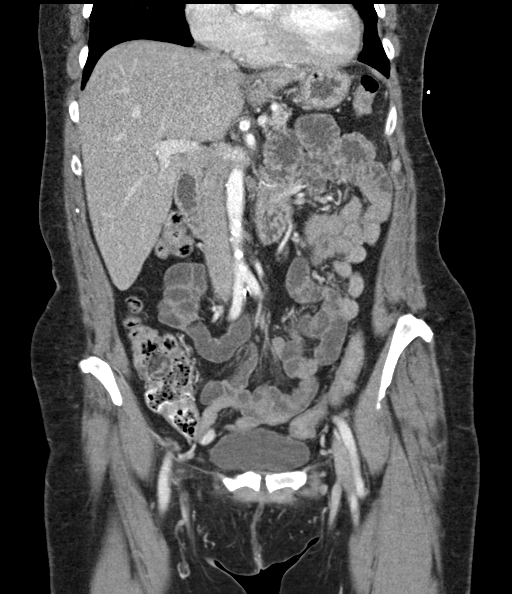
[im 45/101  soft-tissue]
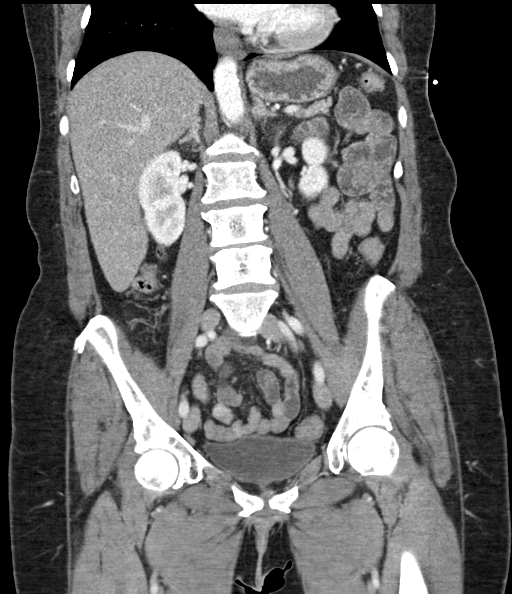
[im 56/101  soft-tissue]
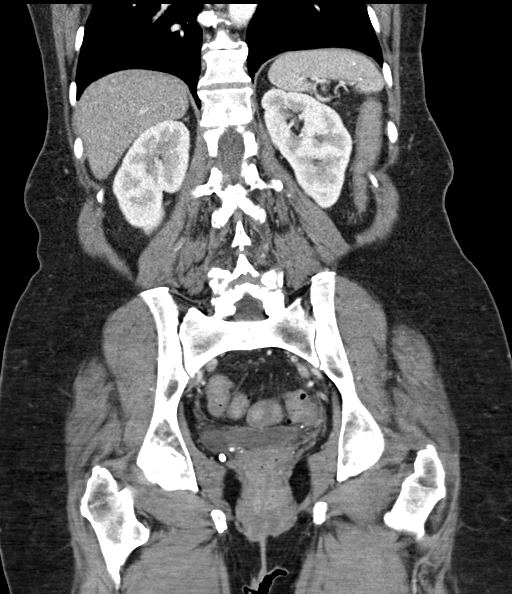

[16 of 46 positions shown; findings below may reference images not displayed]

FINDINGS: Lower chest: Lung bases are clear. Heart size is normal. There is
mild atherosclerotic calcification of coronary vessels.

Hepatobiliary: Small left hepatic cyst is stable in appearance, 1 cm
in diameter. Other smaller cyst is present in the right hepatic
lobe. No suspicious liver lesions. Status post cholecystectomy.

Pancreas: Unremarkable. No pancreatic ductal dilatation or
surrounding inflammatory changes.

Spleen: Multiple calcified granulomata are identified within the
spleen.

Adrenals/Urinary Tract: Adrenal glands are normal in appearance.
Numerous small renal cysts are present. No suspicious renal lesion.
No hydronephrosis. Urinary bladder is unremarkable.

Stomach/Bowel: The stomach and small bowel loops are normal in
appearance. Colon is normal in appearance. Status post appendectomy.

Vascular/Lymphatic: There is mild atherosclerotic calcification of
the abdominal aorta. There is normal vascular opacification of the
celiac axis, superior mesenteric artery, and inferior mesenteric
artery. Normal appearance of the portal venous system and inferior
vena cava. No retroperitoneal or mesenteric adenopathy.

Reproductive: Hysterectomy.  No adnexal mass.

Other: None

Musculoskeletal: Degenerative changes are seen in the lower lumbar
spine. No acute fracture.
IMPRESSION: 1.  No evidence for acute  abnormality.
2. No bowel obstruction.  Average stool burden.
3. Cholecystectomy.
4. Splenic granulomata.
5. Appendectomy.
6. Hysterectomy.

## 2018-02-27 DIAGNOSIS — M47815 Spondylosis without myelopathy or radiculopathy, thoracolumbar region: Secondary | ICD-10-CM | POA: Diagnosis not present

## 2018-02-27 DIAGNOSIS — Z79891 Long term (current) use of opiate analgesic: Secondary | ICD-10-CM | POA: Diagnosis not present

## 2018-02-27 DIAGNOSIS — G894 Chronic pain syndrome: Secondary | ICD-10-CM | POA: Diagnosis not present

## 2018-02-27 DIAGNOSIS — M40204 Unspecified kyphosis, thoracic region: Secondary | ICD-10-CM | POA: Diagnosis not present

## 2018-03-17 DIAGNOSIS — F3170 Bipolar disorder, currently in remission, most recent episode unspecified: Secondary | ICD-10-CM | POA: Diagnosis not present

## 2018-03-17 DIAGNOSIS — F411 Generalized anxiety disorder: Secondary | ICD-10-CM | POA: Diagnosis not present

## 2018-03-26 DIAGNOSIS — G894 Chronic pain syndrome: Secondary | ICD-10-CM | POA: Diagnosis not present

## 2018-03-26 DIAGNOSIS — Z79891 Long term (current) use of opiate analgesic: Secondary | ICD-10-CM | POA: Diagnosis not present

## 2018-03-26 DIAGNOSIS — M40204 Unspecified kyphosis, thoracic region: Secondary | ICD-10-CM | POA: Diagnosis not present

## 2018-03-26 DIAGNOSIS — M47815 Spondylosis without myelopathy or radiculopathy, thoracolumbar region: Secondary | ICD-10-CM | POA: Diagnosis not present

## 2018-04-14 DIAGNOSIS — F3170 Bipolar disorder, currently in remission, most recent episode unspecified: Secondary | ICD-10-CM | POA: Diagnosis not present

## 2018-04-14 DIAGNOSIS — F411 Generalized anxiety disorder: Secondary | ICD-10-CM | POA: Diagnosis not present

## 2018-04-23 DIAGNOSIS — M47815 Spondylosis without myelopathy or radiculopathy, thoracolumbar region: Secondary | ICD-10-CM | POA: Diagnosis not present

## 2018-04-23 DIAGNOSIS — G894 Chronic pain syndrome: Secondary | ICD-10-CM | POA: Diagnosis not present

## 2018-04-23 DIAGNOSIS — Z79891 Long term (current) use of opiate analgesic: Secondary | ICD-10-CM | POA: Diagnosis not present

## 2018-04-23 DIAGNOSIS — M40204 Unspecified kyphosis, thoracic region: Secondary | ICD-10-CM | POA: Diagnosis not present

## 2018-05-20 DIAGNOSIS — G894 Chronic pain syndrome: Secondary | ICD-10-CM | POA: Diagnosis not present

## 2018-05-20 DIAGNOSIS — M47815 Spondylosis without myelopathy or radiculopathy, thoracolumbar region: Secondary | ICD-10-CM | POA: Diagnosis not present

## 2018-05-20 DIAGNOSIS — M40204 Unspecified kyphosis, thoracic region: Secondary | ICD-10-CM | POA: Diagnosis not present

## 2018-05-20 DIAGNOSIS — Z79891 Long term (current) use of opiate analgesic: Secondary | ICD-10-CM | POA: Diagnosis not present

## 2018-06-18 DIAGNOSIS — M40204 Unspecified kyphosis, thoracic region: Secondary | ICD-10-CM | POA: Diagnosis not present

## 2018-06-18 DIAGNOSIS — G894 Chronic pain syndrome: Secondary | ICD-10-CM | POA: Diagnosis not present

## 2018-06-18 DIAGNOSIS — M47815 Spondylosis without myelopathy or radiculopathy, thoracolumbar region: Secondary | ICD-10-CM | POA: Diagnosis not present

## 2018-06-18 DIAGNOSIS — Z79891 Long term (current) use of opiate analgesic: Secondary | ICD-10-CM | POA: Diagnosis not present

## 2018-06-26 DIAGNOSIS — F3170 Bipolar disorder, currently in remission, most recent episode unspecified: Secondary | ICD-10-CM | POA: Diagnosis not present

## 2018-06-26 DIAGNOSIS — F411 Generalized anxiety disorder: Secondary | ICD-10-CM | POA: Diagnosis not present

## 2018-07-16 DIAGNOSIS — M40204 Unspecified kyphosis, thoracic region: Secondary | ICD-10-CM | POA: Diagnosis not present

## 2018-07-16 DIAGNOSIS — Z79891 Long term (current) use of opiate analgesic: Secondary | ICD-10-CM | POA: Diagnosis not present

## 2018-07-16 DIAGNOSIS — G894 Chronic pain syndrome: Secondary | ICD-10-CM | POA: Diagnosis not present

## 2018-07-16 DIAGNOSIS — M47815 Spondylosis without myelopathy or radiculopathy, thoracolumbar region: Secondary | ICD-10-CM | POA: Diagnosis not present

## 2018-08-12 DIAGNOSIS — G894 Chronic pain syndrome: Secondary | ICD-10-CM | POA: Diagnosis not present

## 2018-08-12 DIAGNOSIS — M40204 Unspecified kyphosis, thoracic region: Secondary | ICD-10-CM | POA: Diagnosis not present

## 2018-08-12 DIAGNOSIS — M47815 Spondylosis without myelopathy or radiculopathy, thoracolumbar region: Secondary | ICD-10-CM | POA: Diagnosis not present

## 2018-08-12 DIAGNOSIS — Z79891 Long term (current) use of opiate analgesic: Secondary | ICD-10-CM | POA: Diagnosis not present

## 2018-09-08 DIAGNOSIS — F3170 Bipolar disorder, currently in remission, most recent episode unspecified: Secondary | ICD-10-CM | POA: Diagnosis not present

## 2018-09-19 DIAGNOSIS — M47815 Spondylosis without myelopathy or radiculopathy, thoracolumbar region: Secondary | ICD-10-CM | POA: Diagnosis not present

## 2018-09-19 DIAGNOSIS — G894 Chronic pain syndrome: Secondary | ICD-10-CM | POA: Diagnosis not present

## 2018-09-19 DIAGNOSIS — M40204 Unspecified kyphosis, thoracic region: Secondary | ICD-10-CM | POA: Diagnosis not present

## 2018-09-19 DIAGNOSIS — Z79891 Long term (current) use of opiate analgesic: Secondary | ICD-10-CM | POA: Diagnosis not present

## 2018-11-17 DIAGNOSIS — Z79891 Long term (current) use of opiate analgesic: Secondary | ICD-10-CM | POA: Diagnosis not present

## 2018-11-17 DIAGNOSIS — M47815 Spondylosis without myelopathy or radiculopathy, thoracolumbar region: Secondary | ICD-10-CM | POA: Diagnosis not present

## 2018-11-17 DIAGNOSIS — M40204 Unspecified kyphosis, thoracic region: Secondary | ICD-10-CM | POA: Diagnosis not present

## 2018-11-17 DIAGNOSIS — G894 Chronic pain syndrome: Secondary | ICD-10-CM | POA: Diagnosis not present

## 2018-12-16 DIAGNOSIS — M40204 Unspecified kyphosis, thoracic region: Secondary | ICD-10-CM | POA: Diagnosis not present

## 2018-12-16 DIAGNOSIS — M47815 Spondylosis without myelopathy or radiculopathy, thoracolumbar region: Secondary | ICD-10-CM | POA: Diagnosis not present

## 2018-12-16 DIAGNOSIS — Z79891 Long term (current) use of opiate analgesic: Secondary | ICD-10-CM | POA: Diagnosis not present

## 2018-12-16 DIAGNOSIS — G894 Chronic pain syndrome: Secondary | ICD-10-CM | POA: Diagnosis not present

## 2019-01-12 DIAGNOSIS — F411 Generalized anxiety disorder: Secondary | ICD-10-CM | POA: Diagnosis not present

## 2019-01-12 DIAGNOSIS — F3170 Bipolar disorder, currently in remission, most recent episode unspecified: Secondary | ICD-10-CM | POA: Diagnosis not present

## 2019-01-15 DIAGNOSIS — M47815 Spondylosis without myelopathy or radiculopathy, thoracolumbar region: Secondary | ICD-10-CM | POA: Diagnosis not present

## 2019-01-15 DIAGNOSIS — M40204 Unspecified kyphosis, thoracic region: Secondary | ICD-10-CM | POA: Diagnosis not present

## 2019-01-15 DIAGNOSIS — Z79891 Long term (current) use of opiate analgesic: Secondary | ICD-10-CM | POA: Diagnosis not present

## 2019-01-15 DIAGNOSIS — G894 Chronic pain syndrome: Secondary | ICD-10-CM | POA: Diagnosis not present

## 2019-01-20 DIAGNOSIS — R946 Abnormal results of thyroid function studies: Secondary | ICD-10-CM | POA: Diagnosis not present

## 2019-01-20 DIAGNOSIS — E039 Hypothyroidism, unspecified: Secondary | ICD-10-CM | POA: Diagnosis not present

## 2019-01-20 DIAGNOSIS — F322 Major depressive disorder, single episode, severe without psychotic features: Secondary | ICD-10-CM | POA: Diagnosis not present

## 2019-01-20 DIAGNOSIS — R911 Solitary pulmonary nodule: Secondary | ICD-10-CM | POA: Diagnosis not present

## 2019-01-20 DIAGNOSIS — Z1389 Encounter for screening for other disorder: Secondary | ICD-10-CM | POA: Diagnosis not present

## 2019-01-20 DIAGNOSIS — Z8249 Family history of ischemic heart disease and other diseases of the circulatory system: Secondary | ICD-10-CM | POA: Diagnosis not present

## 2019-01-20 DIAGNOSIS — K581 Irritable bowel syndrome with constipation: Secondary | ICD-10-CM | POA: Diagnosis not present

## 2019-01-20 DIAGNOSIS — I10 Essential (primary) hypertension: Secondary | ICD-10-CM | POA: Diagnosis not present

## 2019-01-20 DIAGNOSIS — R002 Palpitations: Secondary | ICD-10-CM | POA: Diagnosis not present

## 2019-01-20 DIAGNOSIS — F411 Generalized anxiety disorder: Secondary | ICD-10-CM | POA: Diagnosis not present

## 2019-01-20 DIAGNOSIS — Z23 Encounter for immunization: Secondary | ICD-10-CM | POA: Diagnosis not present

## 2019-01-20 DIAGNOSIS — B001 Herpesviral vesicular dermatitis: Secondary | ICD-10-CM | POA: Diagnosis not present

## 2019-01-20 DIAGNOSIS — Z0000 Encounter for general adult medical examination without abnormal findings: Secondary | ICD-10-CM | POA: Diagnosis not present

## 2019-01-20 DIAGNOSIS — M797 Fibromyalgia: Secondary | ICD-10-CM | POA: Diagnosis not present

## 2019-02-11 DIAGNOSIS — M40204 Unspecified kyphosis, thoracic region: Secondary | ICD-10-CM | POA: Diagnosis not present

## 2019-02-11 DIAGNOSIS — G894 Chronic pain syndrome: Secondary | ICD-10-CM | POA: Diagnosis not present

## 2019-02-11 DIAGNOSIS — Z79891 Long term (current) use of opiate analgesic: Secondary | ICD-10-CM | POA: Diagnosis not present

## 2019-02-11 DIAGNOSIS — M47815 Spondylosis without myelopathy or radiculopathy, thoracolumbar region: Secondary | ICD-10-CM | POA: Diagnosis not present

## 2019-03-03 DIAGNOSIS — M5416 Radiculopathy, lumbar region: Secondary | ICD-10-CM | POA: Diagnosis not present

## 2019-03-03 DIAGNOSIS — M549 Dorsalgia, unspecified: Secondary | ICD-10-CM | POA: Diagnosis not present

## 2019-03-03 DIAGNOSIS — M545 Low back pain: Secondary | ICD-10-CM | POA: Diagnosis not present

## 2019-03-06 ENCOUNTER — Other Ambulatory Visit: Payer: Self-pay | Admitting: Rehabilitation

## 2019-03-06 DIAGNOSIS — M545 Low back pain, unspecified: Secondary | ICD-10-CM

## 2019-03-11 DIAGNOSIS — M47815 Spondylosis without myelopathy or radiculopathy, thoracolumbar region: Secondary | ICD-10-CM | POA: Diagnosis not present

## 2019-03-11 DIAGNOSIS — M40204 Unspecified kyphosis, thoracic region: Secondary | ICD-10-CM | POA: Diagnosis not present

## 2019-03-11 DIAGNOSIS — Z79891 Long term (current) use of opiate analgesic: Secondary | ICD-10-CM | POA: Diagnosis not present

## 2019-03-11 DIAGNOSIS — G894 Chronic pain syndrome: Secondary | ICD-10-CM | POA: Diagnosis not present

## 2019-03-13 ENCOUNTER — Other Ambulatory Visit: Payer: Self-pay

## 2019-04-14 DIAGNOSIS — Z79891 Long term (current) use of opiate analgesic: Secondary | ICD-10-CM | POA: Diagnosis not present

## 2019-04-14 DIAGNOSIS — M47815 Spondylosis without myelopathy or radiculopathy, thoracolumbar region: Secondary | ICD-10-CM | POA: Diagnosis not present

## 2019-04-14 DIAGNOSIS — G894 Chronic pain syndrome: Secondary | ICD-10-CM | POA: Diagnosis not present

## 2019-04-14 DIAGNOSIS — M40204 Unspecified kyphosis, thoracic region: Secondary | ICD-10-CM | POA: Diagnosis not present

## 2019-04-15 DIAGNOSIS — I10 Essential (primary) hypertension: Secondary | ICD-10-CM | POA: Diagnosis not present

## 2019-04-30 DIAGNOSIS — F3170 Bipolar disorder, currently in remission, most recent episode unspecified: Secondary | ICD-10-CM | POA: Diagnosis not present

## 2019-04-30 DIAGNOSIS — F411 Generalized anxiety disorder: Secondary | ICD-10-CM | POA: Diagnosis not present

## 2019-05-04 ENCOUNTER — Other Ambulatory Visit: Payer: Self-pay

## 2019-05-04 ENCOUNTER — Ambulatory Visit
Admission: RE | Admit: 2019-05-04 | Discharge: 2019-05-04 | Disposition: A | Discharge status: Home / Self Care | Payer: Medicare Other | Source: Ambulatory Visit | Attending: Rehabilitation | Admitting: Rehabilitation

## 2019-05-04 DIAGNOSIS — M545 Low back pain, unspecified: Secondary | ICD-10-CM

## 2019-05-04 DIAGNOSIS — M48061 Spinal stenosis, lumbar region without neurogenic claudication: Secondary | ICD-10-CM | POA: Diagnosis not present

## 2019-05-07 DIAGNOSIS — M47815 Spondylosis without myelopathy or radiculopathy, thoracolumbar region: Secondary | ICD-10-CM | POA: Diagnosis not present

## 2019-05-07 DIAGNOSIS — M4316 Spondylolisthesis, lumbar region: Secondary | ICD-10-CM | POA: Diagnosis not present

## 2019-05-07 DIAGNOSIS — Z79891 Long term (current) use of opiate analgesic: Secondary | ICD-10-CM | POA: Diagnosis not present

## 2019-05-07 DIAGNOSIS — G894 Chronic pain syndrome: Secondary | ICD-10-CM | POA: Diagnosis not present

## 2019-05-15 DIAGNOSIS — M545 Low back pain: Secondary | ICD-10-CM | POA: Diagnosis not present

## 2019-05-15 DIAGNOSIS — M4726 Other spondylosis with radiculopathy, lumbar region: Secondary | ICD-10-CM | POA: Diagnosis not present

## 2019-05-15 DIAGNOSIS — M7918 Myalgia, other site: Secondary | ICD-10-CM | POA: Diagnosis not present

## 2019-05-20 DIAGNOSIS — M48061 Spinal stenosis, lumbar region without neurogenic claudication: Secondary | ICD-10-CM | POA: Diagnosis not present

## 2019-05-20 DIAGNOSIS — I10 Essential (primary) hypertension: Secondary | ICD-10-CM | POA: Diagnosis not present

## 2019-06-05 DIAGNOSIS — G894 Chronic pain syndrome: Secondary | ICD-10-CM | POA: Diagnosis not present

## 2019-06-05 DIAGNOSIS — Z79899 Other long term (current) drug therapy: Secondary | ICD-10-CM | POA: Diagnosis not present

## 2019-06-05 DIAGNOSIS — M7918 Myalgia, other site: Secondary | ICD-10-CM | POA: Diagnosis not present

## 2019-06-05 DIAGNOSIS — M4726 Other spondylosis with radiculopathy, lumbar region: Secondary | ICD-10-CM | POA: Diagnosis not present

## 2019-06-05 DIAGNOSIS — M545 Low back pain: Secondary | ICD-10-CM | POA: Diagnosis not present

## 2019-06-05 DIAGNOSIS — Z79891 Long term (current) use of opiate analgesic: Secondary | ICD-10-CM | POA: Diagnosis not present

## 2019-07-08 DIAGNOSIS — M545 Low back pain: Secondary | ICD-10-CM | POA: Diagnosis not present

## 2019-07-08 DIAGNOSIS — G894 Chronic pain syndrome: Secondary | ICD-10-CM | POA: Diagnosis not present

## 2019-07-08 DIAGNOSIS — M7918 Myalgia, other site: Secondary | ICD-10-CM | POA: Diagnosis not present

## 2019-07-08 DIAGNOSIS — M7552 Bursitis of left shoulder: Secondary | ICD-10-CM | POA: Diagnosis not present

## 2019-07-08 DIAGNOSIS — M4316 Spondylolisthesis, lumbar region: Secondary | ICD-10-CM | POA: Diagnosis not present

## 2019-08-10 DIAGNOSIS — I10 Essential (primary) hypertension: Secondary | ICD-10-CM | POA: Diagnosis not present

## 2019-08-10 DIAGNOSIS — G894 Chronic pain syndrome: Secondary | ICD-10-CM | POA: Diagnosis not present

## 2019-08-10 DIAGNOSIS — M7552 Bursitis of left shoulder: Secondary | ICD-10-CM | POA: Diagnosis not present

## 2019-08-10 DIAGNOSIS — Z6826 Body mass index (BMI) 26.0-26.9, adult: Secondary | ICD-10-CM | POA: Diagnosis not present

## 2019-08-10 DIAGNOSIS — M4316 Spondylolisthesis, lumbar region: Secondary | ICD-10-CM | POA: Diagnosis not present

## 2019-09-08 DIAGNOSIS — G894 Chronic pain syndrome: Secondary | ICD-10-CM | POA: Diagnosis not present

## 2019-09-08 DIAGNOSIS — M4316 Spondylolisthesis, lumbar region: Secondary | ICD-10-CM | POA: Diagnosis not present

## 2019-09-08 DIAGNOSIS — M7552 Bursitis of left shoulder: Secondary | ICD-10-CM | POA: Diagnosis not present

## 2019-09-08 DIAGNOSIS — Z6826 Body mass index (BMI) 26.0-26.9, adult: Secondary | ICD-10-CM | POA: Diagnosis not present

## 2019-09-17 DIAGNOSIS — F3170 Bipolar disorder, currently in remission, most recent episode unspecified: Secondary | ICD-10-CM | POA: Diagnosis not present

## 2019-10-01 DIAGNOSIS — E039 Hypothyroidism, unspecified: Secondary | ICD-10-CM | POA: Diagnosis not present

## 2019-10-01 DIAGNOSIS — F322 Major depressive disorder, single episode, severe without psychotic features: Secondary | ICD-10-CM | POA: Diagnosis not present

## 2019-10-01 DIAGNOSIS — J40 Bronchitis, not specified as acute or chronic: Secondary | ICD-10-CM | POA: Diagnosis not present

## 2019-10-01 DIAGNOSIS — M47812 Spondylosis without myelopathy or radiculopathy, cervical region: Secondary | ICD-10-CM | POA: Diagnosis not present

## 2019-10-01 DIAGNOSIS — I10 Essential (primary) hypertension: Secondary | ICD-10-CM | POA: Diagnosis not present

## 2019-10-06 DIAGNOSIS — M4316 Spondylolisthesis, lumbar region: Secondary | ICD-10-CM | POA: Diagnosis not present

## 2019-10-06 DIAGNOSIS — M7552 Bursitis of left shoulder: Secondary | ICD-10-CM | POA: Diagnosis not present

## 2019-10-06 DIAGNOSIS — G894 Chronic pain syndrome: Secondary | ICD-10-CM | POA: Diagnosis not present

## 2019-10-28 DIAGNOSIS — F3170 Bipolar disorder, currently in remission, most recent episode unspecified: Secondary | ICD-10-CM | POA: Diagnosis not present

## 2019-11-03 DIAGNOSIS — M7552 Bursitis of left shoulder: Secondary | ICD-10-CM | POA: Diagnosis not present

## 2019-11-03 DIAGNOSIS — M4316 Spondylolisthesis, lumbar region: Secondary | ICD-10-CM | POA: Diagnosis not present

## 2019-11-03 DIAGNOSIS — G894 Chronic pain syndrome: Secondary | ICD-10-CM | POA: Diagnosis not present

## 2019-11-04 DIAGNOSIS — I10 Essential (primary) hypertension: Secondary | ICD-10-CM | POA: Diagnosis not present

## 2019-11-04 DIAGNOSIS — E039 Hypothyroidism, unspecified: Secondary | ICD-10-CM | POA: Diagnosis not present

## 2019-11-04 DIAGNOSIS — M47812 Spondylosis without myelopathy or radiculopathy, cervical region: Secondary | ICD-10-CM | POA: Diagnosis not present

## 2019-11-04 DIAGNOSIS — J40 Bronchitis, not specified as acute or chronic: Secondary | ICD-10-CM | POA: Diagnosis not present

## 2019-11-04 DIAGNOSIS — F322 Major depressive disorder, single episode, severe without psychotic features: Secondary | ICD-10-CM | POA: Diagnosis not present

## 2019-12-02 DIAGNOSIS — G894 Chronic pain syndrome: Secondary | ICD-10-CM | POA: Diagnosis not present

## 2019-12-02 DIAGNOSIS — Z6826 Body mass index (BMI) 26.0-26.9, adult: Secondary | ICD-10-CM | POA: Diagnosis not present

## 2019-12-02 DIAGNOSIS — M7552 Bursitis of left shoulder: Secondary | ICD-10-CM | POA: Diagnosis not present

## 2019-12-02 DIAGNOSIS — M4316 Spondylolisthesis, lumbar region: Secondary | ICD-10-CM | POA: Diagnosis not present

## 2019-12-17 DIAGNOSIS — F322 Major depressive disorder, single episode, severe without psychotic features: Secondary | ICD-10-CM | POA: Diagnosis not present

## 2019-12-17 DIAGNOSIS — M47812 Spondylosis without myelopathy or radiculopathy, cervical region: Secondary | ICD-10-CM | POA: Diagnosis not present

## 2019-12-17 DIAGNOSIS — J40 Bronchitis, not specified as acute or chronic: Secondary | ICD-10-CM | POA: Diagnosis not present

## 2019-12-17 DIAGNOSIS — I10 Essential (primary) hypertension: Secondary | ICD-10-CM | POA: Diagnosis not present

## 2019-12-17 DIAGNOSIS — E039 Hypothyroidism, unspecified: Secondary | ICD-10-CM | POA: Diagnosis not present

## 2019-12-25 DIAGNOSIS — G894 Chronic pain syndrome: Secondary | ICD-10-CM | POA: Diagnosis not present

## 2019-12-25 DIAGNOSIS — M4316 Spondylolisthesis, lumbar region: Secondary | ICD-10-CM | POA: Diagnosis not present

## 2019-12-25 DIAGNOSIS — M7552 Bursitis of left shoulder: Secondary | ICD-10-CM | POA: Diagnosis not present

## 2019-12-29 DIAGNOSIS — J40 Bronchitis, not specified as acute or chronic: Secondary | ICD-10-CM | POA: Diagnosis not present

## 2019-12-29 DIAGNOSIS — M47812 Spondylosis without myelopathy or radiculopathy, cervical region: Secondary | ICD-10-CM | POA: Diagnosis not present

## 2019-12-29 DIAGNOSIS — E039 Hypothyroidism, unspecified: Secondary | ICD-10-CM | POA: Diagnosis not present

## 2019-12-29 DIAGNOSIS — F322 Major depressive disorder, single episode, severe without psychotic features: Secondary | ICD-10-CM | POA: Diagnosis not present

## 2019-12-29 DIAGNOSIS — I10 Essential (primary) hypertension: Secondary | ICD-10-CM | POA: Diagnosis not present

## 2019-12-30 DIAGNOSIS — F3170 Bipolar disorder, currently in remission, most recent episode unspecified: Secondary | ICD-10-CM | POA: Diagnosis not present

## 2020-02-01 DIAGNOSIS — E039 Hypothyroidism, unspecified: Secondary | ICD-10-CM | POA: Diagnosis not present

## 2020-02-01 DIAGNOSIS — F322 Major depressive disorder, single episode, severe without psychotic features: Secondary | ICD-10-CM | POA: Diagnosis not present

## 2020-02-01 DIAGNOSIS — J40 Bronchitis, not specified as acute or chronic: Secondary | ICD-10-CM | POA: Diagnosis not present

## 2020-02-01 DIAGNOSIS — I10 Essential (primary) hypertension: Secondary | ICD-10-CM | POA: Diagnosis not present

## 2020-02-01 DIAGNOSIS — M47812 Spondylosis without myelopathy or radiculopathy, cervical region: Secondary | ICD-10-CM | POA: Diagnosis not present

## 2020-02-02 DIAGNOSIS — G894 Chronic pain syndrome: Secondary | ICD-10-CM | POA: Diagnosis not present

## 2020-02-02 DIAGNOSIS — M4316 Spondylolisthesis, lumbar region: Secondary | ICD-10-CM | POA: Diagnosis not present

## 2020-02-02 DIAGNOSIS — M7552 Bursitis of left shoulder: Secondary | ICD-10-CM | POA: Diagnosis not present

## 2020-02-09 DIAGNOSIS — M47816 Spondylosis without myelopathy or radiculopathy, lumbar region: Secondary | ICD-10-CM | POA: Diagnosis not present

## 2020-02-29 DIAGNOSIS — M5136 Other intervertebral disc degeneration, lumbar region: Secondary | ICD-10-CM | POA: Diagnosis not present

## 2020-03-03 DIAGNOSIS — M4316 Spondylolisthesis, lumbar region: Secondary | ICD-10-CM | POA: Diagnosis not present

## 2020-03-03 DIAGNOSIS — M5136 Other intervertebral disc degeneration, lumbar region: Secondary | ICD-10-CM | POA: Diagnosis not present

## 2020-03-07 DIAGNOSIS — J40 Bronchitis, not specified as acute or chronic: Secondary | ICD-10-CM | POA: Diagnosis not present

## 2020-03-07 DIAGNOSIS — M47812 Spondylosis without myelopathy or radiculopathy, cervical region: Secondary | ICD-10-CM | POA: Diagnosis not present

## 2020-03-07 DIAGNOSIS — F322 Major depressive disorder, single episode, severe without psychotic features: Secondary | ICD-10-CM | POA: Diagnosis not present

## 2020-03-07 DIAGNOSIS — E039 Hypothyroidism, unspecified: Secondary | ICD-10-CM | POA: Diagnosis not present

## 2020-03-07 DIAGNOSIS — I10 Essential (primary) hypertension: Secondary | ICD-10-CM | POA: Diagnosis not present

## 2020-03-25 DIAGNOSIS — Z23 Encounter for immunization: Secondary | ICD-10-CM | POA: Diagnosis not present

## 2020-04-04 DIAGNOSIS — Z79891 Long term (current) use of opiate analgesic: Secondary | ICD-10-CM | POA: Diagnosis not present

## 2020-04-04 DIAGNOSIS — Z681 Body mass index (BMI) 19 or less, adult: Secondary | ICD-10-CM | POA: Diagnosis not present

## 2020-04-04 DIAGNOSIS — Z79899 Other long term (current) drug therapy: Secondary | ICD-10-CM | POA: Diagnosis not present

## 2020-04-04 DIAGNOSIS — M4316 Spondylolisthesis, lumbar region: Secondary | ICD-10-CM | POA: Diagnosis not present

## 2020-04-04 DIAGNOSIS — G894 Chronic pain syndrome: Secondary | ICD-10-CM | POA: Diagnosis not present

## 2020-04-04 DIAGNOSIS — I10 Essential (primary) hypertension: Secondary | ICD-10-CM | POA: Diagnosis not present

## 2020-04-04 DIAGNOSIS — M47816 Spondylosis without myelopathy or radiculopathy, lumbar region: Secondary | ICD-10-CM | POA: Diagnosis not present

## 2020-04-15 DIAGNOSIS — J40 Bronchitis, not specified as acute or chronic: Secondary | ICD-10-CM | POA: Diagnosis not present

## 2020-04-15 DIAGNOSIS — M47812 Spondylosis without myelopathy or radiculopathy, cervical region: Secondary | ICD-10-CM | POA: Diagnosis not present

## 2020-04-15 DIAGNOSIS — I10 Essential (primary) hypertension: Secondary | ICD-10-CM | POA: Diagnosis not present

## 2020-04-15 DIAGNOSIS — E039 Hypothyroidism, unspecified: Secondary | ICD-10-CM | POA: Diagnosis not present

## 2020-04-15 DIAGNOSIS — F322 Major depressive disorder, single episode, severe without psychotic features: Secondary | ICD-10-CM | POA: Diagnosis not present

## 2020-05-06 DIAGNOSIS — M25561 Pain in right knee: Secondary | ICD-10-CM | POA: Diagnosis not present

## 2020-05-06 DIAGNOSIS — M4726 Other spondylosis with radiculopathy, lumbar region: Secondary | ICD-10-CM | POA: Diagnosis not present

## 2020-05-06 DIAGNOSIS — M48062 Spinal stenosis, lumbar region with neurogenic claudication: Secondary | ICD-10-CM | POA: Diagnosis not present

## 2020-05-06 DIAGNOSIS — M5431 Sciatica, right side: Secondary | ICD-10-CM | POA: Diagnosis not present

## 2020-05-06 DIAGNOSIS — M4316 Spondylolisthesis, lumbar region: Secondary | ICD-10-CM | POA: Diagnosis not present

## 2020-05-06 DIAGNOSIS — M25562 Pain in left knee: Secondary | ICD-10-CM | POA: Diagnosis not present

## 2020-05-10 ENCOUNTER — Other Ambulatory Visit: Payer: Self-pay | Admitting: Orthopedic Surgery

## 2020-05-10 DIAGNOSIS — M48062 Spinal stenosis, lumbar region with neurogenic claudication: Secondary | ICD-10-CM

## 2020-05-22 ENCOUNTER — Ambulatory Visit
Admission: RE | Admit: 2020-05-22 | Discharge: 2020-05-22 | Disposition: A | Discharge status: Home / Self Care | Payer: Medicare Other | Source: Ambulatory Visit | Attending: Orthopedic Surgery | Admitting: Orthopedic Surgery

## 2020-05-22 DIAGNOSIS — M48061 Spinal stenosis, lumbar region without neurogenic claudication: Secondary | ICD-10-CM | POA: Diagnosis not present

## 2020-05-22 DIAGNOSIS — M48062 Spinal stenosis, lumbar region with neurogenic claudication: Secondary | ICD-10-CM

## 2020-06-03 DIAGNOSIS — M5431 Sciatica, right side: Secondary | ICD-10-CM | POA: Diagnosis not present

## 2020-06-03 DIAGNOSIS — M48062 Spinal stenosis, lumbar region with neurogenic claudication: Secondary | ICD-10-CM | POA: Diagnosis not present

## 2020-06-03 DIAGNOSIS — M4316 Spondylolisthesis, lumbar region: Secondary | ICD-10-CM | POA: Diagnosis not present

## 2020-06-03 DIAGNOSIS — M4726 Other spondylosis with radiculopathy, lumbar region: Secondary | ICD-10-CM | POA: Diagnosis not present

## 2020-06-16 DIAGNOSIS — M5416 Radiculopathy, lumbar region: Secondary | ICD-10-CM | POA: Diagnosis not present

## 2020-06-16 DIAGNOSIS — E039 Hypothyroidism, unspecified: Secondary | ICD-10-CM | POA: Diagnosis not present

## 2020-06-16 DIAGNOSIS — F322 Major depressive disorder, single episode, severe without psychotic features: Secondary | ICD-10-CM | POA: Diagnosis not present

## 2020-06-16 DIAGNOSIS — I10 Essential (primary) hypertension: Secondary | ICD-10-CM | POA: Diagnosis not present

## 2020-07-11 DIAGNOSIS — F411 Generalized anxiety disorder: Secondary | ICD-10-CM | POA: Diagnosis not present

## 2020-07-11 DIAGNOSIS — F319 Bipolar disorder, unspecified: Secondary | ICD-10-CM | POA: Diagnosis not present

## 2020-07-15 DIAGNOSIS — M4316 Spondylolisthesis, lumbar region: Secondary | ICD-10-CM | POA: Diagnosis not present

## 2020-07-15 DIAGNOSIS — Z4689 Encounter for fitting and adjustment of other specified devices: Secondary | ICD-10-CM | POA: Diagnosis not present

## 2020-07-15 DIAGNOSIS — M5431 Sciatica, right side: Secondary | ICD-10-CM | POA: Diagnosis not present

## 2020-07-15 DIAGNOSIS — M4726 Other spondylosis with radiculopathy, lumbar region: Secondary | ICD-10-CM | POA: Diagnosis not present

## 2020-07-15 DIAGNOSIS — R001 Bradycardia, unspecified: Secondary | ICD-10-CM | POA: Diagnosis not present

## 2020-07-15 DIAGNOSIS — M48062 Spinal stenosis, lumbar region with neurogenic claudication: Secondary | ICD-10-CM | POA: Diagnosis not present

## 2020-07-15 DIAGNOSIS — I517 Cardiomegaly: Secondary | ICD-10-CM | POA: Diagnosis not present

## 2020-07-15 DIAGNOSIS — Z01810 Encounter for preprocedural cardiovascular examination: Secondary | ICD-10-CM | POA: Diagnosis not present

## 2020-07-25 DIAGNOSIS — M47816 Spondylosis without myelopathy or radiculopathy, lumbar region: Secondary | ICD-10-CM | POA: Diagnosis not present

## 2020-07-25 DIAGNOSIS — M5431 Sciatica, right side: Secondary | ICD-10-CM | POA: Diagnosis not present

## 2020-07-25 DIAGNOSIS — M545 Low back pain: Secondary | ICD-10-CM | POA: Diagnosis not present

## 2020-07-25 DIAGNOSIS — M5432 Sciatica, left side: Secondary | ICD-10-CM | POA: Diagnosis not present

## 2020-07-25 DIAGNOSIS — M48062 Spinal stenosis, lumbar region with neurogenic claudication: Secondary | ICD-10-CM | POA: Diagnosis not present

## 2020-07-25 DIAGNOSIS — F329 Major depressive disorder, single episode, unspecified: Secondary | ICD-10-CM | POA: Diagnosis not present

## 2020-07-25 DIAGNOSIS — F419 Anxiety disorder, unspecified: Secondary | ICD-10-CM | POA: Diagnosis not present

## 2020-07-25 DIAGNOSIS — M4316 Spondylolisthesis, lumbar region: Secondary | ICD-10-CM | POA: Diagnosis not present

## 2020-07-25 DIAGNOSIS — I10 Essential (primary) hypertension: Secondary | ICD-10-CM | POA: Diagnosis not present

## 2020-07-25 DIAGNOSIS — M4326 Fusion of spine, lumbar region: Secondary | ICD-10-CM | POA: Diagnosis not present

## 2020-07-25 DIAGNOSIS — Z981 Arthrodesis status: Secondary | ICD-10-CM | POA: Diagnosis not present

## 2020-07-25 DIAGNOSIS — M4726 Other spondylosis with radiculopathy, lumbar region: Secondary | ICD-10-CM | POA: Diagnosis not present

## 2020-07-26 DIAGNOSIS — M4316 Spondylolisthesis, lumbar region: Secondary | ICD-10-CM | POA: Diagnosis not present

## 2020-07-26 DIAGNOSIS — M4726 Other spondylosis with radiculopathy, lumbar region: Secondary | ICD-10-CM | POA: Diagnosis not present

## 2020-07-26 DIAGNOSIS — M5431 Sciatica, right side: Secondary | ICD-10-CM | POA: Diagnosis not present

## 2020-07-26 DIAGNOSIS — M48061 Spinal stenosis, lumbar region without neurogenic claudication: Secondary | ICD-10-CM | POA: Diagnosis not present

## 2020-07-26 DIAGNOSIS — M48062 Spinal stenosis, lumbar region with neurogenic claudication: Secondary | ICD-10-CM | POA: Diagnosis not present

## 2020-07-26 DIAGNOSIS — M5432 Sciatica, left side: Secondary | ICD-10-CM | POA: Diagnosis not present

## 2020-07-26 DIAGNOSIS — Z981 Arthrodesis status: Secondary | ICD-10-CM | POA: Diagnosis not present

## 2020-07-26 DIAGNOSIS — Z9889 Other specified postprocedural states: Secondary | ICD-10-CM | POA: Diagnosis not present

## 2020-07-26 DIAGNOSIS — F419 Anxiety disorder, unspecified: Secondary | ICD-10-CM | POA: Diagnosis not present

## 2020-07-27 DIAGNOSIS — M4726 Other spondylosis with radiculopathy, lumbar region: Secondary | ICD-10-CM | POA: Diagnosis not present

## 2020-07-27 DIAGNOSIS — M4316 Spondylolisthesis, lumbar region: Secondary | ICD-10-CM | POA: Diagnosis not present

## 2020-07-27 DIAGNOSIS — M48062 Spinal stenosis, lumbar region with neurogenic claudication: Secondary | ICD-10-CM | POA: Diagnosis not present

## 2020-07-27 DIAGNOSIS — M5432 Sciatica, left side: Secondary | ICD-10-CM | POA: Diagnosis not present

## 2020-07-27 DIAGNOSIS — F419 Anxiety disorder, unspecified: Secondary | ICD-10-CM | POA: Diagnosis not present

## 2020-07-27 DIAGNOSIS — M5431 Sciatica, right side: Secondary | ICD-10-CM | POA: Diagnosis not present

## 2020-08-01 DIAGNOSIS — E039 Hypothyroidism, unspecified: Secondary | ICD-10-CM | POA: Diagnosis not present

## 2020-08-01 DIAGNOSIS — F322 Major depressive disorder, single episode, severe without psychotic features: Secondary | ICD-10-CM | POA: Diagnosis not present

## 2020-08-01 DIAGNOSIS — M47812 Spondylosis without myelopathy or radiculopathy, cervical region: Secondary | ICD-10-CM | POA: Diagnosis not present

## 2020-08-01 DIAGNOSIS — I10 Essential (primary) hypertension: Secondary | ICD-10-CM | POA: Diagnosis not present

## 2020-08-01 DIAGNOSIS — J40 Bronchitis, not specified as acute or chronic: Secondary | ICD-10-CM | POA: Diagnosis not present

## 2020-08-23 DIAGNOSIS — M4326 Fusion of spine, lumbar region: Secondary | ICD-10-CM | POA: Diagnosis not present

## 2020-09-07 DIAGNOSIS — F411 Generalized anxiety disorder: Secondary | ICD-10-CM | POA: Diagnosis not present

## 2020-09-20 DIAGNOSIS — M4326 Fusion of spine, lumbar region: Secondary | ICD-10-CM | POA: Diagnosis not present

## 2020-10-04 DIAGNOSIS — F322 Major depressive disorder, single episode, severe without psychotic features: Secondary | ICD-10-CM | POA: Diagnosis not present

## 2020-10-04 DIAGNOSIS — E039 Hypothyroidism, unspecified: Secondary | ICD-10-CM | POA: Diagnosis not present

## 2020-10-04 DIAGNOSIS — M47812 Spondylosis without myelopathy or radiculopathy, cervical region: Secondary | ICD-10-CM | POA: Diagnosis not present

## 2020-10-04 DIAGNOSIS — J40 Bronchitis, not specified as acute or chronic: Secondary | ICD-10-CM | POA: Diagnosis not present

## 2020-10-04 DIAGNOSIS — I10 Essential (primary) hypertension: Secondary | ICD-10-CM | POA: Diagnosis not present

## 2020-10-18 DIAGNOSIS — M4326 Fusion of spine, lumbar region: Secondary | ICD-10-CM | POA: Diagnosis not present

## 2020-10-28 DIAGNOSIS — F411 Generalized anxiety disorder: Secondary | ICD-10-CM | POA: Diagnosis not present

## 2020-10-28 DIAGNOSIS — M4327 Fusion of spine, lumbosacral region: Secondary | ICD-10-CM | POA: Diagnosis not present

## 2020-10-31 DIAGNOSIS — M4327 Fusion of spine, lumbosacral region: Secondary | ICD-10-CM | POA: Diagnosis not present

## 2020-11-02 DIAGNOSIS — M4327 Fusion of spine, lumbosacral region: Secondary | ICD-10-CM | POA: Diagnosis not present

## 2020-11-07 DIAGNOSIS — M4327 Fusion of spine, lumbosacral region: Secondary | ICD-10-CM | POA: Diagnosis not present

## 2020-11-09 DIAGNOSIS — M4327 Fusion of spine, lumbosacral region: Secondary | ICD-10-CM | POA: Diagnosis not present

## 2020-11-14 DIAGNOSIS — M4327 Fusion of spine, lumbosacral region: Secondary | ICD-10-CM | POA: Diagnosis not present

## 2020-11-16 DIAGNOSIS — M4327 Fusion of spine, lumbosacral region: Secondary | ICD-10-CM | POA: Diagnosis not present

## 2020-11-17 DIAGNOSIS — Z79891 Long term (current) use of opiate analgesic: Secondary | ICD-10-CM | POA: Diagnosis not present

## 2020-11-17 DIAGNOSIS — Z79899 Other long term (current) drug therapy: Secondary | ICD-10-CM | POA: Diagnosis not present

## 2020-11-17 DIAGNOSIS — G894 Chronic pain syndrome: Secondary | ICD-10-CM | POA: Diagnosis not present

## 2020-11-17 DIAGNOSIS — M4326 Fusion of spine, lumbar region: Secondary | ICD-10-CM | POA: Diagnosis not present

## 2020-11-21 DIAGNOSIS — Z23 Encounter for immunization: Secondary | ICD-10-CM | POA: Diagnosis not present

## 2020-12-01 DIAGNOSIS — M4327 Fusion of spine, lumbosacral region: Secondary | ICD-10-CM | POA: Diagnosis not present

## 2020-12-05 DIAGNOSIS — M4327 Fusion of spine, lumbosacral region: Secondary | ICD-10-CM | POA: Diagnosis not present

## 2020-12-07 DIAGNOSIS — R635 Abnormal weight gain: Secondary | ICD-10-CM | POA: Diagnosis not present

## 2020-12-07 DIAGNOSIS — R41840 Attention and concentration deficit: Secondary | ICD-10-CM | POA: Diagnosis not present

## 2020-12-07 DIAGNOSIS — F329 Major depressive disorder, single episode, unspecified: Secondary | ICD-10-CM | POA: Diagnosis not present

## 2020-12-12 DIAGNOSIS — F322 Major depressive disorder, single episode, severe without psychotic features: Secondary | ICD-10-CM | POA: Diagnosis not present

## 2020-12-12 DIAGNOSIS — E039 Hypothyroidism, unspecified: Secondary | ICD-10-CM | POA: Diagnosis not present

## 2020-12-12 DIAGNOSIS — F329 Major depressive disorder, single episode, unspecified: Secondary | ICD-10-CM | POA: Diagnosis not present

## 2020-12-12 DIAGNOSIS — J40 Bronchitis, not specified as acute or chronic: Secondary | ICD-10-CM | POA: Diagnosis not present

## 2020-12-12 DIAGNOSIS — G4700 Insomnia, unspecified: Secondary | ICD-10-CM | POA: Diagnosis not present

## 2020-12-12 DIAGNOSIS — I10 Essential (primary) hypertension: Secondary | ICD-10-CM | POA: Diagnosis not present

## 2020-12-12 DIAGNOSIS — M47812 Spondylosis without myelopathy or radiculopathy, cervical region: Secondary | ICD-10-CM | POA: Diagnosis not present

## 2020-12-14 DIAGNOSIS — M4327 Fusion of spine, lumbosacral region: Secondary | ICD-10-CM | POA: Diagnosis not present

## 2020-12-19 DIAGNOSIS — M4327 Fusion of spine, lumbosacral region: Secondary | ICD-10-CM | POA: Diagnosis not present

## 2020-12-20 DIAGNOSIS — G894 Chronic pain syndrome: Secondary | ICD-10-CM | POA: Diagnosis not present

## 2020-12-20 DIAGNOSIS — M4326 Fusion of spine, lumbar region: Secondary | ICD-10-CM | POA: Diagnosis not present

## 2020-12-22 DIAGNOSIS — M4327 Fusion of spine, lumbosacral region: Secondary | ICD-10-CM | POA: Diagnosis not present

## 2020-12-26 DIAGNOSIS — M4327 Fusion of spine, lumbosacral region: Secondary | ICD-10-CM | POA: Diagnosis not present

## 2020-12-29 DIAGNOSIS — M4327 Fusion of spine, lumbosacral region: Secondary | ICD-10-CM | POA: Diagnosis not present

## 2021-01-09 DIAGNOSIS — F319 Bipolar disorder, unspecified: Secondary | ICD-10-CM | POA: Diagnosis not present

## 2021-01-09 DIAGNOSIS — F411 Generalized anxiety disorder: Secondary | ICD-10-CM | POA: Diagnosis not present

## 2021-01-10 DIAGNOSIS — M4327 Fusion of spine, lumbosacral region: Secondary | ICD-10-CM | POA: Diagnosis not present

## 2021-01-16 DIAGNOSIS — M4327 Fusion of spine, lumbosacral region: Secondary | ICD-10-CM | POA: Diagnosis not present

## 2021-01-17 DIAGNOSIS — G894 Chronic pain syndrome: Secondary | ICD-10-CM | POA: Diagnosis not present

## 2021-01-17 DIAGNOSIS — M4326 Fusion of spine, lumbar region: Secondary | ICD-10-CM | POA: Diagnosis not present

## 2021-01-23 DIAGNOSIS — M4327 Fusion of spine, lumbosacral region: Secondary | ICD-10-CM | POA: Diagnosis not present

## 2021-01-26 DIAGNOSIS — M4327 Fusion of spine, lumbosacral region: Secondary | ICD-10-CM | POA: Diagnosis not present

## 2021-01-30 DIAGNOSIS — M4327 Fusion of spine, lumbosacral region: Secondary | ICD-10-CM | POA: Diagnosis not present

## 2021-01-31 DIAGNOSIS — M4326 Fusion of spine, lumbar region: Secondary | ICD-10-CM | POA: Diagnosis not present

## 2021-02-06 DIAGNOSIS — M4327 Fusion of spine, lumbosacral region: Secondary | ICD-10-CM | POA: Diagnosis not present

## 2021-02-10 DIAGNOSIS — M4327 Fusion of spine, lumbosacral region: Secondary | ICD-10-CM | POA: Diagnosis not present

## 2021-02-14 DIAGNOSIS — G894 Chronic pain syndrome: Secondary | ICD-10-CM | POA: Diagnosis not present

## 2021-02-14 DIAGNOSIS — M4326 Fusion of spine, lumbar region: Secondary | ICD-10-CM | POA: Diagnosis not present

## 2021-02-15 DIAGNOSIS — M4327 Fusion of spine, lumbosacral region: Secondary | ICD-10-CM | POA: Diagnosis not present

## 2021-02-20 DIAGNOSIS — M4327 Fusion of spine, lumbosacral region: Secondary | ICD-10-CM | POA: Diagnosis not present

## 2021-02-21 DIAGNOSIS — R41840 Attention and concentration deficit: Secondary | ICD-10-CM | POA: Diagnosis not present

## 2021-02-21 DIAGNOSIS — E039 Hypothyroidism, unspecified: Secondary | ICD-10-CM | POA: Diagnosis not present

## 2021-02-21 DIAGNOSIS — G4700 Insomnia, unspecified: Secondary | ICD-10-CM | POA: Diagnosis not present

## 2021-02-21 DIAGNOSIS — I10 Essential (primary) hypertension: Secondary | ICD-10-CM | POA: Diagnosis not present

## 2021-02-21 DIAGNOSIS — F322 Major depressive disorder, single episode, severe without psychotic features: Secondary | ICD-10-CM | POA: Diagnosis not present

## 2021-02-21 DIAGNOSIS — M797 Fibromyalgia: Secondary | ICD-10-CM | POA: Diagnosis not present

## 2021-02-21 DIAGNOSIS — R635 Abnormal weight gain: Secondary | ICD-10-CM | POA: Diagnosis not present

## 2021-02-21 DIAGNOSIS — M47812 Spondylosis without myelopathy or radiculopathy, cervical region: Secondary | ICD-10-CM | POA: Diagnosis not present

## 2021-02-21 DIAGNOSIS — E538 Deficiency of other specified B group vitamins: Secondary | ICD-10-CM | POA: Diagnosis not present

## 2021-02-23 DIAGNOSIS — M4327 Fusion of spine, lumbosacral region: Secondary | ICD-10-CM | POA: Diagnosis not present

## 2021-02-24 DIAGNOSIS — M4327 Fusion of spine, lumbosacral region: Secondary | ICD-10-CM | POA: Diagnosis not present

## 2021-02-27 DIAGNOSIS — M4327 Fusion of spine, lumbosacral region: Secondary | ICD-10-CM | POA: Diagnosis not present

## 2021-03-07 DIAGNOSIS — F411 Generalized anxiety disorder: Secondary | ICD-10-CM | POA: Diagnosis not present

## 2021-03-13 DIAGNOSIS — M4327 Fusion of spine, lumbosacral region: Secondary | ICD-10-CM | POA: Diagnosis not present

## 2021-03-16 DIAGNOSIS — M47812 Spondylosis without myelopathy or radiculopathy, cervical region: Secondary | ICD-10-CM | POA: Diagnosis not present

## 2021-03-16 DIAGNOSIS — I10 Essential (primary) hypertension: Secondary | ICD-10-CM | POA: Diagnosis not present

## 2021-03-16 DIAGNOSIS — E039 Hypothyroidism, unspecified: Secondary | ICD-10-CM | POA: Diagnosis not present

## 2021-03-16 DIAGNOSIS — F339 Major depressive disorder, recurrent, unspecified: Secondary | ICD-10-CM | POA: Diagnosis not present

## 2021-03-16 DIAGNOSIS — F322 Major depressive disorder, single episode, severe without psychotic features: Secondary | ICD-10-CM | POA: Diagnosis not present

## 2021-03-16 DIAGNOSIS — F329 Major depressive disorder, single episode, unspecified: Secondary | ICD-10-CM | POA: Diagnosis not present

## 2021-03-16 DIAGNOSIS — J40 Bronchitis, not specified as acute or chronic: Secondary | ICD-10-CM | POA: Diagnosis not present

## 2021-03-16 DIAGNOSIS — G4700 Insomnia, unspecified: Secondary | ICD-10-CM | POA: Diagnosis not present

## 2021-03-17 DIAGNOSIS — M4327 Fusion of spine, lumbosacral region: Secondary | ICD-10-CM | POA: Diagnosis not present

## 2021-03-20 DIAGNOSIS — M4327 Fusion of spine, lumbosacral region: Secondary | ICD-10-CM | POA: Diagnosis not present

## 2021-03-23 DIAGNOSIS — G894 Chronic pain syndrome: Secondary | ICD-10-CM | POA: Diagnosis not present

## 2021-03-23 DIAGNOSIS — M4326 Fusion of spine, lumbar region: Secondary | ICD-10-CM | POA: Diagnosis not present

## 2021-03-24 DIAGNOSIS — M4327 Fusion of spine, lumbosacral region: Secondary | ICD-10-CM | POA: Diagnosis not present

## 2021-03-30 DIAGNOSIS — M4327 Fusion of spine, lumbosacral region: Secondary | ICD-10-CM | POA: Diagnosis not present

## 2021-04-03 DIAGNOSIS — M4327 Fusion of spine, lumbosacral region: Secondary | ICD-10-CM | POA: Diagnosis not present

## 2021-04-06 DIAGNOSIS — M4327 Fusion of spine, lumbosacral region: Secondary | ICD-10-CM | POA: Diagnosis not present

## 2021-04-13 DIAGNOSIS — M4327 Fusion of spine, lumbosacral region: Secondary | ICD-10-CM | POA: Diagnosis not present

## 2021-04-20 DIAGNOSIS — G894 Chronic pain syndrome: Secondary | ICD-10-CM | POA: Diagnosis not present

## 2021-04-20 DIAGNOSIS — M4326 Fusion of spine, lumbar region: Secondary | ICD-10-CM | POA: Diagnosis not present

## 2021-04-20 DIAGNOSIS — M4327 Fusion of spine, lumbosacral region: Secondary | ICD-10-CM | POA: Diagnosis not present

## 2021-04-24 ENCOUNTER — Ambulatory Visit (INDEPENDENT_AMBULATORY_CARE_PROVIDER_SITE_OTHER): Payer: Medicare Other

## 2021-04-24 ENCOUNTER — Encounter: Payer: Self-pay | Admitting: Podiatry

## 2021-04-24 ENCOUNTER — Ambulatory Visit (INDEPENDENT_AMBULATORY_CARE_PROVIDER_SITE_OTHER): Payer: Medicare Other | Admitting: Podiatry

## 2021-04-24 ENCOUNTER — Other Ambulatory Visit: Payer: Self-pay

## 2021-04-24 DIAGNOSIS — M2041 Other hammer toe(s) (acquired), right foot: Secondary | ICD-10-CM | POA: Diagnosis not present

## 2021-04-24 DIAGNOSIS — M778 Other enthesopathies, not elsewhere classified: Secondary | ICD-10-CM

## 2021-04-24 DIAGNOSIS — M2042 Other hammer toe(s) (acquired), left foot: Secondary | ICD-10-CM

## 2021-04-24 DIAGNOSIS — M79671 Pain in right foot: Secondary | ICD-10-CM | POA: Diagnosis not present

## 2021-04-24 MED ORDER — TRIAMCINOLONE ACETONIDE 10 MG/ML IJ SUSP
10.0000 mg | Freq: Once | INTRAMUSCULAR | Status: AC
  Administered 2021-04-24: 10 mg

## 2021-04-26 NOTE — Progress Notes (Signed)
Subjective:   Patient ID: Carol Duncan, female   DOB: 70 y.o.   MRN: [id number]   HPI Patient presents with pain top of the foot right over left stating that she does not remember specific injury but states its been sore with activity.  Patient likes to be active and also is concerned about digital deformities that she wanted to show me.  Patient does not smoke likes to be active   Review of Systems  All other systems reviewed and are negative.       Objective:  Physical Exam Vitals and nursing note reviewed.  Constitutional:      Appearance: She is well-developed.  Pulmonary:     Effort: Pulmonary effort is normal.  Musculoskeletal:        General: Normal range of motion.  Skin:    General: Skin is warm.  Neurological:     Mental Status: She is alert.     Neurovascular status intact muscle strength was found to be adequate range of motion adequate.  Patient is found to have tenderness with inflammation pain of the dorsal right foot occurring across the MPJs.  It is also occurring within the midfoot when there is some enlargement around the area and there is digital elevation noted of the lesser digits bilateral.  Patient has good digital perfusion well oriented x3     Assessment:  Acute extensor tendinitis right inflammation with probability for low-grade arthritis around the area along with digital deformities bilateral     Plan:  H&P x-rays reviewed conditions discussed.  I will get a focus on the acute inflammation right and I did sterile prep injected the extensor complex 3 mg Dexasone Kenalog 5 mg Xylocaine advised on topical medicines and not tying her shoes tightly and I then went ahead and I discussed digital deformities recommended meshlike materials and wider toe boxes and will be seen back as needed  X-rays indicate that there is mild arthritis around this joint no indications of spurring or other pathology noted with digital deformities moderate nature  bilateral

## 2021-04-27 DIAGNOSIS — M4327 Fusion of spine, lumbosacral region: Secondary | ICD-10-CM | POA: Diagnosis not present

## 2021-05-01 DIAGNOSIS — F319 Bipolar disorder, unspecified: Secondary | ICD-10-CM | POA: Diagnosis not present

## 2021-05-01 DIAGNOSIS — F411 Generalized anxiety disorder: Secondary | ICD-10-CM | POA: Diagnosis not present

## 2021-05-09 DIAGNOSIS — M4327 Fusion of spine, lumbosacral region: Secondary | ICD-10-CM | POA: Diagnosis not present

## 2021-05-12 DIAGNOSIS — M4327 Fusion of spine, lumbosacral region: Secondary | ICD-10-CM | POA: Diagnosis not present

## 2021-05-18 DIAGNOSIS — M4327 Fusion of spine, lumbosacral region: Secondary | ICD-10-CM | POA: Diagnosis not present

## 2021-05-19 DIAGNOSIS — M4326 Fusion of spine, lumbar region: Secondary | ICD-10-CM | POA: Diagnosis not present

## 2021-05-19 DIAGNOSIS — G894 Chronic pain syndrome: Secondary | ICD-10-CM | POA: Diagnosis not present

## 2021-06-08 DIAGNOSIS — Z20822 Contact with and (suspected) exposure to covid-19: Secondary | ICD-10-CM | POA: Diagnosis not present

## 2021-06-14 DIAGNOSIS — M4326 Fusion of spine, lumbar region: Secondary | ICD-10-CM | POA: Diagnosis not present

## 2021-06-14 DIAGNOSIS — G894 Chronic pain syndrome: Secondary | ICD-10-CM | POA: Diagnosis not present

## 2021-06-16 DIAGNOSIS — M545 Low back pain, unspecified: Secondary | ICD-10-CM | POA: Diagnosis not present

## 2021-06-22 DIAGNOSIS — Z79899 Other long term (current) drug therapy: Secondary | ICD-10-CM | POA: Diagnosis not present

## 2021-06-22 DIAGNOSIS — Z79891 Long term (current) use of opiate analgesic: Secondary | ICD-10-CM | POA: Diagnosis not present

## 2021-06-22 DIAGNOSIS — G894 Chronic pain syndrome: Secondary | ICD-10-CM | POA: Diagnosis not present

## 2021-06-23 DIAGNOSIS — M545 Low back pain, unspecified: Secondary | ICD-10-CM | POA: Diagnosis not present

## 2021-07-07 DIAGNOSIS — M545 Low back pain, unspecified: Secondary | ICD-10-CM | POA: Diagnosis not present

## 2021-07-17 DIAGNOSIS — M545 Low back pain, unspecified: Secondary | ICD-10-CM | POA: Diagnosis not present

## 2021-07-20 DIAGNOSIS — M4326 Fusion of spine, lumbar region: Secondary | ICD-10-CM | POA: Diagnosis not present

## 2021-07-20 DIAGNOSIS — G894 Chronic pain syndrome: Secondary | ICD-10-CM | POA: Diagnosis not present

## 2021-07-25 DIAGNOSIS — M545 Low back pain, unspecified: Secondary | ICD-10-CM | POA: Diagnosis not present

## 2021-07-26 DIAGNOSIS — M545 Low back pain, unspecified: Secondary | ICD-10-CM | POA: Diagnosis not present

## 2021-08-01 DIAGNOSIS — M47816 Spondylosis without myelopathy or radiculopathy, lumbar region: Secondary | ICD-10-CM | POA: Diagnosis not present

## 2021-08-02 DIAGNOSIS — M47816 Spondylosis without myelopathy or radiculopathy, lumbar region: Secondary | ICD-10-CM | POA: Diagnosis not present

## 2021-08-07 DIAGNOSIS — M545 Low back pain, unspecified: Secondary | ICD-10-CM | POA: Diagnosis not present

## 2021-08-16 DIAGNOSIS — M545 Low back pain, unspecified: Secondary | ICD-10-CM | POA: Diagnosis not present

## 2021-08-17 DIAGNOSIS — M4326 Fusion of spine, lumbar region: Secondary | ICD-10-CM | POA: Diagnosis not present

## 2021-08-22 DIAGNOSIS — M48062 Spinal stenosis, lumbar region with neurogenic claudication: Secondary | ICD-10-CM | POA: Diagnosis not present

## 2021-08-23 DIAGNOSIS — Z20822 Contact with and (suspected) exposure to covid-19: Secondary | ICD-10-CM | POA: Diagnosis not present

## 2021-08-29 DIAGNOSIS — R41840 Attention and concentration deficit: Secondary | ICD-10-CM | POA: Diagnosis not present

## 2021-08-29 DIAGNOSIS — E039 Hypothyroidism, unspecified: Secondary | ICD-10-CM | POA: Diagnosis not present

## 2021-08-29 DIAGNOSIS — I10 Essential (primary) hypertension: Secondary | ICD-10-CM | POA: Diagnosis not present

## 2021-08-29 DIAGNOSIS — M48061 Spinal stenosis, lumbar region without neurogenic claudication: Secondary | ICD-10-CM | POA: Diagnosis not present

## 2021-08-31 DIAGNOSIS — M545 Low back pain, unspecified: Secondary | ICD-10-CM | POA: Diagnosis not present

## 2021-09-21 DIAGNOSIS — M48062 Spinal stenosis, lumbar region with neurogenic claudication: Secondary | ICD-10-CM | POA: Diagnosis not present

## 2021-09-21 DIAGNOSIS — I10 Essential (primary) hypertension: Secondary | ICD-10-CM | POA: Diagnosis not present

## 2021-09-21 DIAGNOSIS — Z6826 Body mass index (BMI) 26.0-26.9, adult: Secondary | ICD-10-CM | POA: Diagnosis not present

## 2021-09-21 DIAGNOSIS — M4326 Fusion of spine, lumbar region: Secondary | ICD-10-CM | POA: Diagnosis not present

## 2021-10-04 DIAGNOSIS — M48062 Spinal stenosis, lumbar region with neurogenic claudication: Secondary | ICD-10-CM | POA: Diagnosis not present

## 2021-10-23 DIAGNOSIS — Z20822 Contact with and (suspected) exposure to covid-19: Secondary | ICD-10-CM | POA: Diagnosis not present

## 2021-10-23 DIAGNOSIS — F411 Generalized anxiety disorder: Secondary | ICD-10-CM | POA: Diagnosis not present

## 2021-10-23 DIAGNOSIS — F319 Bipolar disorder, unspecified: Secondary | ICD-10-CM | POA: Diagnosis not present

## 2021-10-26 DIAGNOSIS — M48062 Spinal stenosis, lumbar region with neurogenic claudication: Secondary | ICD-10-CM | POA: Diagnosis not present

## 2021-10-26 DIAGNOSIS — M4326 Fusion of spine, lumbar region: Secondary | ICD-10-CM | POA: Diagnosis not present

## 2021-11-22 DIAGNOSIS — Z23 Encounter for immunization: Secondary | ICD-10-CM | POA: Diagnosis not present

## 2021-11-27 DIAGNOSIS — M4326 Fusion of spine, lumbar region: Secondary | ICD-10-CM | POA: Diagnosis not present

## 2021-11-27 DIAGNOSIS — M48062 Spinal stenosis, lumbar region with neurogenic claudication: Secondary | ICD-10-CM | POA: Diagnosis not present

## 2021-11-27 DIAGNOSIS — Z20822 Contact with and (suspected) exposure to covid-19: Secondary | ICD-10-CM | POA: Diagnosis not present

## 2021-12-19 DIAGNOSIS — Z20822 Contact with and (suspected) exposure to covid-19: Secondary | ICD-10-CM | POA: Diagnosis not present

## 2021-12-25 DIAGNOSIS — M4326 Fusion of spine, lumbar region: Secondary | ICD-10-CM | POA: Diagnosis not present

## 2021-12-25 DIAGNOSIS — Z79899 Other long term (current) drug therapy: Secondary | ICD-10-CM | POA: Diagnosis not present

## 2021-12-25 DIAGNOSIS — M48062 Spinal stenosis, lumbar region with neurogenic claudication: Secondary | ICD-10-CM | POA: Diagnosis not present

## 2021-12-25 DIAGNOSIS — Z79891 Long term (current) use of opiate analgesic: Secondary | ICD-10-CM | POA: Diagnosis not present

## 2021-12-25 DIAGNOSIS — G894 Chronic pain syndrome: Secondary | ICD-10-CM | POA: Diagnosis not present

## 2022-01-03 DIAGNOSIS — M25562 Pain in left knee: Secondary | ICD-10-CM | POA: Diagnosis not present

## 2022-01-03 DIAGNOSIS — M25561 Pain in right knee: Secondary | ICD-10-CM | POA: Diagnosis not present

## 2022-01-03 DIAGNOSIS — M170 Bilateral primary osteoarthritis of knee: Secondary | ICD-10-CM | POA: Diagnosis not present

## 2022-01-15 DIAGNOSIS — Z20822 Contact with and (suspected) exposure to covid-19: Secondary | ICD-10-CM | POA: Diagnosis not present

## 2022-01-23 DIAGNOSIS — Z20822 Contact with and (suspected) exposure to covid-19: Secondary | ICD-10-CM | POA: Diagnosis not present

## 2022-01-24 DIAGNOSIS — M48062 Spinal stenosis, lumbar region with neurogenic claudication: Secondary | ICD-10-CM | POA: Diagnosis not present

## 2022-01-24 DIAGNOSIS — M4326 Fusion of spine, lumbar region: Secondary | ICD-10-CM | POA: Diagnosis not present

## 2022-02-01 DIAGNOSIS — Z20822 Contact with and (suspected) exposure to covid-19: Secondary | ICD-10-CM | POA: Diagnosis not present

## 2022-02-13 DIAGNOSIS — M25561 Pain in right knee: Secondary | ICD-10-CM | POA: Diagnosis not present

## 2022-02-13 DIAGNOSIS — M25562 Pain in left knee: Secondary | ICD-10-CM | POA: Diagnosis not present

## 2022-02-22 DIAGNOSIS — M48062 Spinal stenosis, lumbar region with neurogenic claudication: Secondary | ICD-10-CM | POA: Diagnosis not present

## 2022-02-22 DIAGNOSIS — M5106 Intervertebral disc disorders with myelopathy, lumbar region: Secondary | ICD-10-CM | POA: Diagnosis not present

## 2022-02-22 DIAGNOSIS — M4326 Fusion of spine, lumbar region: Secondary | ICD-10-CM | POA: Diagnosis not present

## 2022-03-08 DIAGNOSIS — Z20822 Contact with and (suspected) exposure to covid-19: Secondary | ICD-10-CM | POA: Diagnosis not present

## 2022-03-14 DIAGNOSIS — Z20822 Contact with and (suspected) exposure to covid-19: Secondary | ICD-10-CM | POA: Diagnosis not present

## 2022-03-15 DIAGNOSIS — Z20822 Contact with and (suspected) exposure to covid-19: Secondary | ICD-10-CM | POA: Diagnosis not present

## 2022-03-22 DIAGNOSIS — M4326 Fusion of spine, lumbar region: Secondary | ICD-10-CM | POA: Diagnosis not present

## 2022-03-22 DIAGNOSIS — Z20828 Contact with and (suspected) exposure to other viral communicable diseases: Secondary | ICD-10-CM | POA: Diagnosis not present

## 2022-03-22 DIAGNOSIS — M79604 Pain in right leg: Secondary | ICD-10-CM | POA: Diagnosis not present

## 2022-03-22 DIAGNOSIS — M48062 Spinal stenosis, lumbar region with neurogenic claudication: Secondary | ICD-10-CM | POA: Diagnosis not present

## 2022-03-27 DIAGNOSIS — Z20822 Contact with and (suspected) exposure to covid-19: Secondary | ICD-10-CM | POA: Diagnosis not present

## 2022-03-27 DIAGNOSIS — Z20828 Contact with and (suspected) exposure to other viral communicable diseases: Secondary | ICD-10-CM | POA: Diagnosis not present

## 2022-04-06 DIAGNOSIS — Z20822 Contact with and (suspected) exposure to covid-19: Secondary | ICD-10-CM | POA: Diagnosis not present

## 2022-04-10 DIAGNOSIS — Z20822 Contact with and (suspected) exposure to covid-19: Secondary | ICD-10-CM | POA: Diagnosis not present

## 2022-04-10 DIAGNOSIS — M48062 Spinal stenosis, lumbar region with neurogenic claudication: Secondary | ICD-10-CM | POA: Diagnosis not present

## 2022-04-16 DIAGNOSIS — Z20822 Contact with and (suspected) exposure to covid-19: Secondary | ICD-10-CM | POA: Diagnosis not present

## 2022-04-18 DIAGNOSIS — F319 Bipolar disorder, unspecified: Secondary | ICD-10-CM | POA: Diagnosis not present

## 2022-04-18 DIAGNOSIS — F411 Generalized anxiety disorder: Secondary | ICD-10-CM | POA: Diagnosis not present

## 2022-04-23 DIAGNOSIS — Z20822 Contact with and (suspected) exposure to covid-19: Secondary | ICD-10-CM | POA: Diagnosis not present

## 2022-04-24 DIAGNOSIS — M79604 Pain in right leg: Secondary | ICD-10-CM | POA: Diagnosis not present

## 2022-04-24 DIAGNOSIS — M48062 Spinal stenosis, lumbar region with neurogenic claudication: Secondary | ICD-10-CM | POA: Diagnosis not present

## 2022-04-24 DIAGNOSIS — M4326 Fusion of spine, lumbar region: Secondary | ICD-10-CM | POA: Diagnosis not present

## 2022-04-30 ENCOUNTER — Encounter: Payer: Self-pay | Admitting: Podiatry

## 2022-04-30 ENCOUNTER — Ambulatory Visit (INDEPENDENT_AMBULATORY_CARE_PROVIDER_SITE_OTHER): Payer: Medicare Other | Admitting: Podiatry

## 2022-04-30 ENCOUNTER — Ambulatory Visit (INDEPENDENT_AMBULATORY_CARE_PROVIDER_SITE_OTHER): Payer: Medicare Other

## 2022-04-30 DIAGNOSIS — M778 Other enthesopathies, not elsewhere classified: Secondary | ICD-10-CM | POA: Diagnosis not present

## 2022-04-30 DIAGNOSIS — M722 Plantar fascial fibromatosis: Secondary | ICD-10-CM | POA: Diagnosis not present

## 2022-04-30 MED ORDER — TRIAMCINOLONE ACETONIDE 10 MG/ML IJ SUSP
20.0000 mg | Freq: Once | INTRAMUSCULAR | Status: AC
  Administered 2022-04-30: 20 mg

## 2022-05-02 NOTE — Progress Notes (Signed)
Subjective:  ? ?Patient ID: Noreli Freyman, female   DOB: 71 y.o.   MRN: [id number]  ? ?HPI ?Patient states she is getting pain again on the top of her right foot and it did well for around a year and her heel has also been hurting her at the insertion of the tendon into the calcaneus.  States she has 2 defined areas of pain ? ? ?ROS ? ? ?   ?Objective:  ?Physical Exam  ?Neurovascular status intact with extensor tendinitis along with probable midtarsal joint arthritis right and exquisite discomfort at the insertional point of the tendon into the calcaneus right fascia ? ?   ?Assessment:  ?Extensor tendinitis right along with acute Planter fasciitis right ? ?   ?Plan:  ?H&P reviewed condition and x-ray and I do think injection of both areas would be of benefit and I did explain risk of rupture associated with injection she is willing to accept and I did standard prep injected the extensor complex 3 mg dexamethasone Kenalog 5 mg Xylocaine and injected the plantar fascial insertion 3 mg Kenalog 5 mg Xylocaine and patient will be checked back again in another several weeks earlier if needed ? ?X-rays do indicate moderate midtarsal joint arthritis no indications of stress fracture small plantar spur noted heel ?   ? ? ?

## 2022-05-16 DIAGNOSIS — Z79890 Hormone replacement therapy (postmenopausal): Secondary | ICD-10-CM | POA: Diagnosis not present

## 2022-05-16 DIAGNOSIS — E039 Hypothyroidism, unspecified: Secondary | ICD-10-CM | POA: Diagnosis not present

## 2022-05-16 DIAGNOSIS — R319 Hematuria, unspecified: Secondary | ICD-10-CM | POA: Diagnosis not present

## 2022-05-16 DIAGNOSIS — I10 Essential (primary) hypertension: Secondary | ICD-10-CM | POA: Diagnosis not present

## 2022-05-16 DIAGNOSIS — F322 Major depressive disorder, single episode, severe without psychotic features: Secondary | ICD-10-CM | POA: Diagnosis not present

## 2022-05-16 DIAGNOSIS — R41840 Attention and concentration deficit: Secondary | ICD-10-CM | POA: Diagnosis not present

## 2022-05-16 DIAGNOSIS — B001 Herpesviral vesicular dermatitis: Secondary | ICD-10-CM | POA: Diagnosis not present

## 2022-05-16 DIAGNOSIS — M48061 Spinal stenosis, lumbar region without neurogenic claudication: Secondary | ICD-10-CM | POA: Diagnosis not present

## 2022-05-21 ENCOUNTER — Observation Stay (HOSPITAL_COMMUNITY)
Admission: EM | Admit: 2022-05-21 | Discharge: 2022-05-22 | Disposition: A | Discharge status: Home / Self Care | Payer: Medicare Other | Attending: Family Medicine | Admitting: Family Medicine

## 2022-05-21 ENCOUNTER — Emergency Department (HOSPITAL_COMMUNITY): Payer: Medicare Other

## 2022-05-21 DIAGNOSIS — Z79899 Other long term (current) drug therapy: Secondary | ICD-10-CM | POA: Diagnosis not present

## 2022-05-21 DIAGNOSIS — R0602 Shortness of breath: Secondary | ICD-10-CM | POA: Diagnosis not present

## 2022-05-21 DIAGNOSIS — E039 Hypothyroidism, unspecified: Secondary | ICD-10-CM | POA: Insufficient documentation

## 2022-05-21 DIAGNOSIS — E876 Hypokalemia: Secondary | ICD-10-CM | POA: Insufficient documentation

## 2022-05-21 DIAGNOSIS — T781XXA Other adverse food reactions, not elsewhere classified, initial encounter: Principal | ICD-10-CM | POA: Insufficient documentation

## 2022-05-21 DIAGNOSIS — E860 Dehydration: Secondary | ICD-10-CM | POA: Insufficient documentation

## 2022-05-21 DIAGNOSIS — Z91013 Allergy to seafood: Secondary | ICD-10-CM | POA: Diagnosis not present

## 2022-05-21 DIAGNOSIS — I959 Hypotension, unspecified: Secondary | ICD-10-CM | POA: Diagnosis not present

## 2022-05-21 DIAGNOSIS — R0902 Hypoxemia: Secondary | ICD-10-CM | POA: Diagnosis not present

## 2022-05-21 DIAGNOSIS — T782XXA Anaphylactic shock, unspecified, initial encounter: Secondary | ICD-10-CM | POA: Diagnosis not present

## 2022-05-21 DIAGNOSIS — R1084 Generalized abdominal pain: Secondary | ICD-10-CM | POA: Diagnosis not present

## 2022-05-21 DIAGNOSIS — J9811 Atelectasis: Secondary | ICD-10-CM | POA: Diagnosis not present

## 2022-05-21 DIAGNOSIS — I10 Essential (primary) hypertension: Secondary | ICD-10-CM | POA: Diagnosis not present

## 2022-05-21 DIAGNOSIS — R079 Chest pain, unspecified: Secondary | ICD-10-CM | POA: Diagnosis not present

## 2022-05-21 DIAGNOSIS — N179 Acute kidney failure, unspecified: Secondary | ICD-10-CM | POA: Diagnosis not present

## 2022-05-21 DIAGNOSIS — R0789 Other chest pain: Secondary | ICD-10-CM | POA: Diagnosis not present

## 2022-05-21 LAB — CBC
HCT: 42 % (ref 36.0–46.0)
Hemoglobin: 13.9 g/dL (ref 12.0–15.0)
MCH: 30 pg (ref 26.0–34.0)
MCHC: 33.1 g/dL (ref 30.0–36.0)
MCV: 90.5 fL (ref 80.0–100.0)
Platelets: 188 10*3/uL (ref 150–400)
RBC: 4.64 MIL/uL (ref 3.87–5.11)
RDW: 15.4 % (ref 11.5–15.5)
WBC: 6.1 10*3/uL (ref 4.0–10.5)
nRBC: 0 % (ref 0.0–0.2)

## 2022-05-21 LAB — URINALYSIS, ROUTINE W REFLEX MICROSCOPIC
Bilirubin Urine: NEGATIVE
Glucose, UA: NEGATIVE mg/dL
Ketones, ur: NEGATIVE mg/dL
Leukocytes,Ua: NEGATIVE
Nitrite: NEGATIVE
Protein, ur: NEGATIVE mg/dL
Specific Gravity, Urine: 1.008 (ref 1.005–1.030)
pH: 5 (ref 5.0–8.0)

## 2022-05-21 LAB — BASIC METABOLIC PANEL
Anion gap: 13 (ref 5–15)
Anion gap: 9 (ref 5–15)
Anion gap: 5 (ref 5–15)
BUN: 23 mg/dL (ref 8–23)
BUN: 22 mg/dL (ref 8–23)
BUN: 18 mg/dL (ref 8–23)
CO2: 23 mmol/L (ref 22–32)
CO2: 22 mmol/L (ref 22–32)
CO2: 22 mmol/L (ref 22–32)
Calcium: 10.7 mg/dL — ABNORMAL HIGH (ref 8.9–10.3)
Calcium: 8.6 mg/dL — ABNORMAL LOW (ref 8.9–10.3)
Calcium: 8.6 mg/dL — ABNORMAL LOW (ref 8.9–10.3)
Chloride: 102 mmol/L (ref 98–111)
Chloride: 112 mmol/L — ABNORMAL HIGH (ref 98–111)
Chloride: 107 mmol/L (ref 98–111)
Creatinine, Ser: 2.25 mg/dL — ABNORMAL HIGH (ref 0.44–1.00)
Creatinine, Ser: 1.95 mg/dL — ABNORMAL HIGH (ref 0.44–1.00)
Creatinine, Ser: 1.26 mg/dL — ABNORMAL HIGH (ref 0.44–1.00)
GFR, Estimated: 23 mL/min — ABNORMAL LOW (ref >60)
GFR, Estimated: 27 mL/min — ABNORMAL LOW (ref >60)
GFR, Estimated: 46 mL/min — ABNORMAL LOW (ref >60)
Glucose, Bld: 133 mg/dL — ABNORMAL HIGH (ref 70–99)
Glucose, Bld: 136 mg/dL — ABNORMAL HIGH (ref 70–99)
Glucose, Bld: 146 mg/dL — ABNORMAL HIGH (ref 70–99)
Potassium: 3.3 mmol/L — ABNORMAL LOW (ref 3.5–5.1)
Potassium: 2.5 mmol/L — CL (ref 3.5–5.1)
Potassium: 4.3 mmol/L (ref 3.5–5.1)
Sodium: 138 mmol/L (ref 135–145)
Sodium: 143 mmol/L (ref 135–145)
Sodium: 134 mmol/L — ABNORMAL LOW (ref 135–145)

## 2022-05-21 LAB — MAGNESIUM
Magnesium: 1.3 mg/dL — ABNORMAL LOW (ref 1.7–2.4)
Magnesium: 1.9 mg/dL (ref 1.7–2.4)

## 2022-05-21 LAB — HIV ANTIBODY (ROUTINE TESTING W REFLEX): HIV Screen 4th Generation wRfx: NONREACTIVE

## 2022-05-21 MED ORDER — LAMOTRIGINE 100 MG PO TABS
200.0000 mg | ORAL_TABLET | ORAL | Status: DC
  Administered 2022-05-21 – 2022-05-22 (×2): 200 mg via ORAL
  Filled 2022-05-21 (×4): qty 2

## 2022-05-21 MED ORDER — DIPHENHYDRAMINE HCL 50 MG/ML IJ SOLN
25.0000 mg | Freq: Once | INTRAMUSCULAR | Status: AC
  Administered 2022-05-21: 25 mg via INTRAVENOUS
  Filled 2022-05-21: qty 1

## 2022-05-21 MED ORDER — POTASSIUM CHLORIDE 10 MEQ/100ML IV SOLN
10.0000 meq | Freq: Once | INTRAVENOUS | Status: AC
  Administered 2022-05-21: 10 meq via INTRAVENOUS
  Filled 2022-05-21: qty 100

## 2022-05-21 MED ORDER — SODIUM CHLORIDE 0.9 % IV SOLN: INTRAVENOUS | Status: DC

## 2022-05-21 MED ORDER — POTASSIUM CHLORIDE CRYS ER 20 MEQ PO TBCR
40.0000 meq | EXTENDED_RELEASE_TABLET | Freq: Once | ORAL | Status: AC
  Administered 2022-05-21: 40 meq via ORAL
  Filled 2022-05-21: qty 2

## 2022-05-21 MED ORDER — DIPHENHYDRAMINE HCL 50 MG/ML IJ SOLN: 12.5000 mg | Freq: Four times a day (QID) | INTRAMUSCULAR | Status: DC | PRN

## 2022-05-21 MED ORDER — ESTRADIOL 1 MG PO TABS
1.0000 mg | ORAL_TABLET | Freq: Every day | ORAL | Status: DC
  Administered 2022-05-22: 1 mg via ORAL
  Filled 2022-05-21: qty 1

## 2022-05-21 MED ORDER — ALBUTEROL SULFATE (2.5 MG/3ML) 0.083% IN NEBU: 2.5000 mg | INHALATION_SOLUTION | Freq: Four times a day (QID) | RESPIRATORY_TRACT | Status: DC | PRN

## 2022-05-21 MED ORDER — DIPHENHYDRAMINE HCL 50 MG/ML IJ SOLN
12.5000 mg | Freq: Once | INTRAMUSCULAR | Status: AC
  Administered 2022-05-21: 12.5 mg via INTRAVENOUS
  Filled 2022-05-21: qty 1

## 2022-05-21 MED ORDER — SODIUM CHLORIDE 0.9 % IV BOLUS (SEPSIS)
500.0000 mL | Freq: Once | INTRAVENOUS | Status: AC
  Administered 2022-05-21: 500 mL via INTRAVENOUS

## 2022-05-21 MED ORDER — MAGNESIUM SULFATE 2 GM/50ML IV SOLN
2.0000 g | Freq: Once | INTRAVENOUS | Status: AC
  Administered 2022-05-21: 2 g via INTRAVENOUS
  Filled 2022-05-21: qty 50

## 2022-05-21 MED ORDER — PREDNISONE 50 MG PO TABS
60.0000 mg | ORAL_TABLET | Freq: Every day | ORAL | Status: DC
  Administered 2022-05-21 – 2022-05-22 (×2): 60 mg via ORAL
  Filled 2022-05-21: qty 3
  Filled 2022-05-21: qty 1

## 2022-05-21 MED ORDER — DIPHENHYDRAMINE HCL 25 MG PO CAPS
50.0000 mg | ORAL_CAPSULE | Freq: Four times a day (QID) | ORAL | Status: AC
  Administered 2022-05-21 – 2022-05-22 (×3): 50 mg via ORAL
  Filled 2022-05-21 (×3): qty 2

## 2022-05-21 MED ORDER — ONDANSETRON HCL 4 MG/2ML IJ SOLN: 4.0000 mg | Freq: Four times a day (QID) | INTRAMUSCULAR | Status: DC | PRN

## 2022-05-21 MED ORDER — HEPARIN SODIUM (PORCINE) 5000 UNIT/ML IJ SOLN
5000.0000 [IU] | Freq: Three times a day (TID) | INTRAMUSCULAR | Status: DC
  Filled 2022-05-21 (×3): qty 1

## 2022-05-21 MED ORDER — EPINEPHRINE 0.3 MG/0.3ML IJ SOAJ
0.3000 mg | Freq: Once | INTRAMUSCULAR | Status: AC
Start: 2022-05-21 — End: 2022-05-21
  Administered 2022-05-21: 0.3 mg via INTRAMUSCULAR

## 2022-05-21 MED ORDER — ACETAMINOPHEN 650 MG RE SUPP: 650.0000 mg | Freq: Four times a day (QID) | RECTAL | Status: DC | PRN

## 2022-05-21 MED ORDER — ONDANSETRON HCL 4 MG PO TABS: 4.0000 mg | ORAL_TABLET | Freq: Four times a day (QID) | ORAL | Status: DC | PRN

## 2022-05-21 MED ORDER — ACETAMINOPHEN 325 MG PO TABS: 650.0000 mg | ORAL_TABLET | Freq: Four times a day (QID) | ORAL | Status: DC | PRN

## 2022-05-21 MED ORDER — LACTATED RINGERS IV BOLUS
500.0000 mL | Freq: Once | INTRAVENOUS | Status: AC
  Administered 2022-05-21: 500 mL via INTRAVENOUS

## 2022-05-21 MED ORDER — EPINEPHRINE 0.3 MG/0.3ML IJ SOAJ: 0.3000 mg | INTRAMUSCULAR | 0 refills | Status: DC | PRN

## 2022-05-21 MED ORDER — FAMOTIDINE IN NACL 20-0.9 MG/50ML-% IV SOLN
20.0000 mg | Freq: Once | INTRAVENOUS | Status: AC
  Administered 2022-05-21: 20 mg via INTRAVENOUS
  Filled 2022-05-21: qty 50

## 2022-05-21 MED ORDER — PREGABALIN 75 MG PO CAPS
75.0000 mg | ORAL_CAPSULE | Freq: Two times a day (BID) | ORAL | Status: DC
  Administered 2022-05-21 – 2022-05-22 (×3): 75 mg via ORAL
  Filled 2022-05-21: qty 1
  Filled 2022-05-21: qty 3
  Filled 2022-05-21: qty 1

## 2022-05-21 MED ORDER — DIPHENHYDRAMINE HCL 25 MG PO CAPS: 25.0000 mg | ORAL_CAPSULE | Freq: Four times a day (QID) | ORAL | Status: DC | PRN

## 2022-05-21 MED ORDER — ALBUTEROL SULFATE (2.5 MG/3ML) 0.083% IN NEBU
5.0000 mg | INHALATION_SOLUTION | Freq: Once | RESPIRATORY_TRACT | Status: AC
  Administered 2022-05-21: 5 mg via RESPIRATORY_TRACT
  Filled 2022-05-21: qty 6

## 2022-05-21 MED ORDER — ALPRAZOLAM 0.25 MG PO TABS: 0.5000 mg | ORAL_TABLET | Freq: Three times a day (TID) | ORAL | Status: DC | PRN

## 2022-05-21 MED ORDER — HYDROCODONE-ACETAMINOPHEN 10-325 MG PO TABS
1.0000 | ORAL_TABLET | Freq: Four times a day (QID) | ORAL | Status: DC | PRN
  Administered 2022-05-21 – 2022-05-22 (×4): 1 via ORAL
  Filled 2022-05-21: qty 2
  Filled 2022-05-21 (×3): qty 1

## 2022-05-21 MED ORDER — ALBUTEROL SULFATE (2.5 MG/3ML) 0.083% IN NEBU: 2.5000 mg | INHALATION_SOLUTION | RESPIRATORY_TRACT | Status: DC | PRN

## 2022-05-21 MED ORDER — QUETIAPINE FUMARATE 50 MG PO TABS
25.0000 mg | ORAL_TABLET | Freq: Every day | ORAL | Status: DC
  Administered 2022-05-21: 25 mg via ORAL
  Filled 2022-05-21: qty 1

## 2022-05-21 MED ORDER — SODIUM CHLORIDE 0.9 % IV BOLUS (SEPSIS)
1000.0000 mL | Freq: Once | INTRAVENOUS | Status: AC
  Administered 2022-05-21: 1000 mL via INTRAVENOUS

## 2022-05-21 MED ORDER — FAMOTIDINE 20 MG PO TABS
20.0000 mg | ORAL_TABLET | Freq: Two times a day (BID) | ORAL | Status: DC
Start: 2022-05-21 — End: 2022-05-22
  Administered 2022-05-21 – 2022-05-22 (×2): 20 mg via ORAL
  Filled 2022-05-21 (×3): qty 1

## 2022-05-21 MED ORDER — MAGNESIUM SULFATE IN D5W 1-5 GM/100ML-% IV SOLN
1.0000 g | Freq: Once | INTRAVENOUS | Status: AC
  Administered 2022-05-21: 1 g via INTRAVENOUS
  Filled 2022-05-21: qty 100

## 2022-05-21 MED ORDER — DIPHENHYDRAMINE HCL 25 MG PO CAPS
25.0000 mg | ORAL_CAPSULE | Freq: Once | ORAL | Status: AC
  Administered 2022-05-21: 25 mg via ORAL

## 2022-05-21 MED ORDER — SODIUM CHLORIDE 0.9% FLUSH
3.0000 mL | Freq: Two times a day (BID) | INTRAVENOUS | Status: DC
  Administered 2022-05-21: 3 mL via INTRAVENOUS

## 2022-05-21 MED ORDER — SODIUM CHLORIDE 0.9 % IV BOLUS
1000.0000 mL | Freq: Once | INTRAVENOUS | Status: AC
  Administered 2022-05-21: 1000 mL via INTRAVENOUS

## 2022-05-21 MED ORDER — VALACYCLOVIR HCL 500 MG PO TABS
500.0000 mg | ORAL_TABLET | Freq: Every day | ORAL | Status: DC
  Administered 2022-05-22: 500 mg via ORAL
  Filled 2022-05-21: qty 1

## 2022-05-21 MED ORDER — LEVOTHYROXINE SODIUM 50 MCG PO TABS
50.0000 ug | ORAL_TABLET | Freq: Every morning | ORAL | Status: DC
  Administered 2022-05-21: 50 ug via ORAL
  Filled 2022-05-21: qty 1
  Filled 2022-05-21: qty 2

## 2022-05-21 NOTE — ED Provider Notes (Signed)
[facility] EMERGENCY DEPARTMENT Provider Note   CSN: [id number] Arrival date & time: 05/21/22  0023     History  Chief Complaint  Patient presents with   Allergic Reaction    Carol Duncan is a 71 y.o. female.  The history is provided by the patient.  Allergic Reaction Presenting symptoms: difficulty breathing, itching and rash   Severity:  Severe Context: food   Worsened by:  Nothing Patient presents for allergic reaction.  She was brought via EMS.  She reports she ate a burger and chips tonight from a restaurant.  This was around 8 PM on June 4.  About 2 hours later she started having itching in her left arm.  She took Benadryl, and soon after began having shortness of breath and EMS was called.  EMS gave patient epinephrine, albuterol and establish an IV.  She also reports vomiting and diarrhea.  She denies any syncope but reports lightheadedness   Patient with known history of allergy to shellfish Home Medications Prior to Admission medications   Medication Sig Start Date End Date Taking? Authorizing Provider  ALPRAZolam Prudy Feeler) 1 MG tablet Take 0.5 mg by mouth 3 (three) times daily as needed for anxiety.    Yes [provider]  doxycycline (VIBRAMYCIN) 100 MG capsule Take 100 mg by mouth 2 (two) times daily as needed. 05/16/22  Yes [provider]  EPINEPHrine 0.3 mg/0.3 mL IJ SOAJ injection Inject 0.3 mg into the muscle as needed for anaphylaxis. 05/21/22  Yes Zadie Rhine, MD  estradiol (ESTRACE) 1 MG tablet Take 1 mg by mouth daily. 04/15/21  Yes [provider]  HYDROcodone-acetaminophen (NORCO) 10-325 MG per tablet Take 1-2 tablets by mouth every 6 (six) hours as needed for severe pain (takes one-half tablet).    Yes [provider]  lamoTRIgine (LAMICTAL) 200 MG tablet Take 200 mg by mouth every morning.   Yes [provider]  levothyroxine (SYNTHROID) 50 MCG tablet Take 50 mcg by mouth every  morning. 02/12/21  Yes [provider]  naproxen (NAPROSYN) 500 MG tablet Take 500 mg by mouth 2 (two) times daily as needed for mild pain. 03/29/22  Yes [provider]  pregabalin (LYRICA) 75 MG capsule Take 75 mg by mouth 2 (two) times daily.   Yes [provider]  QUEtiapine (SEROQUEL) 25 MG tablet Take 25 mg by mouth at bedtime.   Yes [provider]  tiZANidine (ZANAFLEX) 4 MG tablet Take 4 mg by mouth every 6 (six) hours as needed for muscle spasms.   Yes [provider]  valACYclovir (VALTREX) 500 MG tablet Take 500 mg by mouth daily. As needed for outbreaks 09/23/15  Yes [provider]  valsartan-hydrochlorothiazide (DIOVAN-HCT) 80-12.5 MG tablet Take 1 tablet by mouth daily.   Yes [provider]      Allergies    Shellfish allergy and Wheat bran    Review of Systems   Review of Systems  Respiratory:  Positive for shortness of breath.   Gastrointestinal:  Positive for diarrhea and vomiting.  Skin:  Positive for itching and rash.  Neurological:  Negative for syncope.   Physical Exam Updated Vital Signs BP (!) 104/57   Pulse (!) 109   Temp 97.8 F (36.6 C) (Oral)   Resp 17   SpO2 98%  Physical Exam CONSTITUTIONAL: Well developed/well nourished, no distress HEAD: Normocephalic/atraumatic EYES: EOMI/PERRL ENMT: Mucous membranes moist, no angioedema, no stridor, no drooling NECK: supple no meningeal signs SPINE/BACK:entire  spine nontender CV: S1/S2 noted, no murmurs/rubs/gallops noted LUNGS: Lungs are clear to auscultation bilaterally, no apparent distress, no wheeze ABDOMEN: soft, nontender, no rebound or guarding, bowel sounds noted throughout abdomen GU:no cva tenderness NEURO: Pt is awake/alert/appropriate, moves all extremitiesx4.  No facial droop.   EXTREMITIES: pulses normal/equal, full ROM SKIN: warm, color normal, minimal erythema noted left upper extremity PSYCH: no abnormalities of mood noted, alert  and oriented to situation  ED Results / Procedures / Treatments   Labs (all labs ordered are listed, but only abnormal results are displayed) Labs Reviewed  BASIC METABOLIC PANEL - Abnormal; Notable for the following components:      Result Value   Potassium 3.3 (*)    Glucose, Bld 133 (*)    Creatinine, Ser 2.25 (*)    Calcium 10.7 (*)    GFR, Estimated 23 (*)    All other components within normal limits  BASIC METABOLIC PANEL - Abnormal; Notable for the following components:   Potassium 2.5 (*)    Chloride 112 (*)    Glucose, Bld 136 (*)    Creatinine, Ser 1.95 (*)    Calcium 8.6 (*)    GFR, Estimated 27 (*)    All other components within normal limits  MAGNESIUM - Abnormal; Notable for the following components:   Magnesium 1.3 (*)    All other components within normal limits  CBC    EKG EKG Interpretation  Date/Time:  Monday May 21 2022 00:41:00 EDT Ventricular Rate:  70 PR Interval:  153 QRS Duration: 116 QT Interval:  421 QTC Calculation: 455 R Axis:   -15 Text Interpretation: Sinus rhythm Nonspecific intraventricular conduction delay Low voltage, precordial leads Confirmed by Zadie Rhine ([id number]) on 05/21/2022 12:50:08 AM  Radiology DG Chest Port 1 View  Result Date: 05/21/2022 CLINICAL DATA:  Shortness of breath. EXAM: PORTABLE CHEST 1 VIEW COMPARISON:  Radiograph 05/03/2017 FINDINGS: Lung volumes are low. The heart is prominent in size. Bibasilar atelectasis. Bronchovascular crowding related to low lung volumes versus mild vascular congestion. No pneumothorax or pleural effusion. Remote right clavicle fracture. IMPRESSION: 1. Low lung volumes with bibasilar atelectasis. 2. Prominent heart size, may be accentuated by technique and low lung volumes. Bronchovascular crowding versus vascular congestion. Electronically Signed   By: Narda Rutherford M.D.   On: 05/21/2022 01:14    Procedures .Critical Care Performed by: Zadie Rhine, MD Authorized by: Zadie Rhine, MD   Critical care provider statement:    Critical care time (minutes):  60   Critical care start time:  05/21/2022 3:20 AM   Critical care end time:  05/21/2022 4:20 AM   Critical care time was exclusive of:  Separately billable procedures and treating other patients   Critical care was necessary to treat or prevent imminent or life-threatening deterioration of the following conditions:  Shock, dehydration and metabolic crisis   Critical care was time spent personally by me on the following activities:  Development of treatment plan with patient or surrogate, ordering and review of laboratory studies, ordering and review of radiographic studies, re-evaluation of patient's condition, ordering and performing treatments and interventions and review of old charts   I assumed direction of critical care for this patient from another provider in my specialty: no      Medications Ordered in ED Medications  diphenhydrAMINE (BENADRYL) injection 12.5 mg (has no administration in time range)  albuterol (PROVENTIL) (2.5 MG/3ML) 0.083% nebulizer solution 5 mg (5 mg Nebulization Given 05/21/22 0048)  sodium chloride 0.9 %  bolus 1,000 mL (0 mLs Intravenous Stopped 05/21/22 0158)  diphenhydrAMINE (BENADRYL) injection 25 mg (25 mg Intravenous Given 05/21/22 0055)  famotidine (PEPCID) IVPB 20 mg premix (0 mg Intravenous Stopped 05/21/22 0139)  sodium chloride 0.9 % bolus 1,000 mL (0 mLs Intravenous Stopped 05/21/22 0225)  sodium chloride 0.9 % bolus 500 mL (0 mLs Intravenous Stopped 05/21/22 0246)  potassium chloride SA (KLOR-CON M) CR tablet 40 mEq (40 mEq Oral Given 05/21/22 0413)  potassium chloride 10 mEq in 100 mL IVPB (0 mEq Intravenous Stopped 05/21/22 0523)  lactated ringers bolus 500 mL (0 mLs Intravenous Stopped 05/21/22 0644)  potassium chloride SA (KLOR-CON M) CR tablet 40 mEq (40 mEq Oral Given 05/21/22 0536)  potassium chloride 10 mEq in 100 mL IVPB (0 mEq Intravenous Stopped 05/21/22 0645)  magnesium sulfate  IVPB 2 g 50 mL (0 g Intravenous Stopped 05/21/22 0645)    ED Course/ Medical Decision Making/ A&P Clinical Course as of 05/21/22 0649  Mon May 21, 2022  0102 Patient presents after having allergic reaction.  She suspects food may have been contaminated with shellfish.  She is already received epinephrine.  She took Benadryl at home which she vomited it up.  She is in otherwise no acute distress.  There is no angioedema or stridor.  She reports feeling short of breath but no significant wheezing or distress noted.  We will start with IV fluids, IV Benadryl and Pepcid, and albuterol nebulized treatment.  If there is any worsening she will be given repeat epinephrine dose [DW]  0141 Creatinine(!): 2.25 [DW]  0141 Acute kidney injury noted [DW]  0141 Creatinine(!): 2.25 [DW]  0141 Patient does not recall having any recent diagnosis of kidney disease.  Previous labs from 2021 revealed that her creatinine was normal.  Patient did have vomiting and diarrhea denied reports it was not excessive. [DW]  [phone] Plan to give her 2 L IV fluid and reassess.  Her vital signs are improving and she is in no acute distress [DW]  0217 Overall patient reports that her allergic reaction is improving, she is in no acute distress.She reports her PCP recently did labs which included a BMP and she reports it was normal [DW]  0217 We will recheck her labs [DW]  0425 Patient reports he is feeling improved, she stood up without any significant dizziness she is very hesitant to be admitted.  I advised her that with hypokalemia she would likely need multiple rounds of IV potassium.  Plan will be to replace in the ED and recheck.  If no improvement at that time, will likely need to be admitted [DW]  [phone] Patient now agrees to be admitted.  She will need several rounds of IV potassium and IV fluids [DW]  [phone] She reports she is having mild itching to her neck, but no significant urticaria, no angioedema no distress [DW]  [phone] Suspect  patient may have already been dehydrated and mildly hypokalemic due to her medications prior to this episode. [DW]    Clinical Course User Index [DW] Zadie RhineWickline, Tomothy Eddins, MD                           Medical Decision Making Amount and/or Complexity of Data Reviewed Labs: ordered. Decision-making details documented in ED Course. Radiology: ordered. ECG/medicine tests: ordered.  Risk Prescription drug management. Decision regarding hospitalization.   This patient presents to the ED for concern of allergic reaction, this involves an extensive number of  treatment options, and is a complaint that carries with it a high risk of complications and morbidity.  The differential diagnosis includes but is not limited to urticaria, anaphylaxis  Comorbidities that complicate the patient evaluation: Patient's presentation is complicated by their history of hypertension   Lab Tests: I Ordered, and personally interpreted labs.  The pertinent results include:  acute kidney injury, hypokalemia  Imaging Studies ordered: I ordered imaging studies including X-ray chest   I independently visualized and interpreted imaging which showed no acute findings I agree with the radiologist interpretation  Cardiac Monitoring: The patient was maintained on a cardiac monitor.  I personally viewed and interpreted the cardiac monitor which showed an underlying rhythm of:  sinus rhythm  Medicines ordered and prescription drug management: I ordered medication including Benadryl, IV fluids, Pepcid for allergic reaction Reevaluation of the patient after these medicines showed that the patient    improved   Critical Interventions:  IV fluids, IV/PO potassium  Consultations Obtained: I requested consultation with the admitting physician Dr. Leafy Half , and discussed  findings as well as pertinent plan - they recommend: Admission  Reevaluation: After the interventions noted above, I reevaluated the patient and found  that they have :improved  Complexity of problems addressed: Patient's presentation is most consistent with  acute presentation with potential threat to life or bodily function  Disposition: After consideration of the diagnostic results and the patient's response to treatment,  I feel that the patent would benefit from admission   .           Final Clinical Impression(s) / ED Diagnoses Final diagnoses:  Allergic reaction to food, initial encounter  Dehydration  Hypokalemia  AKI (acute kidney injury) (HCC)  Hypomagnesemia    Rx / DC Orders ED Discharge Orders          Ordered    EPINEPHrine 0.3 mg/0.3 mL IJ SOAJ injection  As needed        05/21/22 0426              Zadie Rhine, MD 05/21/22 [phone]

## 2022-05-21 NOTE — ED Triage Notes (Signed)
Pt to ED via EMS from home. Pt had burger and chips tonight from [employer]. Pt has known hx of allergy to shellfish. Pt took 50 PO of benadryl at home d/t hives. Pt called EMS c/o worsening hives and tightness in throat. [name] airway patent upon arrival to ED. EMS gave . 5 of epi IM and 2.5 of albuterol en route. Pt also became cool, clammy, and diaphoretic en route with EMS. Pt also started c/o N/V/D en route with EMS.   EMS vitals:  20RAC 114/86 70 HR

## 2022-05-21 NOTE — H&P (Addendum)
History and Physical    Patient: Carol CutterClaudia Kathleen Duncan ZOX:[id number] DOB: 26-Jan-1951 DOA: 05/21/2022 DOS: the patient was seen and examined on 05/21/2022 PCP: Marden NobleGates, Robert, MD  Patient coming from: Home via EMS  Chief Complaint:  Chief Complaint  Patient presents with   Allergic Reaction   HPI: Carol CutterClaudia Kathleen Duncan is a 71 y.o. female with medical history significant of HTN, hypothyroidism, gluten intolerance, and known shellfish allergy.  She presents with complaints of hives and throat tightness concerning for allergic reaction.  [facility].  About an hour and half later patient started having itching of her arm.  She had taken Benadryl 50 mg p.o. without significant improvement in symptoms.  She reported having progressively worsening shortness of breath along with feeling of throat tightness for which EMS was called. En route with EMS patient had been given 0.5 mg of epinephrine IM and albuterol breathing treatment.  She reported associated symptoms of nausea, vomiting, diarrhea, and shortness of breath.  She had just recently had lab work with her primary last week and she reviewed the results which were all normal at that time.  The patient believes that the chips had possibly been cooked in the same grease as shellfish.  She denies any recent medication changes.  She had been given doxycycline to use if needed for any prolonged tick bite as she had reported having 4 tick bites in the last year.   Upon admission into the emergency department patient was seen to be afebrile, pulse 70-109, respirations 11-25, blood pressure 90/50 to 122/57, and O2 saturation maintained on room air.  Labs significant for potassium 3.3-> 2.5, BUN 23, creatinine 2.25-> 1.95, and calcium 10.7-> 8.6, and magnesium 1.3.  Chest x-ray revealed low lung volumes with bibasilar atelectasis prominent heart size bronchovascular crowding versus vascular congestion.  Patient  having given total 3 L of IV fluids, total of 90 mEq of potassium chloride, 2 g of magnesium sulfate, Benadryl, Pepcid, and albuterol.  At this time she reports still having some shortness of breath, continued itching, and feels like her throat is getting tight again.  Review of Systems: As mentioned in the history of present illness. All other systems reviewed and are negative. Past Medical History:  Diagnosis Date   Anxiety    Bile duct abnormality    Complication of anesthesia    Takes alot to keep patient under anesthesia   Diverticulitis    Hypertension    Past Surgical History:  Procedure Laterality Date   ABDOMINAL HYSTERECTOMY     APPENDECTOMY     during hysterectomy   CHOLECYSTECTOMY N/A 02/27/2016   Procedure: LAPAROSCOPIC CHOLECYSTECTOMY WITH INTRAOPERATIVE CHOLANGIOGRAM;  Surgeon: Romie LeveeAlicia Bucholz, MD;  Location: [facility] ORS;  Service: General;  Laterality: N/A;   myomectomy     Social History:  reports that she has never smoked. She has never used smokeless tobacco. She reports current alcohol use. She reports that she does not use drugs.  Allergies  Allergen Reactions   Shellfish Allergy     Shrimp ok, Scallops, clams, oysters give angioedema   Wheat Bran Other (See Comments)    Upset stomach    Family History  Problem Relation Age of Onset   Brain cancer Mother    Heart attack Father     Prior to Admission medications   Medication Sig Start Date End Date Taking? Authorizing Provider  ALPRAZolam Prudy Feeler(XANAX) 1 MG tablet Take 0.5 mg by mouth 3 (three) times  daily as needed for anxiety.    Yes [provider]  doxycycline (VIBRAMYCIN) 100 MG capsule Take 100 mg by mouth 2 (two) times daily as needed. 05/16/22  Yes [provider]  EPINEPHrine 0.3 mg/0.3 mL IJ SOAJ injection Inject 0.3 mg into the muscle as needed for anaphylaxis. 05/21/22  Yes Zadie Rhine, MD  estradiol (ESTRACE) 1 MG tablet Take 1 mg by mouth daily. 04/15/21  Yes [provider]   HYDROcodone-acetaminophen (NORCO) 10-325 MG per tablet Take 1-2 tablets by mouth every 6 (six) hours as needed for severe pain (takes one-half tablet).    Yes [provider]  lamoTRIgine (LAMICTAL) 200 MG tablet Take 200 mg by mouth every morning.   Yes [provider]  levothyroxine (SYNTHROID) 50 MCG tablet Take 50 mcg by mouth every morning. 02/12/21  Yes [provider]  naproxen (NAPROSYN) 500 MG tablet Take 500 mg by mouth 2 (two) times daily as needed for mild pain. 03/29/22  Yes [provider]  pregabalin (LYRICA) 75 MG capsule Take 75 mg by mouth 2 (two) times daily.   Yes [provider]  QUEtiapine (SEROQUEL) 25 MG tablet Take 25 mg by mouth at bedtime.   Yes [provider]  tiZANidine (ZANAFLEX) 4 MG tablet Take 4 mg by mouth every 6 (six) hours as needed for muscle spasms.   Yes [provider]  valACYclovir (VALTREX) 500 MG tablet Take 500 mg by mouth daily. As needed for outbreaks 09/23/15  Yes [provider]  valsartan-hydrochlorothiazide (DIOVAN-HCT) 80-12.5 MG tablet Take 1 tablet by mouth daily.   Yes [provider]    Physical Exam: Vitals:   05/21/22 0345 05/21/22 0430 05/21/22 0530 05/21/22 0644  BP: (!) 105/57 113/63 (!) 113/57 (!) 104/57  Pulse: 72 70 80 (!) 109  Resp: 13 12 18 17   Temp:      TempSrc:      SpO2: 97% 99% 98% 98%   Exam  Constitutional: Elderly female who appears to be uncomfortable Eyes: PERRL, lids and conjunctivae normal ENMT: Mucous membranes are dry. Posterior pharynx clear of any exudate or lesions.  Neck: normal, supple, no masses.  No stridor appreciated. Respiratory: Clear to auscultation bilaterally without significant wheezes or rhonchi appreciated.  O2 saturations currently maintained on room air. Cardiovascular: Regular rate and rhythm, no murmurs / rubs / gallops. No extremity edema.  Abdomen: Bowel sounds appreciated. Musculoskeletal: no clubbing /  cyanosis. No joint deformity upper and lower extremities.  Skin: n mild erythema noted of the left arm Neurologic: CN 2-12 grossly intact. Strength 5/5 in all 4.  Psychiatric: Normal judgment and insight. Alert and oriented x 3. Normal mood.   Data Reviewed:  EKG revealed sinus rhythm at 70 bpm with nonspecific intraventricular abnormality Assessment and Plan: Allergic reaction/suspected shellfish allergy Acute. Patient presented with complaints of hives, throat tightness, shortness of breath, nausea, vomiting, and diarrhea after eating burger and chips from .  Patient suspects that the chips may have been fried and contaminated  grease with shellfish.  Patient had been given epinephrine, Benadryl, Pepcid, and IV fluids in the ED. -Admit to a telemetry bed -Aspiration precautions with elevation of head of bed -Prednisone 60 mg p.o. -Pepcid 20 mg twice daily -Benadryl p.o. every 6 hours   -Epinephrine pen x1 dose now and as needed.   -Continue to monitor   Transient hypotension Acute. Blood pressures initially documented as low as 90/50 with MAP 63. Home blood pressure medication regimen  includes valsartan hydrochlorothiazide 80-12.5 mg daily. -Hold blood pressure medications -IV fluids to maintain MAP greater than 65  Acute kidney injury Patient presents with creatinine initially elevated up to 2.25 with BUN 23.  She she is on valsartan-hydrochlorothiazide for blood pressure along with reports of nausea, vomiting, and diarrhea which are likely contributing factors to patient's kidney injury.  Patient has been given 3 L of IV fluids. The  -Monitor intake and output -Check urinalysis -Avoid nephrotoxic agents -Normal saline IV fluids at 75 mL/h  Hypokalemia Acute.  Potassium 3.3-> 2.5 on recheck.  Patient had been given  potassium chloride 90 mEq. -Continue to monitor and replace as needed  Hypomagnesemia Acute.  Magnesium level 1.3.  Patient has been given 2 g of  magnesium sulfate. -Given additional 1 g of magnesium sulfate IV -Continue to monitor and replace as needed  Hypothyroidism Home medication regimen includes levothyroxine 50 mcg daily. -Continue Levothyroxine   Anxiety and depression Home medication regimen includes Xanax 0.5 mg 3 times daily as needed for anxiety, Lamictal 200 mg daily. -Continue current regimen  Advance Care Planning:   Code Status: Full Code  Consults: none  Family Communication: Roommate updated over the phone  Severity of Illness: The appropriate patient status for this patient is OBSERVATION. Observation status is judged to be reasonable and necessary in order to provide the required intensity of service to ensure the patient's safety. The patient's presenting symptoms, physical exam findings, and initial radiographic and laboratory data in the context of their medical condition is felt to place them at decreased risk for further clinical deterioration. Furthermore, it is anticipated that the patient will be medically stable for discharge from the hospital within 2 midnights of admission.   Author: Clydie Braun, MD 05/21/2022 7:09 AM  For on call review [email or web address].

## 2022-05-22 DIAGNOSIS — T782XXA Anaphylactic shock, unspecified, initial encounter: Secondary | ICD-10-CM

## 2022-05-22 DIAGNOSIS — T781XXA Other adverse food reactions, not elsewhere classified, initial encounter: Secondary | ICD-10-CM | POA: Diagnosis not present

## 2022-05-22 DIAGNOSIS — Z91013 Allergy to seafood: Secondary | ICD-10-CM | POA: Diagnosis not present

## 2022-05-22 LAB — CBC
HCT: 29.9 % — ABNORMAL LOW (ref 36.0–46.0)
Hemoglobin: 10.1 g/dL — ABNORMAL LOW (ref 12.0–15.0)
MCH: 30.5 pg (ref 26.0–34.0)
MCHC: 33.8 g/dL (ref 30.0–36.0)
MCV: 90.3 fL (ref 80.0–100.0)
Platelets: 183 10*3/uL (ref 150–400)
RBC: 3.31 MIL/uL — ABNORMAL LOW (ref 3.87–5.11)
RDW: 16.5 % — ABNORMAL HIGH (ref 11.5–15.5)
WBC: 12.7 10*3/uL — ABNORMAL HIGH (ref 4.0–10.5)
nRBC: 0 % (ref 0.0–0.2)

## 2022-05-22 LAB — COMPREHENSIVE METABOLIC PANEL
ALT: 13 U/L (ref 0–44)
AST: 16 U/L (ref 15–41)
Albumin: 2.8 g/dL — ABNORMAL LOW (ref 3.5–5.0)
Alkaline Phosphatase: 27 U/L — ABNORMAL LOW (ref 38–126)
Anion gap: 5 (ref 5–15)
BUN: 18 mg/dL (ref 8–23)
CO2: 20 mmol/L — ABNORMAL LOW (ref 22–32)
Calcium: 9 mg/dL (ref 8.9–10.3)
Chloride: 114 mmol/L — ABNORMAL HIGH (ref 98–111)
Creatinine, Ser: 1.13 mg/dL — ABNORMAL HIGH (ref 0.44–1.00)
GFR, Estimated: 52 mL/min — ABNORMAL LOW (ref >60)
Glucose, Bld: 108 mg/dL — ABNORMAL HIGH (ref 70–99)
Potassium: 4.4 mmol/L (ref 3.5–5.1)
Sodium: 139 mmol/L (ref 135–145)
Total Bilirubin: 0.6 mg/dL (ref 0.3–1.2)
Total Protein: 5.2 g/dL — ABNORMAL LOW (ref 6.5–8.1)

## 2022-05-22 MED ORDER — CETIRIZINE HCL 10 MG PO TABS: 10.0000 mg | ORAL_TABLET | Freq: Every day | ORAL | 0 refills | Status: DC

## 2022-05-22 MED ORDER — SALINE SPRAY 0.65 % NA SOLN
1.0000 | NASAL | Status: DC | PRN
Start: 2022-05-22 — End: 2022-05-22
  Administered 2022-05-22: 1 via NASAL
  Filled 2022-05-22: qty 44

## 2022-05-22 MED ORDER — PREDNISONE 20 MG PO TABS: 40.0000 mg | ORAL_TABLET | Freq: Every day | ORAL | 0 refills | Status: AC

## 2022-05-22 MED ORDER — FAMOTIDINE 20 MG PO TABS: 20.0000 mg | ORAL_TABLET | Freq: Two times a day (BID) | ORAL | 0 refills | Status: DC

## 2022-05-22 MED ORDER — EPINEPHRINE 0.3 MG/0.3ML IJ SOAJ: 0.3000 mg | INTRAMUSCULAR | 1 refills | Status: AC | PRN

## 2022-05-22 NOTE — Assessment & Plan Note (Signed)
Improved with epi, steroids, antihistamines Epi pen prescribed at discharge Will send with Carol Duncan dose of steroids and antihistamines Allergy referral

## 2022-05-22 NOTE — Progress Notes (Signed)
Tylene Fantasia to be D/C'd  per MD order.  Discussed with the patient and all questions fully answered.  VSS, Skin clean, dry and intact without evidence of skin break down, no evidence of skin tears noted.  IV catheter discontinued intact. Site without signs and symptoms of complications. Dressing and pressure applied.  An After Visit Summary was printed and given to the patient.  D/c re-educated completed with patient/family including follow up instructions, medication list, d/c activities limitations if indicated, with other d/c instructions as indicated by MD - patient able to verbalize understanding, all questions fully answered.   Patient instructed to return to ED, call 911, or call MD for any changes in condition.   Patient to be escorted via [place], and D/C home via private auto.

## 2022-05-22 NOTE — Assessment & Plan Note (Signed)
Resolved, likely related to anaphylaxis Resume home BP meds at discharge, needs close follow up with PCP

## 2022-05-22 NOTE — Assessment & Plan Note (Signed)
synthroid °

## 2022-05-22 NOTE — Assessment & Plan Note (Signed)
resolved 

## 2022-05-22 NOTE — Hospital Course (Signed)
Carol Duncan is Carol Duncan 71 y.o. female with medical history significant of HTN, hypothyroidism, gluten intolerance, and known shellfish allergy.  She was admitted with an anaphylactic reaction, AKI, hypokalemia.  She's improved with treatment.  See below for additional details

## 2022-05-22 NOTE — Assessment & Plan Note (Signed)
improved

## 2022-05-22 NOTE — Assessment & Plan Note (Addendum)
Likely hemodynamically mediated, nausea, vomiting, on BP meds Follow outpatient while resuming BP meds

## 2022-05-22 NOTE — Discharge Summary (Signed)
Physician Discharge Summary  Carol CutterClaudia Kathleen Duncan ZOX:[id number] DOB: [birth year] DOA: 05/21/2022  PCP: Marden NobleGates, Robert, MD  Admit date: 05/21/2022 Discharge date: 05/22/2022  Time spent: 34 minutes  Recommendations for Outpatient Follow-up:  Follow outpatient CBC/CMP  Follow BP outpatient, home meds resumed Follow lytes/renal function   Allergy referral   Discharge Diagnoses:  Principal Problem:   Shellfish allergy Active Problems:   Anaphylaxis   Transient hypotension   AKI (acute kidney injury) (HCC)   Hypokalemia   Hypomagnesemia   Hypothyroidism   Discharge Condition: stable  Diet recommendation: heart healthy  There were no vitals filed for this visit.  History of present illness:  Carol CutterClaudia Kathleen Duncan is Carol Duncan 71 y.o. female with medical history significant of HTN, hypothyroidism, gluten intolerance, and known shellfish allergy.  She was admitted with an anaphylactic reaction, AKI, hypokalemia.  She's improved with treatment.  See below for additional details  Hospital Course:  Assessment and Plan: Anaphylaxis Improved with epi, steroids, antihistamines Epi pen prescribed at discharge Will send with Carol Duncan dose of steroids and antihistamines Allergy referral   Transient hypotension Resolved, likely related to anaphylaxis Resume home BP meds at discharge, needs close follow up with PCP  AKI (acute kidney injury) (HCC) Likely hemodynamically mediated, nausea, vomiting, on BP meds Follow outpatient while resuming BP meds  Hypokalemia resolved  Hypomagnesemia improved  Hypothyroidism synthroid          Procedures: none   Consultations: none  Discharge Exam: Vitals:   05/22/22 0451 05/22/22 0838  BP: 116/65 131/72  Pulse: 69 62  Resp: 17 18  Temp: 98.4 F (36.9 C) 98 F (36.7 C)  SpO2: 98% 99%   Feeling better Eager to discharge Discussed d/c recs  General: No acute distress. Cardiovascular: Heart sounds show Carol Duncan regular rate, and  rhythm.  Lungs: Clear to auscultation bilaterally  Abdomen: Soft, nontender, nondistended  Neurological: Alert and oriented 3. Moves all extremities 4 with equal strength. Cranial nerves II through XII grossly intact. Extremities: No clubbing or cyanosis. No edema.   Discharge Instructions   Discharge Instructions     Ambulatory referral to Allergy   Complete by: As directed    Call MD for:  difficulty breathing, headache or visual disturbances   Complete by: As directed    Call MD for:  extreme fatigue   Complete by: As directed    Call MD for:  hives   Complete by: As directed    Call MD for:  persistant dizziness or light-headedness   Complete by: As directed    Call MD for:  persistant nausea and vomiting   Complete by: As directed    Call MD for:  redness, tenderness, or signs of infection (pain, swelling, redness, odor or green/yellow discharge around incision site)   Complete by: As directed    Call MD for:  severe uncontrolled pain   Complete by: As directed    Call MD for:  temperature >100.4   Complete by: As directed    Diet - low sodium heart healthy   Complete by: As directed    Discharge instructions   Complete by: As directed    You were seen for an allergic reaction, anaphylaxis.   You received an epi pen dose, steroids, and antihistamines.  I'll prescribe Carol Duncan dose of steroids for tomorrow.  Continue antihistamines for about 1 week.  Follow with allergy outpatient.  I'll prescribe you with epi pen for use in emergencies/allergic reactions.  Call EMS if you use this.  Carry one in your purse at all times.  Your kidney function has improved.  Your blood pressure has improved.  Resume you home blood pressure meds, but follow up in about 1 week or so to repeat your blood pressure and labs.  Return for new, recurrent, or worsening symptoms.  Please ask your PCP to request records from this hospitalization so they know what was done and what the next steps will  be.   Increase activity slowly   Complete by: As directed       Allergies as of 05/22/2022       Reactions   Shellfish Allergy    Shrimp ok, Scallops, clams, oysters give angioedema   Wheat Bran Other (See Comments)   Upset stomach        Medication List     STOP taking these medications    amLODipine 2.5 MG tablet Commonly known as: NORVASC   cephALEXin 500 MG capsule Commonly known as: Keflex   lithium carbonate 150 MG capsule   ondansetron 8 MG tablet Commonly known as: ZOFRAN       TAKE these medications    ALPRAZolam 1 MG tablet Commonly known as: XANAX Take 0.5 mg by mouth 3 (three) times daily as needed for anxiety.   cetirizine 10 MG tablet Commonly known as: ZyrTEC Allergy Take 1 tablet (10 mg total) by mouth daily for 7 days.   doxycycline 100 MG capsule Commonly known as: VIBRAMYCIN Take 100 mg by mouth 2 (two) times daily as needed.   EPINEPHrine 0.3 mg/0.3 mL Soaj injection Commonly known as: EPI-PEN Inject 0.3 mg into the muscle as needed for anaphylaxis.   EPINEPHrine 0.3 mg/0.3 mL Soaj injection Commonly known as: EPI-PEN Inject 0.3 mg into the muscle as needed for anaphylaxis. Call EMS if you use this.   estradiol 1 MG tablet Commonly known as: ESTRACE Take 1 mg by mouth daily.   famotidine 20 MG tablet Commonly known as: PEPCID Take 1 tablet (20 mg total) by mouth 2 (two) times daily for 7 days.   HYDROcodone-acetaminophen 10-325 MG tablet Commonly known as: NORCO Take 1-2 tablets by mouth every 6 (six) hours as needed for severe pain (takes one-half tablet).   lamoTRIgine 200 MG tablet Commonly known as: LAMICTAL Take 200 mg by mouth every morning.   levothyroxine 50 MCG tablet Commonly known as: SYNTHROID Take 50 mcg by mouth every morning.   naproxen 500 MG tablet Commonly known as: NAPROSYN Take 500 mg by mouth 2 (two) times daily as needed for mild pain.   predniSONE 20 MG tablet Commonly known as:  DELTASONE Take 2 tablets (40 mg total) by mouth daily with breakfast for 1 day. Start taking on: May 23, 2022   pregabalin 75 MG capsule Commonly known as: LYRICA Take 75 mg by mouth 2 (two) times daily.   QUEtiapine 25 MG tablet Commonly known as: SEROQUEL Take 25 mg by mouth at bedtime.   tiZANidine 4 MG tablet Commonly known as: ZANAFLEX Take 4 mg by mouth every 6 (six) hours as needed for muscle spasms.   valACYclovir 500 MG tablet Commonly known as: VALTREX Take 500 mg by mouth daily. As needed for outbreaks   valsartan-hydrochlorothiazide 80-12.5 MG tablet Commonly known as: DIOVAN-HCT Take 1 tablet by mouth daily.       Allergies  Allergen Reactions   Shellfish Allergy     Shrimp ok, Scallops, clams, oysters give angioedema   Wheat Bran Other (See Comments)    Upset  stomach      The results of significant diagnostics from this hospitalization (including imaging, microbiology, ancillary and laboratory) are listed below for reference.    Significant Diagnostic Studies: DG Chest Port 1 View  Result Date: 05/21/2022 CLINICAL DATA:  Shortness of breath. EXAM: PORTABLE CHEST 1 VIEW COMPARISON:  Radiograph 05/03/2017 FINDINGS: Lung volumes are low. The heart is prominent in size. Bibasilar atelectasis. Bronchovascular crowding related to low lung volumes versus mild vascular congestion. No pneumothorax or pleural effusion. Remote right clavicle fracture. IMPRESSION: 1. Low lung volumes with bibasilar atelectasis. 2. Prominent heart size, may be accentuated by technique and low lung volumes. Bronchovascular crowding versus vascular congestion. Electronically Signed   By: Narda Rutherford M.D.   On: 05/21/2022 01:14   DG Foot 2 Views Right  Result Date: 05/07/2022 Please see detailed radiograph report in office note.   Microbiology: No results found for this or any previous visit (from the past 240 hour(s)).   Labs: Basic Metabolic Panel: Recent Labs  Lab  05/21/22 0042 05/21/22 0237 05/21/22 2209 05/22/22 0120  NA 138 143 134* 139  K 3.3* 2.5* 4.3 4.4  CL 102 112* 107 114*  CO2 23 22 22  20*  GLUCOSE 133* 136* 146* 108*  BUN 23 22 18 18   CREATININE 2.25* 1.95* 1.26* 1.13*  CALCIUM 10.7* 8.6* 8.6* 9.0  MG  --  1.3* 1.9  --    Liver Function Tests: Recent Labs  Lab 05/22/22 0120  AST 16  ALT 13  ALKPHOS 27*  BILITOT 0.6  PROT 5.2*  ALBUMIN 2.8*   No results for input(s): LIPASE, AMYLASE in the last 168 hours. No results for input(s): AMMONIA in the last 168 hours. CBC: Recent Labs  Lab 05/21/22 0042 05/22/22 0120  WBC 6.1 12.7*  HGB 13.9 10.1*  HCT 42.0 29.9*  MCV 90.5 90.3  PLT 188 183   Cardiac Enzymes: No results for input(s): CKTOTAL, CKMB, CKMBINDEX, TROPONINI in the last 168 hours. BNP: BNP (last 3 results) No results for input(s): BNP in the last 8760 hours.  ProBNP (last 3 results) No results for input(s): PROBNP in the last 8760 hours.  CBG: No results for input(s): GLUCAP in the last 168 hours.     Signed:  07/21/22 MD.  [facility] 05/22/2022, 9:37 AM

## 2022-05-23 DIAGNOSIS — F319 Bipolar disorder, unspecified: Secondary | ICD-10-CM | POA: Diagnosis not present

## 2022-05-23 DIAGNOSIS — F411 Generalized anxiety disorder: Secondary | ICD-10-CM | POA: Diagnosis not present

## 2022-05-23 LAB — CALCIUM, IONIZED: Calcium, Ionized, Serum: 5.4 mg/dL (ref 4.5–5.6)

## 2022-05-29 DIAGNOSIS — M48062 Spinal stenosis, lumbar region with neurogenic claudication: Secondary | ICD-10-CM | POA: Diagnosis not present

## 2022-05-29 DIAGNOSIS — M4326 Fusion of spine, lumbar region: Secondary | ICD-10-CM | POA: Diagnosis not present

## 2022-05-29 DIAGNOSIS — M79604 Pain in right leg: Secondary | ICD-10-CM | POA: Diagnosis not present

## 2022-06-13 DIAGNOSIS — M25561 Pain in right knee: Secondary | ICD-10-CM | POA: Diagnosis not present

## 2022-06-13 DIAGNOSIS — T7802XA Anaphylactic reaction due to shellfish (crustaceans), initial encounter: Secondary | ICD-10-CM | POA: Diagnosis not present

## 2022-06-13 DIAGNOSIS — M25562 Pain in left knee: Secondary | ICD-10-CM | POA: Diagnosis not present

## 2022-06-28 DIAGNOSIS — M545 Low back pain, unspecified: Secondary | ICD-10-CM | POA: Diagnosis not present

## 2022-06-28 DIAGNOSIS — M48062 Spinal stenosis, lumbar region with neurogenic claudication: Secondary | ICD-10-CM | POA: Diagnosis not present

## 2022-06-28 DIAGNOSIS — M79604 Pain in right leg: Secondary | ICD-10-CM | POA: Diagnosis not present

## 2022-06-28 DIAGNOSIS — M4326 Fusion of spine, lumbar region: Secondary | ICD-10-CM | POA: Diagnosis not present

## 2022-07-10 DIAGNOSIS — M48062 Spinal stenosis, lumbar region with neurogenic claudication: Secondary | ICD-10-CM | POA: Diagnosis not present

## 2022-07-18 DIAGNOSIS — F319 Bipolar disorder, unspecified: Secondary | ICD-10-CM | POA: Diagnosis not present

## 2022-07-18 DIAGNOSIS — F411 Generalized anxiety disorder: Secondary | ICD-10-CM | POA: Diagnosis not present

## 2022-07-26 DIAGNOSIS — M79604 Pain in right leg: Secondary | ICD-10-CM | POA: Diagnosis not present

## 2022-07-26 DIAGNOSIS — M4326 Fusion of spine, lumbar region: Secondary | ICD-10-CM | POA: Diagnosis not present

## 2022-08-13 DIAGNOSIS — Z79890 Hormone replacement therapy (postmenopausal): Secondary | ICD-10-CM | POA: Diagnosis not present

## 2022-08-13 DIAGNOSIS — W57XXXA Bitten or stung by nonvenomous insect and other nonvenomous arthropods, initial encounter: Secondary | ICD-10-CM | POA: Diagnosis not present

## 2022-08-13 DIAGNOSIS — I10 Essential (primary) hypertension: Secondary | ICD-10-CM | POA: Diagnosis not present

## 2022-08-13 DIAGNOSIS — R319 Hematuria, unspecified: Secondary | ICD-10-CM | POA: Diagnosis not present

## 2022-08-13 DIAGNOSIS — T7802XA Anaphylactic reaction due to shellfish (crustaceans), initial encounter: Secondary | ICD-10-CM | POA: Diagnosis not present

## 2022-08-13 DIAGNOSIS — L539 Erythematous condition, unspecified: Secondary | ICD-10-CM | POA: Diagnosis not present

## 2022-08-13 DIAGNOSIS — B001 Herpesviral vesicular dermatitis: Secondary | ICD-10-CM | POA: Diagnosis not present

## 2022-08-13 DIAGNOSIS — M48061 Spinal stenosis, lumbar region without neurogenic claudication: Secondary | ICD-10-CM | POA: Diagnosis not present

## 2022-08-13 DIAGNOSIS — E039 Hypothyroidism, unspecified: Secondary | ICD-10-CM | POA: Diagnosis not present

## 2022-08-13 DIAGNOSIS — F322 Major depressive disorder, single episode, severe without psychotic features: Secondary | ICD-10-CM | POA: Diagnosis not present

## 2022-08-13 DIAGNOSIS — R41840 Attention and concentration deficit: Secondary | ICD-10-CM | POA: Diagnosis not present

## 2022-08-27 DIAGNOSIS — Z79891 Long term (current) use of opiate analgesic: Secondary | ICD-10-CM | POA: Diagnosis not present

## 2022-08-27 DIAGNOSIS — G894 Chronic pain syndrome: Secondary | ICD-10-CM | POA: Diagnosis not present

## 2022-08-27 DIAGNOSIS — M79604 Pain in right leg: Secondary | ICD-10-CM | POA: Diagnosis not present

## 2022-08-27 DIAGNOSIS — Z79899 Other long term (current) drug therapy: Secondary | ICD-10-CM | POA: Diagnosis not present

## 2022-08-27 DIAGNOSIS — M4326 Fusion of spine, lumbar region: Secondary | ICD-10-CM | POA: Diagnosis not present

## 2022-09-10 DIAGNOSIS — F411 Generalized anxiety disorder: Secondary | ICD-10-CM | POA: Diagnosis not present

## 2022-09-10 DIAGNOSIS — F319 Bipolar disorder, unspecified: Secondary | ICD-10-CM | POA: Diagnosis not present

## 2022-09-25 DIAGNOSIS — M48062 Spinal stenosis, lumbar region with neurogenic claudication: Secondary | ICD-10-CM | POA: Diagnosis not present

## 2022-10-16 DIAGNOSIS — F3181 Bipolar II disorder: Secondary | ICD-10-CM | POA: Diagnosis not present

## 2022-10-16 DIAGNOSIS — Z91018 Allergy to other foods: Secondary | ICD-10-CM | POA: Diagnosis not present

## 2022-10-16 DIAGNOSIS — Z23 Encounter for immunization: Secondary | ICD-10-CM | POA: Diagnosis not present

## 2022-10-23 DIAGNOSIS — M79604 Pain in right leg: Secondary | ICD-10-CM | POA: Diagnosis not present

## 2022-10-23 DIAGNOSIS — M4326 Fusion of spine, lumbar region: Secondary | ICD-10-CM | POA: Diagnosis not present

## 2022-10-26 DIAGNOSIS — H2513 Age-related nuclear cataract, bilateral: Secondary | ICD-10-CM | POA: Diagnosis not present

## 2022-10-26 DIAGNOSIS — H524 Presbyopia: Secondary | ICD-10-CM | POA: Diagnosis not present

## 2022-11-01 DIAGNOSIS — M5459 Other low back pain: Secondary | ICD-10-CM | POA: Diagnosis not present

## 2022-11-01 DIAGNOSIS — M25551 Pain in right hip: Secondary | ICD-10-CM | POA: Diagnosis not present

## 2022-11-12 DIAGNOSIS — F411 Generalized anxiety disorder: Secondary | ICD-10-CM | POA: Diagnosis not present

## 2022-11-12 DIAGNOSIS — F319 Bipolar disorder, unspecified: Secondary | ICD-10-CM | POA: Diagnosis not present

## 2022-11-15 DIAGNOSIS — F3181 Bipolar II disorder: Secondary | ICD-10-CM | POA: Diagnosis not present

## 2022-11-16 DIAGNOSIS — M5459 Other low back pain: Secondary | ICD-10-CM | POA: Diagnosis not present

## 2022-11-16 DIAGNOSIS — M25551 Pain in right hip: Secondary | ICD-10-CM | POA: Diagnosis not present

## 2022-11-20 DIAGNOSIS — M5459 Other low back pain: Secondary | ICD-10-CM | POA: Diagnosis not present

## 2022-11-20 DIAGNOSIS — M25551 Pain in right hip: Secondary | ICD-10-CM | POA: Diagnosis not present

## 2022-11-21 LAB — EXTERNAL GENERIC LAB PROCEDURE

## 2022-11-21 LAB — COLOGUARD

## 2022-11-22 DIAGNOSIS — M5459 Other low back pain: Secondary | ICD-10-CM | POA: Diagnosis not present

## 2022-11-22 DIAGNOSIS — M25551 Pain in right hip: Secondary | ICD-10-CM | POA: Diagnosis not present

## 2022-11-26 DIAGNOSIS — M5459 Other low back pain: Secondary | ICD-10-CM | POA: Diagnosis not present

## 2022-11-26 DIAGNOSIS — M25551 Pain in right hip: Secondary | ICD-10-CM | POA: Diagnosis not present

## 2022-11-27 DIAGNOSIS — M79604 Pain in right leg: Secondary | ICD-10-CM | POA: Diagnosis not present

## 2022-11-27 DIAGNOSIS — S39012S Strain of muscle, fascia and tendon of lower back, sequela: Secondary | ICD-10-CM | POA: Diagnosis not present

## 2022-11-27 DIAGNOSIS — M5416 Radiculopathy, lumbar region: Secondary | ICD-10-CM | POA: Diagnosis not present

## 2022-11-27 DIAGNOSIS — M4326 Fusion of spine, lumbar region: Secondary | ICD-10-CM | POA: Diagnosis not present

## 2022-11-29 DIAGNOSIS — F319 Bipolar disorder, unspecified: Secondary | ICD-10-CM | POA: Diagnosis not present

## 2022-11-29 DIAGNOSIS — I10 Essential (primary) hypertension: Secondary | ICD-10-CM | POA: Diagnosis not present

## 2022-11-29 DIAGNOSIS — E7800 Pure hypercholesterolemia, unspecified: Secondary | ICD-10-CM | POA: Diagnosis not present

## 2022-11-29 DIAGNOSIS — E039 Hypothyroidism, unspecified: Secondary | ICD-10-CM | POA: Diagnosis not present

## 2022-11-29 DIAGNOSIS — Z1331 Encounter for screening for depression: Secondary | ICD-10-CM | POA: Diagnosis not present

## 2022-11-29 DIAGNOSIS — R41840 Attention and concentration deficit: Secondary | ICD-10-CM | POA: Diagnosis not present

## 2022-11-29 DIAGNOSIS — Z79899 Other long term (current) drug therapy: Secondary | ICD-10-CM | POA: Diagnosis not present

## 2022-11-29 DIAGNOSIS — Z0000 Encounter for general adult medical examination without abnormal findings: Secondary | ICD-10-CM | POA: Diagnosis not present

## 2022-11-29 DIAGNOSIS — M47812 Spondylosis without myelopathy or radiculopathy, cervical region: Secondary | ICD-10-CM | POA: Diagnosis not present

## 2022-11-29 DIAGNOSIS — Z91018 Allergy to other foods: Secondary | ICD-10-CM | POA: Diagnosis not present

## 2022-11-29 DIAGNOSIS — I700 Atherosclerosis of aorta: Secondary | ICD-10-CM | POA: Diagnosis not present

## 2022-11-29 DIAGNOSIS — G894 Chronic pain syndrome: Secondary | ICD-10-CM | POA: Diagnosis not present

## 2022-11-30 DIAGNOSIS — M25551 Pain in right hip: Secondary | ICD-10-CM | POA: Diagnosis not present

## 2022-11-30 DIAGNOSIS — M5459 Other low back pain: Secondary | ICD-10-CM | POA: Diagnosis not present

## 2022-12-05 DIAGNOSIS — H2512 Age-related nuclear cataract, left eye: Secondary | ICD-10-CM | POA: Diagnosis not present

## 2022-12-05 DIAGNOSIS — H269 Unspecified cataract: Secondary | ICD-10-CM | POA: Diagnosis not present

## 2022-12-27 DIAGNOSIS — M79604 Pain in right leg: Secondary | ICD-10-CM | POA: Diagnosis not present

## 2022-12-27 DIAGNOSIS — M4326 Fusion of spine, lumbar region: Secondary | ICD-10-CM | POA: Diagnosis not present

## 2022-12-27 DIAGNOSIS — S39012S Strain of muscle, fascia and tendon of lower back, sequela: Secondary | ICD-10-CM | POA: Diagnosis not present

## 2022-12-27 DIAGNOSIS — M5416 Radiculopathy, lumbar region: Secondary | ICD-10-CM | POA: Diagnosis not present

## 2023-01-07 DIAGNOSIS — F319 Bipolar disorder, unspecified: Secondary | ICD-10-CM | POA: Diagnosis not present

## 2023-01-07 DIAGNOSIS — F411 Generalized anxiety disorder: Secondary | ICD-10-CM | POA: Diagnosis not present

## 2023-01-16 DIAGNOSIS — F3181 Bipolar II disorder: Secondary | ICD-10-CM | POA: Diagnosis not present

## 2023-01-23 DIAGNOSIS — H269 Unspecified cataract: Secondary | ICD-10-CM | POA: Diagnosis not present

## 2023-01-23 DIAGNOSIS — H2511 Age-related nuclear cataract, right eye: Secondary | ICD-10-CM | POA: Diagnosis not present

## 2023-01-24 ENCOUNTER — Encounter: Payer: Self-pay | Admitting: Adult Health

## 2023-01-24 ENCOUNTER — Ambulatory Visit (INDEPENDENT_AMBULATORY_CARE_PROVIDER_SITE_OTHER): Payer: Medicare Other | Admitting: Adult Health

## 2023-01-24 VITALS — BP 120/84 | HR 70 | Ht 61.0 in | Wt 147.0 lb

## 2023-01-24 DIAGNOSIS — F411 Generalized anxiety disorder: Secondary | ICD-10-CM | POA: Diagnosis not present

## 2023-01-24 DIAGNOSIS — F331 Major depressive disorder, recurrent, moderate: Secondary | ICD-10-CM | POA: Diagnosis not present

## 2023-01-24 NOTE — Progress Notes (Signed)
[facility] MD/PA/NP Initial Note  01/24/2023 2:10 PM Carol Duncan  MRN:  [id number]  Chief Complaint:   HPI:   Patient seen today for initial psychiatric evaluation.  Session 1 of 2.  Currently seen at [facility].  Describes mood today as "ok". Pleasant. Denies tearfulness. Mood symptoms - reports depression and anxiety. Denies irritability. Reports worry, rumination, and over thinking. Denies obsessive thoughts and acts. Mood is lower. Stating "I'm in pain all the time". Currently seeing Dr. Barbie Banner at the [facility] for mood management. Stable interest and motivation. Taking medications as prescribed.  Energy levels stable. Active, has a regular exercise routine. Walking dog daily.  Enjoys some usual interests and activities. Divorced. Lives with a roommate. Spending time with family. Appetite adequate. Weight stable - needs to lose 15 pounds. Sleeps well most nights - "once I get to sleep". Averages 7 to 8 hours. Focus and concentration stable. Completing tasks. Managing aspects of household. Retired Denies SI or HI.  Denies AH or VH. Denies self harm. Denies substance use.   Previous medication trials: Wellbutrin, Lithium,others  Visit Diagnosis:    ICD-10-CM   1. Major depressive disorder, recurrent episode, moderate (HCC)  F33.1     2. Generalized anxiety disorder  F41.1       Past Psychiatric History: Denies psychiatric hospitalization.   Past Medical History:  Past Medical History:  Diagnosis Date   Anxiety    Bile duct abnormality    Complication of anesthesia    Takes alot to keep patient under anesthesia   Diverticulitis    Hypertension     Past Surgical History:  Procedure Laterality Date   ABDOMINAL HYSTERECTOMY     APPENDECTOMY     during hysterectomy   CHOLECYSTECTOMY N/A 02/27/2016   Procedure: LAPAROSCOPIC CHOLECYSTECTOMY WITH INTRAOPERATIVE CHOLANGIOGRAM;  Surgeon: Leighton Ruff, MD;  Location: [facility];  Service:  General;  Laterality: N/A;   myomectomy      Family Psychiatric History: Family history of mental illness.   Family History:  Family History  Problem Relation Age of Onset   Brain cancer Mother    Heart attack Father     Social History:  Social History   Socioeconomic History   Marital status: Single    Spouse name: Not on file   Number of children: Not on file   Years of education: Not on file   Highest education level: Not on file  Occupational History   Not on file  Tobacco Use   Smoking status: Never   Smokeless tobacco: Never  Vaping Use   Vaping Use: Never used  Substance and Sexual Activity   Alcohol use: Yes    Comment: very rare   Drug use: No   Sexual activity: Yes  Other Topics Concern   Not on file  Social History Narrative   Not on file   Social Determinants of Health   Financial Resource Strain: Not on file  Food Insecurity: Not on file  Transportation Needs: Not on file  Physical Activity: Not on file  Stress: Not on file  Social Connections: Not on file    Allergies:  Allergies  Allergen Reactions   Shellfish Allergy     Shrimp ok, Scallops, clams, oysters give angioedema   Wheat Bran Other (See Comments)    Upset stomach    Metabolic Disorder Labs: No results found for: "HGBA1C", "MPG" No results found for: "PROLACTIN" No results found for: "CHOL", "TRIG", "HDL", "CHOLHDL", "VLDL", "LDLCALC" No  results found for: "TSH"  Therapeutic Level Labs: Lab Results  Component Value Date   LITHIUM <0.06 (L) 02/26/2016   No results found for: "VALPROATE" No results found for: "CBMZ"  Current Medications: Current Outpatient Medications  Medication Sig Dispense Refill   ALPRAZolam (XANAX) 1 MG tablet Take 0.5 mg by mouth 3 (three) times daily as needed for anxiety.      cetirizine (ZYRTEC ALLERGY) 10 MG tablet Take 1 tablet (10 mg total) by mouth daily for 7 days. 7 tablet 0   doxycycline (VIBRAMYCIN) 100 MG capsule Take 100 mg by mouth  2 (two) times daily as needed.     EPINEPHrine 0.3 mg/0.3 mL IJ SOAJ injection Inject 0.3 mg into the muscle as needed for anaphylaxis. 1 each 0   EPINEPHrine 0.3 mg/0.3 mL IJ SOAJ injection Inject 0.3 mg into the muscle as needed for anaphylaxis. Call EMS if you use this. 2 each 1   estradiol (ESTRACE) 1 MG tablet Take 1 mg by mouth daily.     famotidine (PEPCID) 20 MG tablet Take 1 tablet (20 mg total) by mouth 2 (two) times daily for 7 days. 14 tablet 0   HYDROcodone-acetaminophen (NORCO) 10-325 MG per tablet Take 1-2 tablets by mouth every 6 (six) hours as needed for severe pain (takes one-half tablet).      lamoTRIgine (LAMICTAL) 200 MG tablet Take 200 mg by mouth every morning.     levothyroxine (SYNTHROID) 50 MCG tablet Take 50 mcg by mouth every morning.     naproxen (NAPROSYN) 500 MG tablet Take 500 mg by mouth 2 (two) times daily as needed for mild pain.     pregabalin (LYRICA) 75 MG capsule Take 75 mg by mouth 2 (two) times daily.     QUEtiapine (SEROQUEL) 25 MG tablet Take 25 mg by mouth at bedtime.     tiZANidine (ZANAFLEX) 4 MG tablet Take 4 mg by mouth every 6 (six) hours as needed for muscle spasms.     valACYclovir (VALTREX) 500 MG tablet Take 500 mg by mouth daily. As needed for outbreaks     valsartan-hydrochlorothiazide (DIOVAN-HCT) 80-12.5 MG tablet Take 1 tablet by mouth daily.     No current facility-administered medications for this visit.    Medication Side Effects: none  Orders placed this visit:  No orders of the defined types were placed in this encounter.   Psychiatric Specialty Exam:  Review of Systems  Musculoskeletal:  Negative for gait problem.  Neurological:  Negative for tremors.  Psychiatric/Behavioral:         Please refer to HPI    Blood pressure 120/84, pulse 70, height 5\' 1"  (1.549 m), weight 147 lb (66.7 kg).Body mass index is 27.78 kg/m.  General Appearance: Casual and Neat  Eye Contact:  Good  Speech:  Clear and Coherent and Normal Rate   Volume:  Normal  Mood:  Euthymic  Affect:  Appropriate and Congruent  Thought Process:  Coherent and Descriptions of Associations: Intact  Orientation:  Full (Time, Place, and Person)  Thought Content: Logical   Suicidal Thoughts:  No  Homicidal Thoughts:  No  Memory:  WNL  Judgement:  Good  Insight:  Good  Psychomotor Activity:  Normal  Concentration:  Concentration: Good and Attention Span: Good  Recall:  Good  Fund of Knowledge: Good  Language: Good  Assets:  Communication Skills Desire for Improvement Financial Resources/Insurance Housing Intimacy Leisure Time Physical Health Resilience Social Support Talents/Skills Transportation Vocational/Educational  ADL's:  Intact  Cognition:  WNL  Prognosis:  Good   Screenings:  Flowsheet Row ED to Hosp-Admission (Discharged) from 05/21/2022 in [facility] CATEGORY No Risk       Receiving Psychotherapy: No   Treatment Plan/Recommendations:   Plan:  PDMP reviewed  Lamictal 150mg  BID Seroquel 25mg  at bedtime Xanax 1.5mg  daily - has taken for several years.   Previously seen at the [facility]  Time spent with patient was 60 minutes. Greater than 50% of face to face time with patient was spent on counseling and coordination of care.    RTC 4 weeks  Patient advised to contact office with any questions, adverse effects, or acute worsening in signs and symptoms.    Aloha Gell, NP

## 2023-02-04 DIAGNOSIS — D649 Anemia, unspecified: Secondary | ICD-10-CM | POA: Diagnosis not present

## 2023-02-05 DIAGNOSIS — M79604 Pain in right leg: Secondary | ICD-10-CM | POA: Diagnosis not present

## 2023-02-05 DIAGNOSIS — S39012S Strain of muscle, fascia and tendon of lower back, sequela: Secondary | ICD-10-CM | POA: Diagnosis not present

## 2023-02-05 DIAGNOSIS — M5416 Radiculopathy, lumbar region: Secondary | ICD-10-CM | POA: Diagnosis not present

## 2023-02-05 DIAGNOSIS — M4326 Fusion of spine, lumbar region: Secondary | ICD-10-CM | POA: Diagnosis not present

## 2023-02-07 DIAGNOSIS — M25551 Pain in right hip: Secondary | ICD-10-CM | POA: Diagnosis not present

## 2023-02-07 DIAGNOSIS — M6281 Muscle weakness (generalized): Secondary | ICD-10-CM | POA: Diagnosis not present

## 2023-02-07 DIAGNOSIS — M5459 Other low back pain: Secondary | ICD-10-CM | POA: Diagnosis not present

## 2023-02-12 DIAGNOSIS — M5459 Other low back pain: Secondary | ICD-10-CM | POA: Diagnosis not present

## 2023-02-12 DIAGNOSIS — M6281 Muscle weakness (generalized): Secondary | ICD-10-CM | POA: Diagnosis not present

## 2023-02-12 DIAGNOSIS — M25551 Pain in right hip: Secondary | ICD-10-CM | POA: Diagnosis not present

## 2023-02-19 DIAGNOSIS — M5459 Other low back pain: Secondary | ICD-10-CM | POA: Diagnosis not present

## 2023-02-19 DIAGNOSIS — M6281 Muscle weakness (generalized): Secondary | ICD-10-CM | POA: Diagnosis not present

## 2023-02-19 DIAGNOSIS — M25551 Pain in right hip: Secondary | ICD-10-CM | POA: Diagnosis not present

## 2023-02-21 DIAGNOSIS — M6281 Muscle weakness (generalized): Secondary | ICD-10-CM | POA: Diagnosis not present

## 2023-02-21 DIAGNOSIS — M5459 Other low back pain: Secondary | ICD-10-CM | POA: Diagnosis not present

## 2023-02-21 DIAGNOSIS — M25551 Pain in right hip: Secondary | ICD-10-CM | POA: Diagnosis not present

## 2023-02-22 ENCOUNTER — Ambulatory Visit: Payer: Medicare Other | Admitting: Adult Health

## 2023-02-26 DIAGNOSIS — M5459 Other low back pain: Secondary | ICD-10-CM | POA: Diagnosis not present

## 2023-02-26 DIAGNOSIS — M6281 Muscle weakness (generalized): Secondary | ICD-10-CM | POA: Diagnosis not present

## 2023-02-26 DIAGNOSIS — M25551 Pain in right hip: Secondary | ICD-10-CM | POA: Diagnosis not present

## 2023-02-28 DIAGNOSIS — M6281 Muscle weakness (generalized): Secondary | ICD-10-CM | POA: Diagnosis not present

## 2023-02-28 DIAGNOSIS — Z961 Presence of intraocular lens: Secondary | ICD-10-CM | POA: Diagnosis not present

## 2023-02-28 DIAGNOSIS — M25551 Pain in right hip: Secondary | ICD-10-CM | POA: Diagnosis not present

## 2023-02-28 DIAGNOSIS — M5459 Other low back pain: Secondary | ICD-10-CM | POA: Diagnosis not present

## 2023-02-28 DIAGNOSIS — H5201 Hypermetropia, right eye: Secondary | ICD-10-CM | POA: Diagnosis not present

## 2023-03-04 DIAGNOSIS — F411 Generalized anxiety disorder: Secondary | ICD-10-CM | POA: Diagnosis not present

## 2023-03-04 DIAGNOSIS — F319 Bipolar disorder, unspecified: Secondary | ICD-10-CM | POA: Diagnosis not present

## 2023-03-05 DIAGNOSIS — M5459 Other low back pain: Secondary | ICD-10-CM | POA: Diagnosis not present

## 2023-03-05 DIAGNOSIS — M6281 Muscle weakness (generalized): Secondary | ICD-10-CM | POA: Diagnosis not present

## 2023-03-05 DIAGNOSIS — M25551 Pain in right hip: Secondary | ICD-10-CM | POA: Diagnosis not present

## 2023-03-07 DIAGNOSIS — M5459 Other low back pain: Secondary | ICD-10-CM | POA: Diagnosis not present

## 2023-03-07 DIAGNOSIS — M6281 Muscle weakness (generalized): Secondary | ICD-10-CM | POA: Diagnosis not present

## 2023-03-07 DIAGNOSIS — M25551 Pain in right hip: Secondary | ICD-10-CM | POA: Diagnosis not present

## 2023-03-12 ENCOUNTER — Encounter: Payer: Self-pay | Admitting: Adult Health

## 2023-03-12 ENCOUNTER — Ambulatory Visit (INDEPENDENT_AMBULATORY_CARE_PROVIDER_SITE_OTHER): Payer: Medicare Other | Admitting: Adult Health

## 2023-03-12 DIAGNOSIS — F411 Generalized anxiety disorder: Secondary | ICD-10-CM

## 2023-03-12 DIAGNOSIS — M6281 Muscle weakness (generalized): Secondary | ICD-10-CM | POA: Diagnosis not present

## 2023-03-12 DIAGNOSIS — F331 Major depressive disorder, recurrent, moderate: Secondary | ICD-10-CM

## 2023-03-12 DIAGNOSIS — M5459 Other low back pain: Secondary | ICD-10-CM | POA: Diagnosis not present

## 2023-03-12 DIAGNOSIS — M25551 Pain in right hip: Secondary | ICD-10-CM | POA: Diagnosis not present

## 2023-03-12 MED ORDER — ALPRAZOLAM 1 MG PO TABS: 1.0000 mg | ORAL_TABLET | Freq: Two times a day (BID) | ORAL | 2 refills | Status: DC | PRN

## 2023-03-12 MED ORDER — QUETIAPINE FUMARATE 50 MG PO TABS: ORAL_TABLET | ORAL | 5 refills | Status: DC

## 2023-03-12 MED ORDER — LAMOTRIGINE 150 MG PO TABS: 150.0000 mg | ORAL_TABLET | Freq: Two times a day (BID) | ORAL | 5 refills | Status: DC

## 2023-03-12 NOTE — Progress Notes (Signed)
Kiiara Tarrence [id number] Dec 10, 1951 72 y.o.  Subjective:   Patient ID:  Carol Duncan is a 72 y.o. (DOB 1951-03-03) female.  Chief Complaint: No chief complaint on file.   HPI Carol Duncan presents to the office today for follow-up of medication management for MDD and GAD.   Session 2 of 2.  Currently seen at [facility].  Describes mood today as "ok". Pleasant. Denies tearfulness. Mood symptoms - reports depression and anxiety. Denies irritability. Reports worry, rumination, and over thinking. Denies obsessive thoughts and acts. Mood is stable. Stating "I'm actively working on myself". Currently seeing Dr. Barbie Banner at the [facility] for mood management. Stable interest and motivation. Taking medications as prescribed.  Energy levels stable. Active, has a regular exercise routine. Walking dog daily - labrodoole.  Enjoys some usual interests and activities. Divorced. Lives with a roommate. Spending time with family. Appetite adequate - eliminated sugar. Weight loss. Sleeps well most nights. Averages 7 to 8 hours. Focus and concentration stable. Completing tasks. Managing aspects of household. Retired Denies SI or HI.  Denies AH or VH. Denies self harm. Denies substance use.   Previous medication trials: Wellbutrin, Lithium,others  Flowsheet Row ED to Hosp-Admission (Discharged) from 05/21/2022 in [facility] CATEGORY No Risk       Review of Systems:  Review of Systems  Musculoskeletal:  Negative for gait problem.  Neurological:  Negative for tremors.  Psychiatric/Behavioral:         Please refer to HPI    Medications: I have reviewed the patient's current medications.  Current Outpatient Medications  Medication Sig Dispense Refill   ALPRAZolam (XANAX) 1 MG tablet Take 1 tablet (1 mg total) by mouth 2 (two) times daily as needed for anxiety. 60 tablet 2    cetirizine (ZYRTEC ALLERGY) 10 MG tablet Take 1 tablet (10 mg total) by mouth daily for 7 days. 7 tablet 0   doxycycline (VIBRAMYCIN) 100 MG capsule Take 100 mg by mouth 2 (two) times daily as needed.     EPINEPHrine 0.3 mg/0.3 mL IJ SOAJ injection Inject 0.3 mg into the muscle as needed for anaphylaxis. 1 each 0   EPINEPHrine 0.3 mg/0.3 mL IJ SOAJ injection Inject 0.3 mg into the muscle as needed for anaphylaxis. Call EMS if you use this. 2 each 1   estradiol (ESTRACE) 1 MG tablet Take 1 mg by mouth daily.     famotidine (PEPCID) 20 MG tablet Take 1 tablet (20 mg total) by mouth 2 (two) times daily for 7 days. 14 tablet 0   HYDROcodone-acetaminophen (NORCO) 10-325 MG per tablet Take 1-2 tablets by mouth every 6 (six) hours as needed for severe pain (takes one-half tablet).      lamoTRIgine (LAMICTAL) 150 MG tablet Take 1 tablet (150 mg total) by mouth 2 (two) times daily. 60 tablet 5   levothyroxine (SYNTHROID) 50 MCG tablet Take 50 mcg by mouth every morning.     naproxen (NAPROSYN) 500 MG tablet Take 500 mg by mouth 2 (two) times daily as needed for mild pain.     pregabalin (LYRICA) 75 MG capsule Take 75 mg by mouth 2 (two) times daily.     QUEtiapine (SEROQUEL) 50 MG tablet Take one tablet at bedtime. 30 tablet 5   tiZANidine (ZANAFLEX) 4 MG tablet Take 4 mg by mouth every 6 (six) hours as needed for muscle spasms.     valACYclovir (VALTREX)  500 MG tablet Take 500 mg by mouth daily. As needed for outbreaks     valsartan-hydrochlorothiazide (DIOVAN-HCT) 80-12.5 MG tablet Take 1 tablet by mouth daily.     No current facility-administered medications for this visit.    Medication Side Effects: None  Allergies:  Allergies  Allergen Reactions   Shellfish Allergy     Shrimp ok,  Scallops, clams, oysters give angioedema  Other Reaction(s): Not available   Wheat Other (See Comments) and Nausea And Vomiting    Upset stomach    Past Medical History:  Diagnosis Date   Anxiety    Bile  duct abnormality    Complication of anesthesia    Takes alot to keep patient under anesthesia   Diverticulitis    Hypertension     Past Medical History, Surgical history, Social history, and Family history were reviewed and updated as appropriate.   Please see review of systems for further details on the patient's review from today.   Objective:   Physical Exam:  There were no vitals taken for this visit.  Physical Exam Constitutional:      General: She is not in acute distress. Musculoskeletal:        General: No deformity.  Neurological:     Mental Status: She is alert and oriented to person, place, and time.     Coordination: Coordination normal.  Psychiatric:        Attention and Perception: Attention and perception normal. She does not perceive auditory or visual hallucinations.        Mood and Affect: Mood normal. Mood is not anxious or depressed. Affect is not labile, blunt, angry or inappropriate.        Speech: Speech normal.        Behavior: Behavior normal.        Thought Content: Thought content normal. Thought content is not paranoid or delusional. Thought content does not include homicidal or suicidal ideation. Thought content does not include homicidal or suicidal plan.        Cognition and Memory: Cognition and memory normal.        Judgment: Judgment normal.     Comments: Insight intact     Lab Review:     Component Value Date/Time   NA 139 05/22/2022 0120   K 4.4 05/22/2022 0120   CL 114 (H) 05/22/2022 0120   CO2 20 (L) 05/22/2022 0120   GLUCOSE 108 (H) 05/22/2022 0120   BUN 18 05/22/2022 0120   CREATININE 1.13 (H) 05/22/2022 0120   CALCIUM 9.0 05/22/2022 0120   PROT 5.2 (L) 05/22/2022 0120   ALBUMIN 2.8 (L) 05/22/2022 0120   AST 16 05/22/2022 0120   ALT 13 05/22/2022 0120   ALKPHOS 27 (L) 05/22/2022 0120   BILITOT 0.6 05/22/2022 0120   GFRNONAA 52 (L) 05/22/2022 0120   GFRAA >60 12/21/2017 0923       Component Value Date/Time   WBC 12.7  (H) 05/22/2022 0120   RBC 3.31 (L) 05/22/2022 0120   HGB 10.1 (L) 05/22/2022 0120   HCT 29.9 (L) 05/22/2022 0120   PLT 183 05/22/2022 0120   MCV 90.3 05/22/2022 0120   MCH 30.5 05/22/2022 0120   MCHC 33.8 05/22/2022 0120   RDW 16.5 (H) 05/22/2022 0120   LYMPHSABS 1.1 09/24/2015 0858   MONOABS 0.4 09/24/2015 0858   EOSABS 0.0 09/24/2015 0858   BASOSABS 0.0 09/24/2015 0858    Lithium Lvl  Date Value Ref Range Status  02/26/2016 <0.06 (L) 0.60 -  1.20 mmol/L Final     No results found for: "PHENYTOIN", "PHENOBARB", "VALPROATE", "CBMZ"   .res Assessment: Plan:    Plan:  PDMP reviewed  Lamictal 150mg  BID Seroquel 50mg  at bedtime Xanax 1mg  twice daily - has taken Xanax for several years.   Seen at the [facility]  Time spent with patient was 60 minutes. Greater than 50% of face to face time with patient was spent on counseling and coordination of care.    RTC 3 months  Patient advised to contact office with any questions, adverse effects, or acute worsening in signs and symptoms.   Discussed potential benefits, risk, and side effects of benzodiazepines to include potential risk of tolerance and dependence, as well as possible drowsiness.  Advised patient not to drive if experiencing drowsiness and to take lowest possible effective dose to minimize risk of dependence and tolerance.   Counseled patient regarding potential benefits, risks, and side effects of Lamictal to include potential risk of Stevens-Johnson syndrome. Advised patient to stop taking Lamictal and contact office immediately if rash develops and to seek urgent medical attention if rash is severe and/or spreading quickly.    Diagnoses and all orders for this visit:  Major depressive disorder, recurrent episode, moderate (HCC) -     QUEtiapine (SEROQUEL) 50 MG tablet; Take one tablet at bedtime. -     lamoTRIgine (LAMICTAL) 150 MG tablet; Take 1 tablet (150 mg total) by mouth 2 (two)  times daily.  Generalized anxiety disorder -     ALPRAZolam (XANAX) 1 MG tablet; Take 1 tablet (1 mg total) by mouth 2 (two) times daily as needed for anxiety.     Please see After Visit Summary for patient specific instructions.  No future appointments.   No orders of the defined types were placed in this encounter.   -------------------------------

## 2023-03-13 ENCOUNTER — Telehealth: Payer: Self-pay | Admitting: Adult Health

## 2023-03-13 DIAGNOSIS — M79604 Pain in right leg: Secondary | ICD-10-CM | POA: Diagnosis not present

## 2023-03-13 DIAGNOSIS — M4326 Fusion of spine, lumbar region: Secondary | ICD-10-CM | POA: Diagnosis not present

## 2023-03-13 NOTE — Telephone Encounter (Signed)
Patient pharmacy. Patient had been getting alprazolam from another provider. Barnett Applebaum said she is now prescribing it for her. Let pharmacy know.

## 2023-03-13 NOTE — Telephone Encounter (Signed)
Someone from [facility] LVM @ 12:33p.  She said they had several scripts for the same medication and they need clarification.  Pls call them back at  [phone]  Next appt 6/26

## 2023-03-18 DIAGNOSIS — M6281 Muscle weakness (generalized): Secondary | ICD-10-CM | POA: Diagnosis not present

## 2023-03-18 DIAGNOSIS — M5459 Other low back pain: Secondary | ICD-10-CM | POA: Diagnosis not present

## 2023-03-18 DIAGNOSIS — M25551 Pain in right hip: Secondary | ICD-10-CM | POA: Diagnosis not present

## 2023-04-08 DIAGNOSIS — M25551 Pain in right hip: Secondary | ICD-10-CM | POA: Diagnosis not present

## 2023-04-08 DIAGNOSIS — M6281 Muscle weakness (generalized): Secondary | ICD-10-CM | POA: Diagnosis not present

## 2023-04-08 DIAGNOSIS — M5459 Other low back pain: Secondary | ICD-10-CM | POA: Diagnosis not present

## 2023-04-11 DIAGNOSIS — M79604 Pain in right leg: Secondary | ICD-10-CM | POA: Diagnosis not present

## 2023-04-11 DIAGNOSIS — M6281 Muscle weakness (generalized): Secondary | ICD-10-CM | POA: Diagnosis not present

## 2023-04-11 DIAGNOSIS — M5459 Other low back pain: Secondary | ICD-10-CM | POA: Diagnosis not present

## 2023-04-11 DIAGNOSIS — M25551 Pain in right hip: Secondary | ICD-10-CM | POA: Diagnosis not present

## 2023-04-11 DIAGNOSIS — M4326 Fusion of spine, lumbar region: Secondary | ICD-10-CM | POA: Diagnosis not present

## 2023-04-22 DIAGNOSIS — M5459 Other low back pain: Secondary | ICD-10-CM | POA: Diagnosis not present

## 2023-04-22 DIAGNOSIS — M6281 Muscle weakness (generalized): Secondary | ICD-10-CM | POA: Diagnosis not present

## 2023-04-22 DIAGNOSIS — M25551 Pain in right hip: Secondary | ICD-10-CM | POA: Diagnosis not present

## 2023-04-29 DIAGNOSIS — M6281 Muscle weakness (generalized): Secondary | ICD-10-CM | POA: Diagnosis not present

## 2023-04-29 DIAGNOSIS — M5459 Other low back pain: Secondary | ICD-10-CM | POA: Diagnosis not present

## 2023-04-29 DIAGNOSIS — M25551 Pain in right hip: Secondary | ICD-10-CM | POA: Diagnosis not present

## 2023-05-02 DIAGNOSIS — M5459 Other low back pain: Secondary | ICD-10-CM | POA: Diagnosis not present

## 2023-05-02 DIAGNOSIS — M25551 Pain in right hip: Secondary | ICD-10-CM | POA: Diagnosis not present

## 2023-05-02 DIAGNOSIS — M6281 Muscle weakness (generalized): Secondary | ICD-10-CM | POA: Diagnosis not present

## 2023-05-07 DIAGNOSIS — Z79899 Other long term (current) drug therapy: Secondary | ICD-10-CM | POA: Diagnosis not present

## 2023-05-07 DIAGNOSIS — M4326 Fusion of spine, lumbar region: Secondary | ICD-10-CM | POA: Diagnosis not present

## 2023-05-07 DIAGNOSIS — Z79891 Long term (current) use of opiate analgesic: Secondary | ICD-10-CM | POA: Diagnosis not present

## 2023-05-07 DIAGNOSIS — M79604 Pain in right leg: Secondary | ICD-10-CM | POA: Diagnosis not present

## 2023-05-07 DIAGNOSIS — G894 Chronic pain syndrome: Secondary | ICD-10-CM | POA: Diagnosis not present

## 2023-05-14 DIAGNOSIS — M6281 Muscle weakness (generalized): Secondary | ICD-10-CM | POA: Diagnosis not present

## 2023-05-14 DIAGNOSIS — M5459 Other low back pain: Secondary | ICD-10-CM | POA: Diagnosis not present

## 2023-05-14 DIAGNOSIS — M25551 Pain in right hip: Secondary | ICD-10-CM | POA: Diagnosis not present

## 2023-05-15 ENCOUNTER — Ambulatory Visit (INDEPENDENT_AMBULATORY_CARE_PROVIDER_SITE_OTHER): Payer: Medicare Other | Admitting: Adult Health

## 2023-05-15 ENCOUNTER — Encounter: Payer: Self-pay | Admitting: Adult Health

## 2023-05-15 DIAGNOSIS — F331 Major depressive disorder, recurrent, moderate: Secondary | ICD-10-CM

## 2023-05-15 DIAGNOSIS — F411 Generalized anxiety disorder: Secondary | ICD-10-CM

## 2023-05-15 MED ORDER — ALPRAZOLAM 1 MG PO TABS: 1.0000 mg | ORAL_TABLET | Freq: Three times a day (TID) | ORAL | 0 refills | Status: DC

## 2023-05-15 NOTE — Progress Notes (Signed)
Carol Duncan [id number] February 18, 1951 72 y.o.  Subjective:   Patient ID:  Carol Duncan is a 72 y.o. (DOB November 14, 1951) female.  Chief Complaint: No chief complaint on file.   HPI Carol Duncan presents to the office today for follow-up of MDD and GAD.   Currently seen at [facility].  Describes mood today as "ok". Pleasant. Denies tearfulness. Mood symptoms - reports depression and anxiety. Denies irritability. Reports worry, rumination, and over thinking. Stating "I think in circles". Denies obsessive thoughts and acts. Mood is variable. Stating "I haven't been doing good - I'm a hot mess right now". Feels like medications are helpful. Stable interest and motivation. Taking medications as prescribed.  Energy levels stable. Active, has a regular exercise routine. Walking dog daily - labrodoole.  Enjoys some usual interests and activities. Divorced. Lives with a roommate. Spending time with family. Appetite increased - starting eating sugar again. Weight gain. Sleeps well most nights. Averages 7 hours. Focus and concentration stable. Completing tasks. Managing aspects of household. Retired Denies SI or HI.  Denies AH or VH. Denies self harm. Denies substance use.   Previous medication trials: Wellbutrin, Lithium,others   Flowsheet Row ED to Hosp-Admission (Discharged) from 05/21/2022 in [facility]  SURGICAL  C-SSRS RISK CATEGORY No Risk        Review of Systems:  Review of Systems  Musculoskeletal:  Negative for gait problem.  Neurological:  Negative for tremors.  Psychiatric/Behavioral:         Please refer to HPI    Medications: I have reviewed the patient's current medications.  Current Outpatient Medications  Medication Sig Dispense Refill   ALPRAZolam (XANAX) 1 MG tablet Take 1 tablet (1 mg total) by mouth 2 (two) times daily as needed for anxiety. 60 tablet 2   cetirizine (ZYRTEC  ALLERGY) 10 MG tablet Take 1 tablet (10 mg total) by mouth daily for 7 days. 7 tablet 0   doxycycline (VIBRAMYCIN) 100 MG capsule Take 100 mg by mouth 2 (two) times daily as needed.     EPINEPHrine 0.3 mg/0.3 mL IJ SOAJ injection Inject 0.3 mg into the muscle as needed for anaphylaxis. 1 each 0   EPINEPHrine 0.3 mg/0.3 mL IJ SOAJ injection Inject 0.3 mg into the muscle as needed for anaphylaxis. Call EMS if you use this. 2 each 1   estradiol (ESTRACE) 1 MG tablet Take 1 mg by mouth daily.     famotidine (PEPCID) 20 MG tablet Take 1 tablet (20 mg total) by mouth 2 (two) times daily for 7 days. 14 tablet 0   HYDROcodone-acetaminophen (NORCO) 10-325 MG per tablet Take 1-2 tablets by mouth every 6 (six) hours as needed for severe pain (takes one-half tablet).      lamoTRIgine (LAMICTAL) 150 MG tablet Take 1 tablet (150 mg total) by mouth 2 (two) times daily. 60 tablet 5   levothyroxine (SYNTHROID) 50 MCG tablet Take 50 mcg by mouth every morning.     naproxen (NAPROSYN) 500 MG tablet Take 500 mg by mouth 2 (two) times daily as needed for mild pain.     pregabalin (LYRICA) 75 MG capsule Take 75 mg by mouth 2 (two) times daily.     QUEtiapine (SEROQUEL) 50 MG tablet Take one tablet at bedtime. 30 tablet 5   tiZANidine (ZANAFLEX) 4 MG tablet Take 4 mg by mouth every 6 (six) hours as needed for muscle spasms.     valACYclovir (VALTREX) 500  MG tablet Take 500 mg by mouth daily. As needed for outbreaks     valsartan-hydrochlorothiazide (DIOVAN-HCT) 80-12.5 MG tablet Take 1 tablet by mouth daily.     No current facility-administered medications for this visit.    Medication Side Effects: None  Allergies:  Allergies  Allergen Reactions   Shellfish Allergy     Shrimp ok,  Scallops, clams, oysters give angioedema  Other Reaction(s): Not available   Wheat Other (See Comments) and Nausea And Vomiting    Upset stomach    Past Medical History:  Diagnosis Date   Anxiety    Bile duct abnormality     Complication of anesthesia    Takes alot to keep patient under anesthesia   Diverticulitis    Hypertension     Past Medical History, Surgical history, Social history, and Family history were reviewed and updated as appropriate.   Please see review of systems for further details on the patient's review from today.   Objective:   Physical Exam:  There were no vitals taken for this visit.  Physical Exam Constitutional:      General: She is not in acute distress. Musculoskeletal:        General: No deformity.  Neurological:     Mental Status: She is alert and oriented to person, place, and time.     Coordination: Coordination normal.  Psychiatric:        Attention and Perception: Attention and perception normal. She does not perceive auditory or visual hallucinations.        Mood and Affect: Mood normal. Mood is not anxious or depressed. Affect is not labile, blunt, angry or inappropriate.        Speech: Speech normal.        Behavior: Behavior normal.        Thought Content: Thought content normal. Thought content is not paranoid or delusional. Thought content does not include homicidal or suicidal ideation. Thought content does not include homicidal or suicidal plan.        Cognition and Memory: Cognition and memory normal.        Judgment: Judgment normal.     Comments: Insight intact     Lab Review:     Component Value Date/Time   NA 139 05/22/2022 0120   K 4.4 05/22/2022 0120   CL 114 (H) 05/22/2022 0120   CO2 20 (L) 05/22/2022 0120   GLUCOSE 108 (H) 05/22/2022 0120   BUN 18 05/22/2022 0120   CREATININE 1.13 (H) 05/22/2022 0120   CALCIUM 9.0 05/22/2022 0120   PROT 5.2 (L) 05/22/2022 0120   ALBUMIN 2.8 (L) 05/22/2022 0120   AST 16 05/22/2022 0120   ALT 13 05/22/2022 0120   ALKPHOS 27 (L) 05/22/2022 0120   BILITOT 0.6 05/22/2022 0120   GFRNONAA 52 (L) 05/22/2022 0120   GFRAA >60 12/21/2017 0923       Component Value Date/Time   WBC 12.7 (H) 05/22/2022 0120    RBC 3.31 (L) 05/22/2022 0120   HGB 10.1 (L) 05/22/2022 0120   HCT 29.9 (L) 05/22/2022 0120   PLT 183 05/22/2022 0120   MCV 90.3 05/22/2022 0120   MCH 30.5 05/22/2022 0120   MCHC 33.8 05/22/2022 0120   RDW 16.5 (H) 05/22/2022 0120   LYMPHSABS 1.1 09/24/2015 0858   MONOABS 0.4 09/24/2015 0858   EOSABS 0.0 09/24/2015 0858   BASOSABS 0.0 09/24/2015 0858    Lithium Lvl  Date Value Ref Range Status  02/26/2016 <0.06 (L) 0.60 - 1.20  mmol/L Final     No results found for: "PHENYTOIN", "PHENOBARB", "VALPROATE", "CBMZ"   .res Assessment: Plan:    Plan:  PDMP reviewed  Lamictal 150mg  BID Seroquel 50mg  at bedtime Xanax 1mg  twice daily - patient reports taking an increased dose every other day and will run out early. Discussed with patient the importance of taking medications as prescribed. Patient voiced an understanding and will take as prescribed moving forward. Patient notified she will be tapered off the medication and medication will be discontinued if over taking again.   RTC 4 weeks  Patient advised to contact office with any questions, adverse effects, or acute worsening in signs and symptoms.   Discussed potential benefits, risk, and side effects of benzodiazepines to include potential risk of tolerance and dependence, as well as possible drowsiness. Advised patient not to drive if experiencing drowsiness and to take lowest possible effective dose to minimize risk of dependence and tolerance.   Counseled patient regarding potential benefits, risks, and side effects of Lamictal to include potential risk of Stevens-Johnson syndrome. Advised patient to stop taking Lamictal and contact office immediately if rash develops and to seek urgent medical attention if rash is severe and/or spreading quickly.   There are no diagnoses linked to this encounter.   Please see After Visit Summary for patient specific instructions.  Future Appointments  Date Time Provider Department Center   06/12/2023  2:00 PM Darly Fails, Thereasa Solo, NP [facility] None    No orders of the defined types were placed in this encounter.   -------------------------------

## 2023-05-20 DIAGNOSIS — M6281 Muscle weakness (generalized): Secondary | ICD-10-CM | POA: Diagnosis not present

## 2023-05-20 DIAGNOSIS — M5459 Other low back pain: Secondary | ICD-10-CM | POA: Diagnosis not present

## 2023-05-20 DIAGNOSIS — M25551 Pain in right hip: Secondary | ICD-10-CM | POA: Diagnosis not present

## 2023-05-23 DIAGNOSIS — M6281 Muscle weakness (generalized): Secondary | ICD-10-CM | POA: Diagnosis not present

## 2023-05-23 DIAGNOSIS — M5459 Other low back pain: Secondary | ICD-10-CM | POA: Diagnosis not present

## 2023-05-23 DIAGNOSIS — M25551 Pain in right hip: Secondary | ICD-10-CM | POA: Diagnosis not present

## 2023-05-27 DIAGNOSIS — M5459 Other low back pain: Secondary | ICD-10-CM | POA: Diagnosis not present

## 2023-05-27 DIAGNOSIS — M25551 Pain in right hip: Secondary | ICD-10-CM | POA: Diagnosis not present

## 2023-05-27 DIAGNOSIS — M6281 Muscle weakness (generalized): Secondary | ICD-10-CM | POA: Diagnosis not present

## 2023-06-03 DIAGNOSIS — M6281 Muscle weakness (generalized): Secondary | ICD-10-CM | POA: Diagnosis not present

## 2023-06-03 DIAGNOSIS — M25551 Pain in right hip: Secondary | ICD-10-CM | POA: Diagnosis not present

## 2023-06-03 DIAGNOSIS — M5459 Other low back pain: Secondary | ICD-10-CM | POA: Diagnosis not present

## 2023-06-04 DIAGNOSIS — M79604 Pain in right leg: Secondary | ICD-10-CM | POA: Diagnosis not present

## 2023-06-04 DIAGNOSIS — M4326 Fusion of spine, lumbar region: Secondary | ICD-10-CM | POA: Diagnosis not present

## 2023-06-06 DIAGNOSIS — M6281 Muscle weakness (generalized): Secondary | ICD-10-CM | POA: Diagnosis not present

## 2023-06-06 DIAGNOSIS — M25551 Pain in right hip: Secondary | ICD-10-CM | POA: Diagnosis not present

## 2023-06-06 DIAGNOSIS — M5459 Other low back pain: Secondary | ICD-10-CM | POA: Diagnosis not present

## 2023-06-10 DIAGNOSIS — M6281 Muscle weakness (generalized): Secondary | ICD-10-CM | POA: Diagnosis not present

## 2023-06-10 DIAGNOSIS — M5459 Other low back pain: Secondary | ICD-10-CM | POA: Diagnosis not present

## 2023-06-10 DIAGNOSIS — M25551 Pain in right hip: Secondary | ICD-10-CM | POA: Diagnosis not present

## 2023-06-12 ENCOUNTER — Ambulatory Visit (INDEPENDENT_AMBULATORY_CARE_PROVIDER_SITE_OTHER): Payer: Medicare Other | Admitting: Adult Health

## 2023-06-12 ENCOUNTER — Encounter: Payer: Self-pay | Admitting: Adult Health

## 2023-06-12 DIAGNOSIS — F411 Generalized anxiety disorder: Secondary | ICD-10-CM

## 2023-06-12 DIAGNOSIS — F331 Major depressive disorder, recurrent, moderate: Secondary | ICD-10-CM

## 2023-06-12 MED ORDER — ALPRAZOLAM 1 MG PO TABS: 1.0000 mg | ORAL_TABLET | Freq: Two times a day (BID) | ORAL | 2 refills | Status: DC | PRN

## 2023-06-12 NOTE — Progress Notes (Signed)
Carol Duncan [id number] Nov 15, 1951 73 y.o.  Subjective:   Patient ID:  Carol Duncan is a 72 y.o. (DOB Jun 23, 1951) female.  Chief Complaint: No chief complaint on file.   HPI Carol Duncan presents to the office today for follow-up of MDD and GAD.   Describes mood today as "ok". Pleasant. Denies tearfulness. Mood symptoms - reports depression and anxiety. Reports irritability at times. Reports panic attacks. Reports worry, rumination, and over thinking. Reports obsessive thoughts and acts. Mood is variable. Stating "I feel like the medications are helpful". Varying interest and motivation. Taking medications as prescribed.  Energy levels stable. Active, does not have a regular exercise routine with physical limitations. Enjoys some usual interests and activities. Divorced. Lives with a roommate. Spending time with family. Appetite increased. Weight gain - 20 pounds since last year. Sleeps well most nights. Averages 7 hours. Focus and concentration stable. Completing tasks. Managing aspects of household. Retired Denies SI or HI.  Denies AH or VH. Denies self harm. Denies substance use.   Previous medication trials: Wellbutrin, Lithium,others   Flowsheet Row ED to Hosp-Admission (Discharged) from 05/21/2022 in [facility]  SURGICAL  C-SSRS RISK CATEGORY No Risk        Review of Systems:  Review of Systems  Musculoskeletal:  Negative for gait problem.  Neurological:  Negative for tremors.  Psychiatric/Behavioral:         Please refer to HPI    Medications: I have reviewed the patient's current medications.  Current Outpatient Medications  Medication Sig Dispense Refill   ALPRAZolam (XANAX) 1 MG tablet Take 1 tablet (1 mg total) by mouth 2 (two) times daily as needed for anxiety. 60 tablet 2   cetirizine (ZYRTEC ALLERGY) 10 MG tablet Take 1 tablet (10 mg total) by mouth daily for 7 days. 7 tablet 0   doxycycline  (VIBRAMYCIN) 100 MG capsule Take 100 mg by mouth 2 (two) times daily as needed.     EPINEPHrine 0.3 mg/0.3 mL IJ SOAJ injection Inject 0.3 mg into the muscle as needed for anaphylaxis. 1 each 0   EPINEPHrine 0.3 mg/0.3 mL IJ SOAJ injection Inject 0.3 mg into the muscle as needed for anaphylaxis. Call EMS if you use this. 2 each 1   estradiol (ESTRACE) 1 MG tablet Take 1 mg by mouth daily.     famotidine (PEPCID) 20 MG tablet Take 1 tablet (20 mg total) by mouth 2 (two) times daily for 7 days. 14 tablet 0   HYDROcodone-acetaminophen (NORCO) 10-325 MG per tablet Take 1-2 tablets by mouth every 6 (six) hours as needed for severe pain (takes one-half tablet).      lamoTRIgine (LAMICTAL) 150 MG tablet Take 1 tablet (150 mg total) by mouth 2 (two) times daily. 60 tablet 5   levothyroxine (SYNTHROID) 50 MCG tablet Take 50 mcg by mouth every morning.     naproxen (NAPROSYN) 500 MG tablet Take 500 mg by mouth 2 (two) times daily as needed for mild pain.     pregabalin (LYRICA) 75 MG capsule Take 75 mg by mouth 2 (two) times daily.     QUEtiapine (SEROQUEL) 50 MG tablet Take one tablet at bedtime. 30 tablet 5   tiZANidine (ZANAFLEX) 4 MG tablet Take 4 mg by mouth every 6 (six) hours as needed for muscle spasms.     valACYclovir (VALTREX) 500 MG tablet Take 500 mg by mouth daily. As needed for outbreaks     valsartan-hydrochlorothiazide (DIOVAN-HCT) 80-12.5 MG tablet  Take 1 tablet by mouth daily.     No current facility-administered medications for this visit.    Medication Side Effects: None  Allergies:  Allergies  Allergen Reactions   Shellfish Allergy     Shrimp ok,  Scallops, clams, oysters give angioedema  Other Reaction(s): Not available   Wheat Other (See Comments) and Nausea And Vomiting    Upset stomach    Past Medical History:  Diagnosis Date   Anxiety    Bile duct abnormality    Complication of anesthesia    Takes alot to keep patient under anesthesia   Diverticulitis     Hypertension     Past Medical History, Surgical history, Social history, and Family history were reviewed and updated as appropriate.   Please see review of systems for further details on the patient's review from today.   Objective:   Physical Exam:  There were no vitals taken for this visit.  Physical Exam Constitutional:      General: She is not in acute distress. Musculoskeletal:        General: No deformity.  Neurological:     Mental Status: She is alert and oriented to person, place, and time.     Coordination: Coordination normal.  Psychiatric:        Attention and Perception: Attention and perception normal. She does not perceive auditory or visual hallucinations.        Mood and Affect: Mood normal. Mood is not anxious or depressed. Affect is not labile, blunt, angry or inappropriate.        Speech: Speech normal.        Behavior: Behavior normal.        Thought Content: Thought content normal. Thought content is not paranoid or delusional. Thought content does not include homicidal or suicidal ideation. Thought content does not include homicidal or suicidal plan.        Cognition and Memory: Cognition and memory normal.        Judgment: Judgment normal.     Comments: Insight intact     Lab Review:     Component Value Date/Time   NA 139 05/22/2022 0120   K 4.4 05/22/2022 0120   CL 114 (H) 05/22/2022 0120   CO2 20 (L) 05/22/2022 0120   GLUCOSE 108 (H) 05/22/2022 0120   BUN 18 05/22/2022 0120   CREATININE 1.13 (H) 05/22/2022 0120   CALCIUM 9.0 05/22/2022 0120   PROT 5.2 (L) 05/22/2022 0120   ALBUMIN 2.8 (L) 05/22/2022 0120   AST 16 05/22/2022 0120   ALT 13 05/22/2022 0120   ALKPHOS 27 (L) 05/22/2022 0120   BILITOT 0.6 05/22/2022 0120   GFRNONAA 52 (L) 05/22/2022 0120   GFRAA >60 12/21/2017 0923       Component Value Date/Time   WBC 12.7 (H) 05/22/2022 0120   RBC 3.31 (L) 05/22/2022 0120   HGB 10.1 (L) 05/22/2022 0120   HCT 29.9 (L) 05/22/2022 0120    PLT 183 05/22/2022 0120   MCV 90.3 05/22/2022 0120   MCH 30.5 05/22/2022 0120   MCHC 33.8 05/22/2022 0120   RDW 16.5 (H) 05/22/2022 0120   LYMPHSABS 1.1 09/24/2015 0858   MONOABS 0.4 09/24/2015 0858   EOSABS 0.0 09/24/2015 0858   BASOSABS 0.0 09/24/2015 0858    Lithium Lvl  Date Value Ref Range Status  02/26/2016 <0.06 (L) 0.60 - 1.20 mmol/L Final     No results found for: "PHENYTOIN", "PHENOBARB", "VALPROATE", "CBMZ"   .res Assessment: Plan:  Plan:  PDMP reviewed  Lamictal 150mg  BID Seroquel 50mg  at bedtime Xanax 1mg  twice daily.  RTC 2 months  Patient advised to contact office with any questions, adverse effects, or acute worsening in signs and symptoms.   Discussed potential benefits, risk, and side effects of benzodiazepines to include potential risk of tolerance and dependence, as well as possible drowsiness. Advised patient not to drive if experiencing drowsiness and to take lowest possible effective dose to minimize risk of dependence and tolerance.   Counseled patient regarding potential benefits, risks, and side effects of Lamictal to include potential risk of Stevens-Johnson syndrome. Advised patient to stop taking Lamictal and contact office immediately if rash develops and to seek urgent medical attention if rash is severe and/or spreading quickly.   Diagnoses and all orders for this visit:  Major depressive disorder, recurrent episode, moderate (HCC)  Generalized anxiety disorder -     ALPRAZolam (XANAX) 1 MG tablet; Take 1 tablet (1 mg total) by mouth 2 (two) times daily as needed for anxiety.     Please see After Visit Summary for patient specific instructions.  No future appointments.   No orders of the defined types were placed in this encounter.   -------------------------------

## 2023-06-13 DIAGNOSIS — M25551 Pain in right hip: Secondary | ICD-10-CM | POA: Diagnosis not present

## 2023-06-13 DIAGNOSIS — M5459 Other low back pain: Secondary | ICD-10-CM | POA: Diagnosis not present

## 2023-06-13 DIAGNOSIS — M6281 Muscle weakness (generalized): Secondary | ICD-10-CM | POA: Diagnosis not present

## 2023-06-14 DIAGNOSIS — I10 Essential (primary) hypertension: Secondary | ICD-10-CM | POA: Diagnosis not present

## 2023-06-14 DIAGNOSIS — R41840 Attention and concentration deficit: Secondary | ICD-10-CM | POA: Diagnosis not present

## 2023-06-14 DIAGNOSIS — D649 Anemia, unspecified: Secondary | ICD-10-CM | POA: Diagnosis not present

## 2023-06-14 DIAGNOSIS — F322 Major depressive disorder, single episode, severe without psychotic features: Secondary | ICD-10-CM | POA: Diagnosis not present

## 2023-06-14 DIAGNOSIS — E039 Hypothyroidism, unspecified: Secondary | ICD-10-CM | POA: Diagnosis not present

## 2023-06-25 DIAGNOSIS — M6281 Muscle weakness (generalized): Secondary | ICD-10-CM | POA: Diagnosis not present

## 2023-06-25 DIAGNOSIS — M25551 Pain in right hip: Secondary | ICD-10-CM | POA: Diagnosis not present

## 2023-06-25 DIAGNOSIS — M5459 Other low back pain: Secondary | ICD-10-CM | POA: Diagnosis not present

## 2023-06-27 DIAGNOSIS — M6281 Muscle weakness (generalized): Secondary | ICD-10-CM | POA: Diagnosis not present

## 2023-06-27 DIAGNOSIS — M25551 Pain in right hip: Secondary | ICD-10-CM | POA: Diagnosis not present

## 2023-06-27 DIAGNOSIS — M5459 Other low back pain: Secondary | ICD-10-CM | POA: Diagnosis not present

## 2023-07-04 DIAGNOSIS — M5459 Other low back pain: Secondary | ICD-10-CM | POA: Diagnosis not present

## 2023-07-04 DIAGNOSIS — M6281 Muscle weakness (generalized): Secondary | ICD-10-CM | POA: Diagnosis not present

## 2023-07-04 DIAGNOSIS — M25551 Pain in right hip: Secondary | ICD-10-CM | POA: Diagnosis not present

## 2023-07-09 DIAGNOSIS — M79604 Pain in right leg: Secondary | ICD-10-CM | POA: Diagnosis not present

## 2023-07-09 DIAGNOSIS — M4326 Fusion of spine, lumbar region: Secondary | ICD-10-CM | POA: Diagnosis not present

## 2023-07-10 ENCOUNTER — Other Ambulatory Visit: Payer: Self-pay | Admitting: Orthopedic Surgery

## 2023-07-10 DIAGNOSIS — M4326 Fusion of spine, lumbar region: Secondary | ICD-10-CM

## 2023-07-11 DIAGNOSIS — M6281 Muscle weakness (generalized): Secondary | ICD-10-CM | POA: Diagnosis not present

## 2023-07-11 DIAGNOSIS — M5459 Other low back pain: Secondary | ICD-10-CM | POA: Diagnosis not present

## 2023-07-11 DIAGNOSIS — M25551 Pain in right hip: Secondary | ICD-10-CM | POA: Diagnosis not present

## 2023-07-12 ENCOUNTER — Ambulatory Visit: Payer: Medicare Other | Admitting: Adult Health

## 2023-07-12 ENCOUNTER — Encounter: Payer: Self-pay | Admitting: Adult Health

## 2023-07-12 DIAGNOSIS — F331 Major depressive disorder, recurrent, moderate: Secondary | ICD-10-CM | POA: Diagnosis not present

## 2023-07-12 DIAGNOSIS — F411 Generalized anxiety disorder: Secondary | ICD-10-CM

## 2023-07-12 MED ORDER — QUETIAPINE FUMARATE 25 MG PO TABS: 25.0000 mg | ORAL_TABLET | Freq: Every day | ORAL | 5 refills | Status: DC

## 2023-07-12 NOTE — Progress Notes (Signed)
Carol Duncan [id number] [birth year] 72 y.o.  Subjective:   Patient ID:  Carol Duncan is a 72 y.o. (DOB 1951-01-28) female.  Chief Complaint: No chief complaint on file.   HPI Carol Duncan presents to the office today for follow-up of MDD and GAD.   Describes mood today as "ok". Pleasant. Denies tearfulness. Mood symptoms - reports depression and anxiety. Reports irritability and anger. Reports panic attacks. Reports worry, rumination, and over thinking. Reports obsessive thoughts and acts. Mood is variable. Stating "I feel like the medications are helpful". Would like to increase dose of Seroquel from 50mg  to 75mg  daily. Varying interest and motivation. Taking medications as prescribed.  Energy levels stable. Active, does not have a regular exercise routine with physical limitations. Enjoys some usual interests and activities. Divorced. Lives with a roommate. Spending time with family. Appetite increased. Weight gain - 10 pounds since the beginning of the year. Sleeps well most nights. Averages 4 to 5 from 7 hours reported last visit. Focus and concentration difficulties - taking 20mg  of Adderall daily. Completing tasks. Managing aspects of household. Retired. Denies SI or HI.  Denies AH or VH. Denies self harm. Denies substance use.   Therapist - Meredeth Ide  Previous medication trials: Wellbutrin, Lithium,others   Flowsheet Row ED to Hosp-Admission (Discharged) from 05/21/2022 in [facility]  SURGICAL  C-SSRS RISK CATEGORY No Risk        Review of Systems:  Review of Systems  Musculoskeletal:  Negative for gait problem.  Neurological:  Negative for tremors.  Psychiatric/Behavioral:         Please refer to HPI    Medications: I have reviewed the patient's current medications.  Current Outpatient Medications  Medication Sig Dispense Refill   ALPRAZolam (XANAX) 1 MG tablet Take 1 tablet (1 mg total) by mouth  2 (two) times daily as needed for anxiety. 60 tablet 2   cetirizine (ZYRTEC ALLERGY) 10 MG tablet Take 1 tablet (10 mg total) by mouth daily for 7 days. 7 tablet 0   doxycycline (VIBRAMYCIN) 100 MG capsule Take 100 mg by mouth 2 (two) times daily as needed.     EPINEPHrine 0.3 mg/0.3 mL IJ SOAJ injection Inject 0.3 mg into the muscle as needed for anaphylaxis. 1 each 0   EPINEPHrine 0.3 mg/0.3 mL IJ SOAJ injection Inject 0.3 mg into the muscle as needed for anaphylaxis. Call EMS if you use this. 2 each 1   estradiol (ESTRACE) 1 MG tablet Take 1 mg by mouth daily.     famotidine (PEPCID) 20 MG tablet Take 1 tablet (20 mg total) by mouth 2 (two) times daily for 7 days. 14 tablet 0   HYDROcodone-acetaminophen (NORCO) 10-325 MG per tablet Take 1-2 tablets by mouth every 6 (six) hours as needed for severe pain (takes one-half tablet).      lamoTRIgine (LAMICTAL) 150 MG tablet Take 1 tablet (150 mg total) by mouth 2 (two) times daily. 60 tablet 5   levothyroxine (SYNTHROID) 50 MCG tablet Take 50 mcg by mouth every morning.     naproxen (NAPROSYN) 500 MG tablet Take 500 mg by mouth 2 (two) times daily as needed for mild pain.     pregabalin (LYRICA) 75 MG capsule Take 75 mg by mouth 2 (two) times daily.     QUEtiapine (SEROQUEL) 50 MG tablet Take one tablet at bedtime. 30 tablet 5   tiZANidine (ZANAFLEX) 4 MG tablet Take 4 mg by mouth every 6 (six) hours  as needed for muscle spasms.     valACYclovir (VALTREX) 500 MG tablet Take 500 mg by mouth daily. As needed for outbreaks     valsartan-hydrochlorothiazide (DIOVAN-HCT) 80-12.5 MG tablet Take 1 tablet by mouth daily.     No current facility-administered medications for this visit.    Medication Side Effects: None  Allergies:  Allergies  Allergen Reactions   Shellfish Allergy     Shrimp ok,  Scallops, clams, oysters give angioedema  Other Reaction(s): Not available   Wheat Other (See Comments) and Nausea And Vomiting    Upset stomach     Past Medical History:  Diagnosis Date   Anxiety    Bile duct abnormality    Complication of anesthesia    Takes alot to keep patient under anesthesia   Diverticulitis    Hypertension     Past Medical History, Surgical history, Social history, and Family history were reviewed and updated as appropriate.   Please see review of systems for further details on the patient's review from today.   Objective:   Physical Exam:  There were no vitals taken for this visit.  Physical Exam Constitutional:      General: She is not in acute distress. Musculoskeletal:        General: No deformity.  Neurological:     Mental Status: She is alert and oriented to person, place, and time.     Coordination: Coordination normal.  Psychiatric:        Attention and Perception: Attention and perception normal. She does not perceive auditory or visual hallucinations.        Mood and Affect: Affect is not labile, blunt, angry or inappropriate.        Speech: Speech normal.        Behavior: Behavior normal.        Thought Content: Thought content normal. Thought content is not paranoid or delusional. Thought content does not include homicidal or suicidal ideation. Thought content does not include homicidal or suicidal plan.        Cognition and Memory: Cognition and memory normal.        Judgment: Judgment normal.     Comments: Insight intact     Lab Review:     Component Value Date/Time   NA 139 05/22/2022 0120   K 4.4 05/22/2022 0120   CL 114 (H) 05/22/2022 0120   CO2 20 (L) 05/22/2022 0120   GLUCOSE 108 (H) 05/22/2022 0120   BUN 18 05/22/2022 0120   CREATININE 1.13 (H) 05/22/2022 0120   CALCIUM 9.0 05/22/2022 0120   PROT 5.2 (L) 05/22/2022 0120   ALBUMIN 2.8 (L) 05/22/2022 0120   AST 16 05/22/2022 0120   ALT 13 05/22/2022 0120   ALKPHOS 27 (L) 05/22/2022 0120   BILITOT 0.6 05/22/2022 0120   GFRNONAA 52 (L) 05/22/2022 0120   GFRAA >60 12/21/2017 0923       Component Value  Date/Time   WBC 12.7 (H) 05/22/2022 0120   RBC 3.31 (L) 05/22/2022 0120   HGB 10.1 (L) 05/22/2022 0120   HCT 29.9 (L) 05/22/2022 0120   PLT 183 05/22/2022 0120   MCV 90.3 05/22/2022 0120   MCH 30.5 05/22/2022 0120   MCHC 33.8 05/22/2022 0120   RDW 16.5 (H) 05/22/2022 0120   LYMPHSABS 1.1 09/24/2015 0858   MONOABS 0.4 09/24/2015 0858   EOSABS 0.0 09/24/2015 0858   BASOSABS 0.0 09/24/2015 0858    Lithium Lvl  Date Value Ref Range Status  02/26/2016 <0.06 (  L) 0.60 - 1.20 mmol/L Final     No results found for: "PHENYTOIN", "PHENOBARB", "VALPROATE", "CBMZ"   .res Assessment: Plan:    Plan:  PDMP reviewed  Increase Seroquel 50mg  to 75mg  at bedtime for sleep  Lamictal 150mg  BID Xanax 1mg  twice daily.  RTC 2 months  Patient advised to contact office with any questions, adverse effects, or acute worsening in signs and symptoms.   Discussed potential benefits, risk, and side effects of benzodiazepines to include potential risk of tolerance and dependence, as well as possible drowsiness. Advised patient not to drive if experiencing drowsiness and to take lowest possible effective dose to minimize risk of dependence and tolerance.   Counseled patient regarding potential benefits, risks, and side effects of Lamictal to include potential risk of Stevens-Johnson syndrome. Advised patient to stop taking Lamictal and contact office immediately if rash develops and to seek urgent medical attention if rash is severe and/or spreading quickly.   There are no diagnoses linked to this encounter.   Please see After Visit Summary for patient specific instructions.  Future Appointments  Date Time Provider Department Center  07/12/2023 10:00 AM Tracia Lacomb, Thereasa Solo, NP CP-[facility] None  07/25/2023  3:40 PM [facility]    No orders of the defined types were placed in this encounter.   -------------------------------

## 2023-07-25 ENCOUNTER — Other Ambulatory Visit: Payer: Medicare Other

## 2023-07-29 DIAGNOSIS — M25551 Pain in right hip: Secondary | ICD-10-CM | POA: Diagnosis not present

## 2023-07-29 DIAGNOSIS — M5459 Other low back pain: Secondary | ICD-10-CM | POA: Diagnosis not present

## 2023-07-29 DIAGNOSIS — M6281 Muscle weakness (generalized): Secondary | ICD-10-CM | POA: Diagnosis not present

## 2023-08-01 DIAGNOSIS — M25551 Pain in right hip: Secondary | ICD-10-CM | POA: Diagnosis not present

## 2023-08-01 DIAGNOSIS — M6281 Muscle weakness (generalized): Secondary | ICD-10-CM | POA: Diagnosis not present

## 2023-08-01 DIAGNOSIS — M5459 Other low back pain: Secondary | ICD-10-CM | POA: Diagnosis not present

## 2023-08-07 ENCOUNTER — Other Ambulatory Visit: Payer: Medicare Other

## 2023-08-07 ENCOUNTER — Ambulatory Visit
Admission: RE | Admit: 2023-08-07 | Discharge: 2023-08-07 | Disposition: A | Discharge status: Home / Self Care | Payer: Medicare Other | Source: Ambulatory Visit | Attending: Orthopedic Surgery | Admitting: Orthopedic Surgery

## 2023-08-07 DIAGNOSIS — M4326 Fusion of spine, lumbar region: Secondary | ICD-10-CM | POA: Diagnosis not present

## 2023-08-07 MED ORDER — GADOPICLENOL 0.5 MMOL/ML IV SOLN
8.0000 mL | Freq: Once | INTRAVENOUS | Status: AC | PRN
  Administered 2023-08-07: 8 mL via INTRAVENOUS

## 2023-08-08 DIAGNOSIS — M6281 Muscle weakness (generalized): Secondary | ICD-10-CM | POA: Diagnosis not present

## 2023-08-08 DIAGNOSIS — M25551 Pain in right hip: Secondary | ICD-10-CM | POA: Diagnosis not present

## 2023-08-08 DIAGNOSIS — M5459 Other low back pain: Secondary | ICD-10-CM | POA: Diagnosis not present

## 2023-08-09 DIAGNOSIS — M4316 Spondylolisthesis, lumbar region: Secondary | ICD-10-CM | POA: Diagnosis not present

## 2023-08-09 DIAGNOSIS — M963 Postlaminectomy kyphosis: Secondary | ICD-10-CM | POA: Diagnosis not present

## 2023-08-12 ENCOUNTER — Other Ambulatory Visit: Payer: Self-pay | Admitting: Orthopedic Surgery

## 2023-08-12 DIAGNOSIS — M4316 Spondylolisthesis, lumbar region: Secondary | ICD-10-CM

## 2023-08-15 ENCOUNTER — Ambulatory Visit
Admission: RE | Admit: 2023-08-15 | Discharge: 2023-08-15 | Disposition: A | Discharge status: Home / Self Care | Payer: Medicare Other | Source: Ambulatory Visit | Attending: Orthopedic Surgery | Admitting: Orthopedic Surgery

## 2023-08-15 DIAGNOSIS — M25551 Pain in right hip: Secondary | ICD-10-CM | POA: Diagnosis not present

## 2023-08-15 DIAGNOSIS — M5459 Other low back pain: Secondary | ICD-10-CM | POA: Diagnosis not present

## 2023-08-15 DIAGNOSIS — M4316 Spondylolisthesis, lumbar region: Secondary | ICD-10-CM

## 2023-08-15 DIAGNOSIS — M6281 Muscle weakness (generalized): Secondary | ICD-10-CM | POA: Diagnosis not present

## 2023-08-23 DIAGNOSIS — M6281 Muscle weakness (generalized): Secondary | ICD-10-CM | POA: Diagnosis not present

## 2023-08-23 DIAGNOSIS — M25551 Pain in right hip: Secondary | ICD-10-CM | POA: Diagnosis not present

## 2023-08-23 DIAGNOSIS — M5459 Other low back pain: Secondary | ICD-10-CM | POA: Diagnosis not present

## 2023-09-02 DIAGNOSIS — M5459 Other low back pain: Secondary | ICD-10-CM | POA: Diagnosis not present

## 2023-09-02 DIAGNOSIS — M6281 Muscle weakness (generalized): Secondary | ICD-10-CM | POA: Diagnosis not present

## 2023-09-02 DIAGNOSIS — M25551 Pain in right hip: Secondary | ICD-10-CM | POA: Diagnosis not present

## 2023-09-03 DIAGNOSIS — Z961 Presence of intraocular lens: Secondary | ICD-10-CM | POA: Diagnosis not present

## 2023-09-04 DIAGNOSIS — M5459 Other low back pain: Secondary | ICD-10-CM | POA: Diagnosis not present

## 2023-09-04 DIAGNOSIS — M25551 Pain in right hip: Secondary | ICD-10-CM | POA: Diagnosis not present

## 2023-09-04 DIAGNOSIS — M6281 Muscle weakness (generalized): Secondary | ICD-10-CM | POA: Diagnosis not present

## 2023-09-05 DIAGNOSIS — M5431 Sciatica, right side: Secondary | ICD-10-CM | POA: Diagnosis not present

## 2023-09-05 DIAGNOSIS — M5432 Sciatica, left side: Secondary | ICD-10-CM | POA: Diagnosis not present

## 2023-09-05 DIAGNOSIS — M4316 Spondylolisthesis, lumbar region: Secondary | ICD-10-CM | POA: Diagnosis not present

## 2023-09-05 DIAGNOSIS — M964 Postsurgical lordosis: Secondary | ICD-10-CM | POA: Diagnosis not present

## 2023-09-06 DIAGNOSIS — Z23 Encounter for immunization: Secondary | ICD-10-CM | POA: Diagnosis not present

## 2023-09-09 DIAGNOSIS — M6281 Muscle weakness (generalized): Secondary | ICD-10-CM | POA: Diagnosis not present

## 2023-09-09 DIAGNOSIS — M5459 Other low back pain: Secondary | ICD-10-CM | POA: Diagnosis not present

## 2023-09-09 DIAGNOSIS — M25551 Pain in right hip: Secondary | ICD-10-CM | POA: Diagnosis not present

## 2023-09-12 DIAGNOSIS — M5459 Other low back pain: Secondary | ICD-10-CM | POA: Diagnosis not present

## 2023-09-12 DIAGNOSIS — M25551 Pain in right hip: Secondary | ICD-10-CM | POA: Diagnosis not present

## 2023-09-12 DIAGNOSIS — M6281 Muscle weakness (generalized): Secondary | ICD-10-CM | POA: Diagnosis not present

## 2023-09-13 ENCOUNTER — Encounter: Payer: Self-pay | Admitting: Adult Health

## 2023-09-13 ENCOUNTER — Ambulatory Visit (INDEPENDENT_AMBULATORY_CARE_PROVIDER_SITE_OTHER): Payer: Medicare Other | Admitting: Adult Health

## 2023-09-13 DIAGNOSIS — F411 Generalized anxiety disorder: Secondary | ICD-10-CM

## 2023-09-13 DIAGNOSIS — F331 Major depressive disorder, recurrent, moderate: Secondary | ICD-10-CM | POA: Diagnosis not present

## 2023-09-13 MED ORDER — ALPRAZOLAM 1 MG PO TABS: 1.0000 mg | ORAL_TABLET | Freq: Two times a day (BID) | ORAL | 2 refills | Status: DC | PRN

## 2023-09-13 MED ORDER — LAMOTRIGINE 150 MG PO TABS
150.0000 mg | ORAL_TABLET | Freq: Two times a day (BID) | ORAL | 5 refills | Status: DC
Start: 2023-09-13 — End: 2024-02-18

## 2023-09-13 MED ORDER — QUETIAPINE FUMARATE 100 MG PO TABS
ORAL_TABLET | ORAL | 5 refills | Status: DC
Start: 2023-09-13 — End: 2024-02-18

## 2023-09-13 NOTE — Progress Notes (Signed)
Carol Duncan [id number] 18-Jan-1951 72 y.o.  Subjective:   Patient ID:  Carol Duncan is a 72 y.o. (DOB 01/06/1951) female.  Chief Complaint: No chief complaint on file.   HPI Carol Duncan presents to the office today for follow-up of MDD and GAD.     Describes mood today as "ok". Pleasant. Denies tearfulness. Mood symptoms - reports depression and anxiety. Reports irritability at times. Reports panic attacks. Reports worry, rumination, and over thinking. Reports obsessive thoughts and acts. Reports situational stressors - medical. Mood is lower. Stating "I feel like the medications are helpful". Would like to increase dose of Seroquel from 75mg  to 100mg  daily. Varying interest and motivation. Taking medications as prescribed.  Energy levels stable. Active, does not have a regular exercise routine with physical limitations. Enjoys some usual interests and activities. Divorced. Lives with a roommate. Spending time with family. Appetite adequate. Reports weight loss. Sleeps well most nights. Averages 4 to 5 hours. Denies daytime napping. Focus and concentration stable - taking 20mg  of Adderall daily. Completing tasks. Managing aspects of household. Retired. Denies SI or HI.  Denies AH or VH. Denies self harm. Denies substance use.   Therapist - Meredeth Ide  Previous medication trials: Wellbutrin, Lithium,others   Flowsheet Row ED to Hosp-Admission (Discharged) from 05/21/2022 in [facility]  SURGICAL  C-SSRS RISK CATEGORY No Risk        Review of Systems:  Review of Systems  Musculoskeletal:  Negative for gait problem.  Neurological:  Negative for tremors.  Psychiatric/Behavioral:         Please refer to HPI    Medications: I have reviewed the patient's current medications.  Current Outpatient Medications  Medication Sig Dispense Refill   ALPRAZolam (XANAX) 1 MG tablet Take 1 tablet (1 mg total) by mouth 2  (two) times daily as needed for anxiety. 60 tablet 2   cetirizine (ZYRTEC ALLERGY) 10 MG tablet Take 1 tablet (10 mg total) by mouth daily for 7 days. 7 tablet 0   doxycycline (VIBRAMYCIN) 100 MG capsule Take 100 mg by mouth 2 (two) times daily as needed.     EPINEPHrine 0.3 mg/0.3 mL IJ SOAJ injection Inject 0.3 mg into the muscle as needed for anaphylaxis. 1 each 0   EPINEPHrine 0.3 mg/0.3 mL IJ SOAJ injection Inject 0.3 mg into the muscle as needed for anaphylaxis. Call EMS if you use this. 2 each 1   estradiol (ESTRACE) 1 MG tablet Take 1 mg by mouth daily.     famotidine (PEPCID) 20 MG tablet Take 1 tablet (20 mg total) by mouth 2 (two) times daily for 7 days. 14 tablet 0   HYDROcodone-acetaminophen (NORCO) 10-325 MG per tablet Take 1-2 tablets by mouth every 6 (six) hours as needed for severe pain (takes one-half tablet).      lamoTRIgine (LAMICTAL) 150 MG tablet Take 1 tablet (150 mg total) by mouth 2 (two) times daily. 60 tablet 5   levothyroxine (SYNTHROID) 50 MCG tablet Take 50 mcg by mouth every morning.     naproxen (NAPROSYN) 500 MG tablet Take 500 mg by mouth 2 (two) times daily as needed for mild pain.     pregabalin (LYRICA) 75 MG capsule Take 75 mg by mouth 2 (two) times daily.     QUEtiapine (SEROQUEL) 25 MG tablet Take 1 tablet (25 mg total) by mouth at bedtime. 30 tablet 5   QUEtiapine (SEROQUEL) 50 MG tablet Take one tablet at bedtime. 30 tablet  5   tiZANidine (ZANAFLEX) 4 MG tablet Take 4 mg by mouth every 6 (six) hours as needed for muscle spasms.     valACYclovir (VALTREX) 500 MG tablet Take 500 mg by mouth daily. As needed for outbreaks     valsartan-hydrochlorothiazide (DIOVAN-HCT) 80-12.5 MG tablet Take 1 tablet by mouth daily.     No current facility-administered medications for this visit.    Medication Side Effects: None  Allergies:  Allergies  Allergen Reactions   Shellfish Allergy     Shrimp ok,  Scallops, clams, oysters give angioedema  Other  Reaction(s): Not available   Wheat Other (See Comments) and Nausea And Vomiting    Upset stomach    Past Medical History:  Diagnosis Date   Anxiety    Bile duct abnormality    Complication of anesthesia    Takes alot to keep patient under anesthesia   Diverticulitis    Hypertension     Past Medical History, Surgical history, Social history, and Family history were reviewed and updated as appropriate.   Please see review of systems for further details on the patient's review from today.   Objective:   Physical Exam:  There were no vitals taken for this visit.  Physical Exam Constitutional:      General: She is not in acute distress. Musculoskeletal:        General: No deformity.  Neurological:     Mental Status: She is alert and oriented to person, place, and time.     Coordination: Coordination normal.  Psychiatric:        Attention and Perception: Attention and perception normal. She does not perceive auditory or visual hallucinations.        Mood and Affect: Affect is not labile, blunt, angry or inappropriate.        Speech: Speech normal.        Behavior: Behavior normal.        Thought Content: Thought content normal. Thought content is not paranoid or delusional. Thought content does not include homicidal or suicidal ideation. Thought content does not include homicidal or suicidal plan.        Cognition and Memory: Cognition and memory normal.        Judgment: Judgment normal.     Comments: Insight intact     Lab Review:     Component Value Date/Time   NA 139 05/22/2022 0120   K 4.4 05/22/2022 0120   CL 114 (H) 05/22/2022 0120   CO2 20 (L) 05/22/2022 0120   GLUCOSE 108 (H) 05/22/2022 0120   BUN 18 05/22/2022 0120   CREATININE 1.13 (H) 05/22/2022 0120   CALCIUM 9.0 05/22/2022 0120   PROT 5.2 (L) 05/22/2022 0120   ALBUMIN 2.8 (L) 05/22/2022 0120   AST 16 05/22/2022 0120   ALT 13 05/22/2022 0120   ALKPHOS 27 (L) 05/22/2022 0120   BILITOT 0.6 05/22/2022  0120   GFRNONAA 52 (L) 05/22/2022 0120   GFRAA >60 12/21/2017 0923       Component Value Date/Time   WBC 12.7 (H) 05/22/2022 0120   RBC 3.31 (L) 05/22/2022 0120   HGB 10.1 (L) 05/22/2022 0120   HCT 29.9 (L) 05/22/2022 0120   PLT 183 05/22/2022 0120   MCV 90.3 05/22/2022 0120   MCH 30.5 05/22/2022 0120   MCHC 33.8 05/22/2022 0120   RDW 16.5 (H) 05/22/2022 0120   LYMPHSABS 1.1 09/24/2015 0858   MONOABS 0.4 09/24/2015 0858   EOSABS 0.0 09/24/2015 0858   BASOSABS  0.0 09/24/2015 0858    Lithium Lvl  Date Value Ref Range Status  02/26/2016 <0.06 (L) 0.60 - 1.20 mmol/L Final     No results found for: "PHENYTOIN", "PHENOBARB", "VALPROATE", "CBMZ"   .res Assessment: Plan:    Plan:  PDMP reviewed  Increase Seroquel 75mg  to 100mg  at bedtime for sleep  Lamictal 150mg  BID Xanax 1mg  twice daily.  RTC 2 months  Patient advised to contact office with any questions, adverse effects, or acute worsening in signs and symptoms.   Discussed potential benefits, risk, and side effects of benzodiazepines to include potential risk of tolerance and dependence, as well as possible drowsiness. Advised patient not to drive if experiencing drowsiness and to take lowest possible effective dose to minimize risk of dependence and tolerance.   Counseled patient regarding potential benefits, risks, and side effects of Lamictal to include potential risk of Stevens-Johnson syndrome. Advised patient to stop taking Lamictal and contact office immediately if rash develops and to seek urgent medical attention if rash is severe and/or spreading quickly.  There are no diagnoses linked to this encounter.   Please see After Visit Summary for patient specific instructions.  Future Appointments  Date Time Provider Department Center  09/13/2023  1:00 PM Cailean Heacock, Carol Solo, NP CP-CP None    No orders of the defined types were placed in this encounter.   -------------------------------

## 2023-09-16 DIAGNOSIS — M25551 Pain in right hip: Secondary | ICD-10-CM | POA: Diagnosis not present

## 2023-09-16 DIAGNOSIS — M5459 Other low back pain: Secondary | ICD-10-CM | POA: Diagnosis not present

## 2023-09-16 DIAGNOSIS — M6281 Muscle weakness (generalized): Secondary | ICD-10-CM | POA: Diagnosis not present

## 2023-09-19 DIAGNOSIS — M4316 Spondylolisthesis, lumbar region: Secondary | ICD-10-CM | POA: Diagnosis not present

## 2023-09-19 DIAGNOSIS — Z6832 Body mass index (BMI) 32.0-32.9, adult: Secondary | ICD-10-CM | POA: Diagnosis not present

## 2023-09-23 DIAGNOSIS — M25551 Pain in right hip: Secondary | ICD-10-CM | POA: Diagnosis not present

## 2023-09-23 DIAGNOSIS — M5459 Other low back pain: Secondary | ICD-10-CM | POA: Diagnosis not present

## 2023-09-23 DIAGNOSIS — M6281 Muscle weakness (generalized): Secondary | ICD-10-CM | POA: Diagnosis not present

## 2023-09-24 ENCOUNTER — Other Ambulatory Visit: Payer: Self-pay | Admitting: Neurosurgery

## 2023-09-26 ENCOUNTER — Other Ambulatory Visit: Payer: Self-pay | Admitting: Adult Health

## 2023-09-26 DIAGNOSIS — F331 Major depressive disorder, recurrent, moderate: Secondary | ICD-10-CM

## 2023-10-07 DIAGNOSIS — M5459 Other low back pain: Secondary | ICD-10-CM | POA: Diagnosis not present

## 2023-10-07 DIAGNOSIS — M6281 Muscle weakness (generalized): Secondary | ICD-10-CM | POA: Diagnosis not present

## 2023-10-07 DIAGNOSIS — M25551 Pain in right hip: Secondary | ICD-10-CM | POA: Diagnosis not present

## 2023-10-14 DIAGNOSIS — M25551 Pain in right hip: Secondary | ICD-10-CM | POA: Diagnosis not present

## 2023-10-14 DIAGNOSIS — M5459 Other low back pain: Secondary | ICD-10-CM | POA: Diagnosis not present

## 2023-10-14 DIAGNOSIS — M6281 Muscle weakness (generalized): Secondary | ICD-10-CM | POA: Diagnosis not present

## 2023-10-15 DIAGNOSIS — M4316 Spondylolisthesis, lumbar region: Secondary | ICD-10-CM | POA: Diagnosis not present

## 2023-10-15 DIAGNOSIS — Z6832 Body mass index (BMI) 32.0-32.9, adult: Secondary | ICD-10-CM | POA: Diagnosis not present

## 2023-10-17 ENCOUNTER — Other Ambulatory Visit: Payer: Self-pay | Admitting: Medical Genetics

## 2023-10-17 DIAGNOSIS — Z006 Encounter for examination for normal comparison and control in clinical research program: Secondary | ICD-10-CM

## 2023-10-18 HISTORY — PX: LUMBAR FUSION: SHX111

## 2023-10-18 NOTE — Pre-Procedure Instructions (Signed)
Surgical Instructions   Your procedure is scheduled on October 25, 2023. Report to [facility] Main Entrance "A" at 9:20 A.M., then check in with the Admitting office. Any questions or running late day of surgery: call [phone]  Questions prior to your surgery date: call [phone], Monday-Friday, 8am-4pm. If you experience any cold or flu symptoms such as cough, fever, chills, shortness of breath, etc. between now and your scheduled surgery, please notify us at the above number.     Remember:  Do not eat after midnight the night before your surgery  You may drink clear liquids until 8:20 AM the morning of your surgery.   Clear liquids allowed are: Water, Non-Citrus Juices (without pulp), Carbonated Beverages, Clear Tea, Black Coffee Only (NO MILK, CREAM OR POWDERED CREAMER of any kind), and Gatorade.    Take these medicines the morning of surgery with A SIP OF WATER: cetirizine (ZYRTEC ALLERGY)  estradiol (ESTRACE)  famotidine (PEPCID)  lamoTRIgine (LAMICTAL)  levothyroxine (SYNTHROID)  pregabalin (LYRICA)    May take these medicines IF NEEDED: ALPRAZolam (XANAX)  doxycycline (VIBRAMYCIN)  EPINEPHrine Pen HYDROcodone-acetaminophen (NORCO)  tiZANidine (ZANAFLEX)  valACYclovir (VALTREX)    One week prior to surgery, STOP taking any Aspirin (unless otherwise instructed by your surgeon) Aleve, Naproxen, Ibuprofen, Motrin, Advil, Goody's, BC's, all herbal medications, fish oil, and non-prescription vitamins.                     Do NOT Smoke (Tobacco/Vaping) for 24 hours prior to your procedure.  If you use a CPAP at night, you may bring your mask/headgear for your overnight stay.   You will be asked to remove any contacts, glasses, piercing's, hearing aid's, dentures/partials prior to surgery. Please bring cases for these items if needed.    Patients discharged the day of surgery will not be allowed to drive home, and someone needs to stay with them for 24  hours.  SURGICAL WAITING ROOM VISITATION Patients may have no more than 2 support people in the waiting area - these visitors may rotate.   Pre-op nurse will coordinate an appropriate time for 1 ADULT support person, who may not rotate, to accompany patient in pre-op.  Children under the age of 42 must have an adult with them who is not the patient and must remain in the main waiting area with an adult.  If the patient needs to stay at the hospital during part of their recovery, the visitor guidelines for inpatient rooms apply.  Please refer to the [facility] website for the visitor guidelines for any additional information.   If you received a COVID test during your pre-op visit  it is requested that you wear a mask when out in public, stay away from anyone that may not be feeling well and notify your surgeon if you develop symptoms. If you have been in contact with anyone that has tested positive in the last 10 days please notify you surgeon.      Pre-operative 5 CHG Bathing Instructions   You can play a key role in reducing the risk of infection after surgery. Your skin needs to be as free of germs as possible. You can reduce the number of germs on your skin by washing with CHG (chlorhexidine gluconate) soap before surgery. CHG is an antiseptic soap that kills germs and continues to kill germs even after washing.   DO NOT use if you have an allergy to chlorhexidine/CHG or antibacterial soaps. If your skin becomes reddened  or irritated, stop using the CHG and notify one of our RNs at [phone].   Please shower with the CHG soap starting 4 days before surgery using the following schedule:     Please keep in mind the following:  DO NOT shave, including legs and underarms, starting the day of your first shower.   You may shave your face at any point before/day of surgery.  Place clean sheets on your bed the day you start using CHG soap. Use a clean washcloth (not used since being  washed) for each shower. DO NOT sleep with pets once you start using the CHG.   CHG Shower Instructions:  Wash your face and private area with normal soap. If you choose to wash your hair, wash first with your normal shampoo.  After you use shampoo/soap, rinse your hair and body thoroughly to remove shampoo/soap residue.  Turn the water OFF and apply about 3 tablespoons (45 ml) of CHG soap to a CLEAN washcloth.  Apply CHG soap ONLY FROM YOUR NECK DOWN TO YOUR TOES (washing for 3-5 minutes)  DO NOT use CHG soap on face, private areas, open wounds, or sores.  Pay special attention to the area where your surgery is being performed.  If you are having back surgery, having someone wash your back for you may be helpful. Wait 2 minutes after CHG soap is applied, then you may rinse off the CHG soap.  Pat dry with a clean towel  Put on clean clothes/pajamas   If you choose to wear lotion, please use ONLY the CHG-compatible lotions on the back of this paper.   Additional instructions for the day of surgery: DO NOT APPLY any lotions, deodorants, cologne, or perfumes.   Do not bring valuables to the hospital. [facility] is not responsible for any belongings/valuables. Do not wear nail polish, gel polish, artificial nails, or any other type of covering on natural nails (fingers and toes) Do not wear jewelry or makeup Put on clean/comfortable clothes.  Please brush your teeth.  Ask your nurse before applying any prescription medications to the skin.     CHG Compatible Lotions   Aveeno Moisturizing lotion  Cetaphil Moisturizing Cream  Cetaphil Moisturizing Lotion  Clairol Herbal Essence Moisturizing Lotion, Dry Skin  Clairol Herbal Essence Moisturizing Lotion, Extra Dry Skin  Clairol Herbal Essence Moisturizing Lotion, Normal Skin  Curel Age Defying Therapeutic Moisturizing Lotion with Alpha Hydroxy  Curel Extreme Care Body Lotion  Curel Soothing Hands Moisturizing Hand Lotion  Curel  Therapeutic Moisturizing Cream, Fragrance-Free  Curel Therapeutic Moisturizing Lotion, Fragrance-Free  Curel Therapeutic Moisturizing Lotion, Original Formula  Eucerin Daily Replenishing Lotion  Eucerin Dry Skin Therapy Plus Alpha Hydroxy Crme  Eucerin Dry Skin Therapy Plus Alpha Hydroxy Lotion  Eucerin Original Crme  Eucerin Original Lotion  Eucerin Plus Crme Eucerin Plus Lotion  Eucerin TriLipid Replenishing Lotion  Keri Anti-Bacterial Hand Lotion  Keri Deep Conditioning Original Lotion Dry Skin Formula Softly Scented  Keri Deep Conditioning Original Lotion, Fragrance Free Sensitive Skin Formula  Keri Lotion Fast Absorbing Fragrance Free Sensitive Skin Formula  Keri Lotion Fast Absorbing Softly Scented Dry Skin Formula  Keri Original Lotion  Keri Skin Renewal Lotion Keri Silky Smooth Lotion  Keri Silky Smooth Sensitive Skin Lotion  Nivea Body Creamy Conditioning Oil  Nivea Body Extra Enriched Teacher, adult education Moisturizing Lotion Nivea Crme  Nivea Skin Firming Lotion  NutraDerm 30 Skin Lotion  NutraDerm Skin Lotion  NutraDerm Therapeutic  Skin Cream  NutraDerm Therapeutic Skin Lotion  ProShield Protective Hand Cream  Provon moisturizing lotion  Please read over the following fact sheets that you were given.

## 2023-10-21 ENCOUNTER — Other Ambulatory Visit: Payer: Self-pay

## 2023-10-21 ENCOUNTER — Encounter (HOSPITAL_COMMUNITY)
Admission: RE | Admit: 2023-10-21 | Discharge: 2023-10-21 | Disposition: A | Discharge status: Home / Self Care | Payer: Medicare Other | Source: Ambulatory Visit | Attending: Neurosurgery | Admitting: Neurosurgery

## 2023-10-21 ENCOUNTER — Encounter (HOSPITAL_COMMUNITY): Payer: Self-pay

## 2023-10-21 VITALS — BP 150/78 | HR 69 | Temp 98.0°F | Resp 17 | Ht 61.0 in | Wt 172.9 lb

## 2023-10-21 DIAGNOSIS — Z01818 Encounter for other preprocedural examination: Secondary | ICD-10-CM | POA: Diagnosis not present

## 2023-10-21 HISTORY — DX: Anemia, unspecified: D64.9

## 2023-10-21 HISTORY — DX: Hypothyroidism, unspecified: E03.9

## 2023-10-21 HISTORY — DX: Depression, unspecified: F32.A

## 2023-10-21 HISTORY — DX: Attention-deficit hyperactivity disorder, unspecified type: F90.9

## 2023-10-21 HISTORY — DX: Unspecified osteoarthritis, unspecified site: M19.90

## 2023-10-21 HISTORY — DX: Fibromyalgia: M79.7

## 2023-10-21 LAB — CBC
HCT: 40.5 % (ref 36.0–46.0)
Hemoglobin: 13.3 g/dL (ref 12.0–15.0)
MCH: 31.4 pg (ref 26.0–34.0)
MCHC: 32.8 g/dL (ref 30.0–36.0)
MCV: 95.5 fL (ref 80.0–100.0)
Platelets: 232 10*3/uL (ref 150–400)
RBC: 4.24 MIL/uL (ref 3.87–5.11)
RDW: 13.1 % (ref 11.5–15.5)
WBC: 6.9 10*3/uL (ref 4.0–10.5)
nRBC: 0 % (ref 0.0–0.2)

## 2023-10-21 LAB — BASIC METABOLIC PANEL
Anion gap: 14 (ref 5–15)
BUN: 19 mg/dL (ref 8–23)
CO2: 23 mmol/L (ref 22–32)
Calcium: 10 mg/dL (ref 8.9–10.3)
Chloride: 103 mmol/L (ref 98–111)
Creatinine, Ser: 1.06 mg/dL — ABNORMAL HIGH (ref 0.44–1.00)
GFR, Estimated: 56 mL/min — ABNORMAL LOW (ref >60)
Glucose, Bld: 122 mg/dL — ABNORMAL HIGH (ref 70–99)
Potassium: 4.1 mmol/L (ref 3.5–5.1)
Sodium: 140 mmol/L (ref 135–145)

## 2023-10-21 LAB — GLUCOSE, CAPILLARY: Glucose-Capillary: 92 mg/dL (ref 70–99)

## 2023-10-21 LAB — TYPE AND SCREEN
ABO/RH(D): O POS
Antibody Screen: NEGATIVE

## 2023-10-21 LAB — SURGICAL PCR SCREEN
MRSA, PCR: NEGATIVE
Staphylococcus aureus: NEGATIVE

## 2023-10-21 NOTE — Progress Notes (Signed)
Surgical Instructions    Your procedure is scheduled on October 25, 2023.  Report to [facility] Main Entrance "A" at 9:20  A.M., then check in with the Admitting office.  Call this number if you have problems the morning of surgery:  [phone]  If you have any questions prior to your surgery date call [phone]: Open Monday-Friday 8am-4pm If you experience any cold or flu symptoms such as cough, fever, chills, shortness of breath, etc. between now and your scheduled surgery, please notify us at the above number.     Remember:  Do not eat after midnight the night before your surgery  You may drink clear liquids until 8:20 the morning of your surgery.   Clear liquids allowed are: Water, Non-Citrus Juices (without pulp), Carbonated Beverages, Clear Tea, Black Coffee Only (NO MILK, CREAM OR POWDERED CREAMER of any kind), and Gatorade.    Take these medicines the morning of surgery with A SIP OF WATER  cetirizine (ZYRTEC ALLERGY)   famotidine (PEPCID)  estradiol (ESTRACE)  lamoTRIgine (LAMICTAL)   levothyroxine (SYNTHROID)  pregabalin (LYRICA)    IF NEEDED ALPRAZolam (XANAX)  doxycycline (VIBRAMYCIN) ? EPINEPHrine 0.3 mg/0.3 mL IJ SOAJ injection  HYDROcodone-acetaminophen (NORCO)  tiZANidine (ZANAFLEX  valACYclovir (VALTREX)    As of today, STOP taking any Aspirin (unless otherwise instructed by your surgeon) Aleve, Naproxen, Ibuprofen, Motrin, Advil, Goody's, BC's, all herbal medications, fish oil, and all vitamins. THIS INCLUDES YOUR naproxen (NAPROSYN).                     Do NOT Smoke (Tobacco/Vaping) for 24 hours prior to your procedure.  If you use a CPAP at night, you may bring your mask/headgear for your overnight stay.   Contacts, glasses, piercing's, hearing aid's, dentures or partials may not be worn into surgery, please bring cases for these belongings.    For patients admitted to the hospital, discharge time will be determined by your treatment team.    Patients discharged the day of surgery will not be allowed to drive home, and someone needs to stay with them for 24 hours.  SURGICAL WAITING ROOM VISITATION Patients having surgery or a procedure may have no more than 2 support people in the waiting area - these visitors may rotate.   Children under the age of 16 must have an adult with them who is not the patient. If the patient needs to stay at the hospital during part of their recovery, the visitor guidelines for inpatient rooms apply. Pre-op nurse will coordinate an appropriate time for 1 support person to accompany patient in pre-op.  This support person may not rotate.   Please refer to the [facility] website for the visitor guidelines for Inpatients (after your surgery is over and you are in a regular room).    Special instructions:   [facility]- Preparing For Surgery  Before surgery, you can play an important role. Because skin is not sterile, your skin needs to be as free of germs as possible. You can reduce the number of germs on your skin by washing with CHG (chlorahexidine gluconate) Soap before surgery.  CHG is an antiseptic cleaner which kills germs and bonds with the skin to continue killing germs even after washing.    Oral Hygiene is also important to reduce your risk of infection.  Remember - BRUSH YOUR TEETH THE MORNING OF SURGERY WITH YOUR REGULAR TOOTHPASTE  Please do not use if you have an allergy to CHG or antibacterial soaps.  If your skin becomes reddened/irritated stop using the CHG.  Do not shave (including legs and underarms) for at least 48 hours prior to first CHG shower. It is OK to shave your face.  Please follow these instructions carefully.   Shower the NIGHT BEFORE SURGERY and the MORNING OF SURGERY  If you chose to wash your hair, wash your hair first as usual with your normal shampoo.  After you shampoo, rinse your hair and body thoroughly to remove the shampoo.  Use CHG Soap as you would any other  liquid soap. You can apply CHG directly to the skin and wash gently with a scrungie or a clean washcloth.   Apply the CHG Soap to your body ONLY FROM THE NECK DOWN.  Do not use on open wounds or open sores. Avoid contact with your eyes, ears, mouth and genitals (private parts). Wash Face and genitals (private parts)  with your normal soap.   Wash thoroughly, paying special attention to the area where your surgery will be performed.  Thoroughly rinse your body with warm water from the neck down.  DO NOT shower/wash with your normal soap after using and rinsing off the CHG Soap.  Pat yourself dry with a CLEAN TOWEL.  Wear CLEAN PAJAMAS to bed the night before surgery  Place CLEAN SHEETS on your bed the night before your surgery  DO NOT SLEEP WITH PETS.   Day of Surgery: Take a shower with CHG soap. Do not wear jewelry or makeup Do not wear lotions, powders, perfumes/colognes, or deodorant. Do not shave 48 hours prior to surgery.  Men may shave face and neck. Do not bring valuables to the hospital.  [facility] is not responsible for any belongings or valuables. Do not wear nail polish, gel polish, artificial nails, or any other type of covering on natural nails (fingers and toes) If you have artificial nails or gel coating that need to be removed by a nail salon, please have this removed prior to surgery. Artificial nails or gel coating may interfere with anesthesia's ability to adequately monitor your vital signs. Wear Clean/Comfortable clothing the morning of surgery Remember to brush your teeth WITH YOUR REGULAR TOOTHPASTE.   Please read over the following fact sheets that you were given.    If you received a COVID test during your pre-op visit  it is requested that you wear a mask when out in public, stay away from anyone that may not be feeling well and notify your surgeon if you develop symptoms. If you have been in contact with anyone that has tested positive in the last 10  days please notify you surgeon.

## 2023-10-21 NOTE — Progress Notes (Signed)
PCP - Hillard Danker  Cardiologist - denies  PPM/ICD - denies Device Orders - n/a Rep Notified - n/a  Chest x-ray - denies EKG - 10-21-23 Stress Test - denies ECHO - denies Cardiac Cath - denies  Sleep Study - denies CPAP - n/a  DM denies  Blood Thinner Instructions: denies Aspirin Instructions:n/a  ERAS Protcol - clear liquids until 8:20   COVID TEST- no   Anesthesia review: no  Patient denies shortness of breath, fever, cough and chest pain at PAT appointment   All instructions explained to the patient, with a verbal understanding of the material. Patient agrees to go over the instructions while at home for a better understanding. Patient also instructed to self quarantine after being tested for COVID-19. The opportunity to ask questions was provided.

## 2023-10-25 ENCOUNTER — Ambulatory Visit (HOSPITAL_COMMUNITY): Payer: Medicare Other

## 2023-10-25 ENCOUNTER — Ambulatory Visit (HOSPITAL_COMMUNITY): Payer: Medicare Other | Admitting: Physician Assistant

## 2023-10-25 ENCOUNTER — Observation Stay (HOSPITAL_COMMUNITY)
Admission: RE | Admit: 2023-10-25 | Discharge: 2023-10-26 | Disposition: A | Discharge status: Home / Self Care | Payer: Medicare Other | Attending: Neurosurgery | Admitting: Neurosurgery

## 2023-10-25 ENCOUNTER — Ambulatory Visit (HOSPITAL_BASED_OUTPATIENT_CLINIC_OR_DEPARTMENT_OTHER): Payer: Medicare Other | Admitting: Anesthesiology

## 2023-10-25 ENCOUNTER — Encounter (HOSPITAL_COMMUNITY)
Admission: RE | Disposition: A | Discharge status: Home / Self Care | Payer: Self-pay | Source: Home / Self Care | Attending: Neurosurgery

## 2023-10-25 DIAGNOSIS — I10 Essential (primary) hypertension: Secondary | ICD-10-CM | POA: Insufficient documentation

## 2023-10-25 DIAGNOSIS — M4807 Spinal stenosis, lumbosacral region: Secondary | ICD-10-CM | POA: Insufficient documentation

## 2023-10-25 DIAGNOSIS — R2689 Other abnormalities of gait and mobility: Secondary | ICD-10-CM | POA: Diagnosis not present

## 2023-10-25 DIAGNOSIS — M4316 Spondylolisthesis, lumbar region: Secondary | ICD-10-CM | POA: Diagnosis not present

## 2023-10-25 DIAGNOSIS — E039 Hypothyroidism, unspecified: Secondary | ICD-10-CM | POA: Diagnosis not present

## 2023-10-25 DIAGNOSIS — Z981 Arthrodesis status: Secondary | ICD-10-CM | POA: Diagnosis not present

## 2023-10-25 DIAGNOSIS — M5117 Intervertebral disc disorders with radiculopathy, lumbosacral region: Secondary | ICD-10-CM | POA: Diagnosis not present

## 2023-10-25 DIAGNOSIS — Z79899 Other long term (current) drug therapy: Secondary | ICD-10-CM | POA: Diagnosis not present

## 2023-10-25 DIAGNOSIS — M4317 Spondylolisthesis, lumbosacral region: Principal | ICD-10-CM

## 2023-10-25 LAB — ABO/RH: ABO/RH(D): O POS

## 2023-10-25 SURGERY — POSTERIOR LUMBAR FUSION 1 LEVEL
Anesthesia: General | Site: Back

## 2023-10-25 MED ORDER — MIDAZOLAM HCL 2 MG/2ML IJ SOLN
INTRAMUSCULAR | Status: DC | PRN
  Administered 2023-10-25: 2 mg via INTRAVENOUS

## 2023-10-25 MED ORDER — LIDOCAINE 2% (20 MG/ML) 5 ML SYRINGE
INTRAMUSCULAR | Status: AC
  Filled 2023-10-25: qty 5

## 2023-10-25 MED ORDER — ROCURONIUM BROMIDE 10 MG/ML (PF) SYRINGE
PREFILLED_SYRINGE | INTRAVENOUS | Status: AC
  Filled 2023-10-25: qty 10

## 2023-10-25 MED ORDER — CEFAZOLIN SODIUM-DEXTROSE 2-4 GM/100ML-% IV SOLN
INTRAVENOUS | Status: AC
  Filled 2023-10-25: qty 100

## 2023-10-25 MED ORDER — THROMBIN 20000 UNITS EX SOLR
CUTANEOUS | Status: AC
  Filled 2023-10-25: qty 20000

## 2023-10-25 MED ORDER — FENTANYL CITRATE (PF) 100 MCG/2ML IJ SOLN
INTRAMUSCULAR | Status: AC
  Filled 2023-10-25: qty 2

## 2023-10-25 MED ORDER — SUCCINYLCHOLINE CHLORIDE 200 MG/10ML IV SOSY
PREFILLED_SYRINGE | INTRAVENOUS | Status: AC
  Filled 2023-10-25: qty 10

## 2023-10-25 MED ORDER — DEXAMETHASONE SODIUM PHOSPHATE 10 MG/ML IJ SOLN
INTRAMUSCULAR | Status: DC | PRN
  Administered 2023-10-25: 10 mg via INTRAVENOUS

## 2023-10-25 MED ORDER — FENTANYL CITRATE (PF) 100 MCG/2ML IJ SOLN
25.0000 ug | INTRAMUSCULAR | Status: DC | PRN
  Administered 2023-10-25 (×3): 50 ug via INTRAVENOUS

## 2023-10-25 MED ORDER — CEFAZOLIN SODIUM-DEXTROSE 2-4 GM/100ML-% IV SOLN
2.0000 g | INTRAVENOUS | Status: AC
Start: 2023-10-25 — End: 2023-10-25
  Administered 2023-10-25: 2 g via INTRAVENOUS

## 2023-10-25 MED ORDER — ALPRAZOLAM 0.5 MG PO TABS: 1.0000 mg | ORAL_TABLET | Freq: Two times a day (BID) | ORAL | Status: DC | PRN

## 2023-10-25 MED ORDER — FENTANYL CITRATE (PF) 250 MCG/5ML IJ SOLN
INTRAMUSCULAR | Status: AC
  Filled 2023-10-25: qty 5

## 2023-10-25 MED ORDER — CHLORHEXIDINE GLUCONATE CLOTH 2 % EX PADS: 6.0000 | MEDICATED_PAD | Freq: Once | CUTANEOUS | Status: DC

## 2023-10-25 MED ORDER — ESTRADIOL 0.5 MG PO TABS
1.0000 mg | ORAL_TABLET | Freq: Every day | ORAL | Status: DC
  Administered 2023-10-25: 1 mg via ORAL
  Filled 2023-10-25 (×2): qty 2

## 2023-10-25 MED ORDER — ONDANSETRON HCL 4 MG/2ML IJ SOLN: 4.0000 mg | Freq: Four times a day (QID) | INTRAMUSCULAR | Status: DC | PRN

## 2023-10-25 MED ORDER — CEFAZOLIN SODIUM-DEXTROSE 1-4 GM/50ML-% IV SOLN
1.0000 g | Freq: Three times a day (TID) | INTRAVENOUS | Status: AC
  Administered 2023-10-25 – 2023-10-26 (×2): 1 g via INTRAVENOUS
  Filled 2023-10-25 (×2): qty 50

## 2023-10-25 MED ORDER — HYDROCODONE-ACETAMINOPHEN 10-325 MG PO TABS: 1.0000 | ORAL_TABLET | ORAL | Status: DC | PRN

## 2023-10-25 MED ORDER — SODIUM CHLORIDE 0.9% FLUSH
3.0000 mL | Freq: Two times a day (BID) | INTRAVENOUS | Status: DC
  Administered 2023-10-25: 3 mL via INTRAVENOUS

## 2023-10-25 MED ORDER — DEXMEDETOMIDINE HCL IN NACL 200 MCG/50ML IV SOLN
INTRAVENOUS | Status: DC | PRN
  Administered 2023-10-25 (×3): 8 ug via INTRAVENOUS
  Administered 2023-10-25: 4 ug via INTRAVENOUS

## 2023-10-25 MED ORDER — BUPIVACAINE HCL (PF) 0.25 % IJ SOLN
INTRAMUSCULAR | Status: AC
  Filled 2023-10-25: qty 30

## 2023-10-25 MED ORDER — AMPHETAMINE-DEXTROAMPHETAMINE 10 MG PO TABS: 10.0000 mg | ORAL_TABLET | Freq: Every day | ORAL | Status: DC

## 2023-10-25 MED ORDER — SUGAMMADEX SODIUM 200 MG/2ML IV SOLN
INTRAVENOUS | Status: DC | PRN
  Administered 2023-10-25: 200 mg via INTRAVENOUS

## 2023-10-25 MED ORDER — LEVOTHYROXINE SODIUM 25 MCG PO TABS
50.0000 ug | ORAL_TABLET | Freq: Every morning | ORAL | Status: DC
  Administered 2023-10-26: 50 ug via ORAL
  Filled 2023-10-25: qty 2

## 2023-10-25 MED ORDER — THROMBIN 20000 UNITS EX SOLR
CUTANEOUS | Status: DC | PRN
  Administered 2023-10-25: 20 mL via TOPICAL

## 2023-10-25 MED ORDER — ORAL CARE MOUTH RINSE: 15.0000 mL | Freq: Once | OROMUCOSAL | Status: AC

## 2023-10-25 MED ORDER — FENTANYL CITRATE (PF) 250 MCG/5ML IJ SOLN
INTRAMUSCULAR | Status: DC | PRN
  Administered 2023-10-25 (×3): 50 ug via INTRAVENOUS
  Administered 2023-10-25: 100 ug via INTRAVENOUS

## 2023-10-25 MED ORDER — CHLORHEXIDINE GLUCONATE 0.12 % MT SOLN
OROMUCOSAL | Status: AC
  Administered 2023-10-25: 15 mL via OROMUCOSAL
  Filled 2023-10-25: qty 15

## 2023-10-25 MED ORDER — MENTHOL 3 MG MT LOZG: 1.0000 | LOZENGE | OROMUCOSAL | Status: DC | PRN

## 2023-10-25 MED ORDER — PHENYLEPHRINE 80 MCG/ML (10ML) SYRINGE FOR IV PUSH (FOR BLOOD PRESSURE SUPPORT)
PREFILLED_SYRINGE | INTRAVENOUS | Status: DC | PRN
  Administered 2023-10-25 (×4): 80 ug via INTRAVENOUS

## 2023-10-25 MED ORDER — ACETAMINOPHEN 325 MG PO TABS: 650.0000 mg | ORAL_TABLET | ORAL | Status: DC | PRN

## 2023-10-25 MED ORDER — PHENOL 1.4 % MT LIQD: 1.0000 | OROMUCOSAL | Status: DC | PRN

## 2023-10-25 MED ORDER — BUPIVACAINE HCL (PF) 0.25 % IJ SOLN
INTRAMUSCULAR | Status: DC | PRN
  Administered 2023-10-25: 20 mL

## 2023-10-25 MED ORDER — HYDROMORPHONE HCL 1 MG/ML IJ SOLN
INTRAMUSCULAR | Status: AC
  Filled 2023-10-25: qty 1

## 2023-10-25 MED ORDER — SUCCINYLCHOLINE CHLORIDE 200 MG/10ML IV SOSY
PREFILLED_SYRINGE | INTRAVENOUS | Status: DC | PRN
  Administered 2023-10-25: 100 mg via INTRAVENOUS

## 2023-10-25 MED ORDER — FLEET ENEMA RE ENEM: 1.0000 | ENEMA | Freq: Once | RECTAL | Status: DC | PRN

## 2023-10-25 MED ORDER — OXYCODONE HCL 5 MG/5ML PO SOLN: 5.0000 mg | Freq: Once | ORAL | Status: DC | PRN

## 2023-10-25 MED ORDER — EPHEDRINE 5 MG/ML INJ
INTRAVENOUS | Status: AC
  Filled 2023-10-25: qty 5

## 2023-10-25 MED ORDER — POLYETHYLENE GLYCOL 3350 17 G PO PACK
17.0000 g | PACK | Freq: Every day | ORAL | Status: DC | PRN
  Administered 2023-10-26: 17 g via ORAL
  Filled 2023-10-25: qty 1

## 2023-10-25 MED ORDER — ONDANSETRON HCL 4 MG/2ML IJ SOLN
INTRAMUSCULAR | Status: DC | PRN
  Administered 2023-10-25: 4 mg via INTRAVENOUS

## 2023-10-25 MED ORDER — 0.9 % SODIUM CHLORIDE (POUR BTL) OPTIME
TOPICAL | Status: DC | PRN
  Administered 2023-10-25: 1000 mL

## 2023-10-25 MED ORDER — DEXAMETHASONE SODIUM PHOSPHATE 10 MG/ML IJ SOLN
INTRAMUSCULAR | Status: AC
  Filled 2023-10-25: qty 1

## 2023-10-25 MED ORDER — MIDAZOLAM HCL 2 MG/2ML IJ SOLN
INTRAMUSCULAR | Status: AC
  Filled 2023-10-25: qty 2

## 2023-10-25 MED ORDER — SODIUM CHLORIDE 0.9% FLUSH
3.0000 mL | INTRAVENOUS | Status: DC | PRN
Start: 2023-10-25 — End: 2023-10-26

## 2023-10-25 MED ORDER — EPHEDRINE SULFATE-NACL 50-0.9 MG/10ML-% IV SOSY
PREFILLED_SYRINGE | INTRAVENOUS | Status: DC | PRN
  Administered 2023-10-25 (×5): 5 mg via INTRAVENOUS

## 2023-10-25 MED ORDER — TIZANIDINE HCL 4 MG PO TABS
8.0000 mg | ORAL_TABLET | Freq: Every day | ORAL | Status: DC
  Administered 2023-10-25: 8 mg via ORAL
  Filled 2023-10-25: qty 2

## 2023-10-25 MED ORDER — PROPOFOL 10 MG/ML IV BOLUS
INTRAVENOUS | Status: DC | PRN
  Administered 2023-10-25: 50 mg via INTRAVENOUS
  Administered 2023-10-25: 150 mg via INTRAVENOUS
  Administered 2023-10-25: 50 mg via INTRAVENOUS

## 2023-10-25 MED ORDER — SODIUM CHLORIDE 0.9 % IV SOLN
250.0000 mL | INTRAVENOUS | Status: DC
  Administered 2023-10-25: 250 mL via INTRAVENOUS

## 2023-10-25 MED ORDER — LAMOTRIGINE 100 MG PO TABS
150.0000 mg | ORAL_TABLET | Freq: Every day | ORAL | Status: DC
  Administered 2023-10-26: 150 mg via ORAL
  Filled 2023-10-25: qty 2

## 2023-10-25 MED ORDER — ONDANSETRON HCL 4 MG/2ML IJ SOLN
INTRAMUSCULAR | Status: AC
  Filled 2023-10-25: qty 2

## 2023-10-25 MED ORDER — OXYCODONE HCL 5 MG PO TABS: 5.0000 mg | ORAL_TABLET | Freq: Once | ORAL | Status: DC | PRN

## 2023-10-25 MED ORDER — PROPOFOL 10 MG/ML IV BOLUS
INTRAVENOUS | Status: AC
  Filled 2023-10-25: qty 20

## 2023-10-25 MED ORDER — VANCOMYCIN HCL 1000 MG IV SOLR
INTRAVENOUS | Status: DC | PRN
  Administered 2023-10-25: 1000 mg

## 2023-10-25 MED ORDER — VANCOMYCIN HCL 1000 MG IV SOLR
INTRAVENOUS | Status: AC
  Filled 2023-10-25: qty 20

## 2023-10-25 MED ORDER — HEMOSTATIC AGENTS (NO CHARGE) OPTIME
TOPICAL | Status: DC | PRN
  Administered 2023-10-25: 1 via TOPICAL

## 2023-10-25 MED ORDER — HYDROMORPHONE HCL 1 MG/ML IJ SOLN
1.0000 mg | INTRAMUSCULAR | Status: DC | PRN
  Administered 2023-10-25 – 2023-10-26 (×2): 1 mg via INTRAVENOUS
  Filled 2023-10-25 (×3): qty 1

## 2023-10-25 MED ORDER — PREGABALIN 75 MG PO CAPS
75.0000 mg | ORAL_CAPSULE | Freq: Two times a day (BID) | ORAL | Status: DC
  Administered 2023-10-25 – 2023-10-26 (×2): 75 mg via ORAL
  Filled 2023-10-25 (×2): qty 1

## 2023-10-25 MED ORDER — PHENYLEPHRINE 80 MCG/ML (10ML) SYRINGE FOR IV PUSH (FOR BLOOD PRESSURE SUPPORT)
PREFILLED_SYRINGE | INTRAVENOUS | Status: AC
  Filled 2023-10-25: qty 10

## 2023-10-25 MED ORDER — CHLORHEXIDINE GLUCONATE CLOTH 2 % EX PADS
6.0000 | MEDICATED_PAD | Freq: Once | CUTANEOUS | Status: DC
Start: 2023-10-25 — End: 2023-10-25

## 2023-10-25 MED ORDER — HYDROMORPHONE HCL 1 MG/ML IJ SOLN
0.2500 mg | INTRAMUSCULAR | Status: DC | PRN
  Administered 2023-10-25 (×2): 0.5 mg via INTRAVENOUS

## 2023-10-25 MED ORDER — VALSARTAN-HYDROCHLOROTHIAZIDE 80-12.5 MG PO TABS: 1.0000 | ORAL_TABLET | Freq: Every day | ORAL | Status: DC

## 2023-10-25 MED ORDER — IRBESARTAN 75 MG PO TABS
75.0000 mg | ORAL_TABLET | Freq: Every day | ORAL | Status: DC
  Administered 2023-10-26: 75 mg via ORAL
  Filled 2023-10-25: qty 1

## 2023-10-25 MED ORDER — CHLORHEXIDINE GLUCONATE 0.12 % MT SOLN: 15.0000 mL | Freq: Once | OROMUCOSAL | Status: AC

## 2023-10-25 MED ORDER — ONDANSETRON HCL 4 MG/2ML IJ SOLN
4.0000 mg | Freq: Four times a day (QID) | INTRAMUSCULAR | Status: DC | PRN
Start: 2023-10-25 — End: 2023-10-26

## 2023-10-25 MED ORDER — ACETAMINOPHEN 650 MG RE SUPP: 650.0000 mg | RECTAL | Status: DC | PRN

## 2023-10-25 MED ORDER — ROCURONIUM BROMIDE 10 MG/ML (PF) SYRINGE
PREFILLED_SYRINGE | INTRAVENOUS | Status: DC | PRN
  Administered 2023-10-25: 70 mg via INTRAVENOUS

## 2023-10-25 MED ORDER — BISACODYL 10 MG RE SUPP: 10.0000 mg | Freq: Every day | RECTAL | Status: DC | PRN

## 2023-10-25 MED ORDER — ONDANSETRON HCL 4 MG PO TABS: 4.0000 mg | ORAL_TABLET | Freq: Four times a day (QID) | ORAL | Status: DC | PRN

## 2023-10-25 MED ORDER — OXYCODONE HCL 5 MG PO TABS
10.0000 mg | ORAL_TABLET | ORAL | Status: DC | PRN
  Administered 2023-10-25 – 2023-10-26 (×5): 10 mg via ORAL
  Filled 2023-10-25 (×5): qty 2

## 2023-10-25 MED ORDER — LACTATED RINGERS IV SOLN
INTRAVENOUS | Status: DC | PRN
Start: 2023-10-25 — End: 2023-10-25

## 2023-10-25 MED ORDER — HYDROCHLOROTHIAZIDE 12.5 MG PO TABS
12.5000 mg | ORAL_TABLET | Freq: Every day | ORAL | Status: DC
  Administered 2023-10-26: 12.5 mg via ORAL
  Filled 2023-10-25: qty 1

## 2023-10-25 MED ORDER — QUETIAPINE FUMARATE 100 MG PO TABS
100.0000 mg | ORAL_TABLET | Freq: Every day | ORAL | Status: DC
  Administered 2023-10-25: 100 mg via ORAL
  Filled 2023-10-25: qty 1

## 2023-10-25 SURGICAL SUPPLY — 61 items
ADH SKN CLS APL DERMABOND .7 (GAUZE/BANDAGES/DRESSINGS) ×1
APL SKNCLS STERI-STRIP NONHPOA (GAUZE/BANDAGES/DRESSINGS) ×1
BAG COUNTER SPONGE SURGICOUNT (BAG) ×1 IMPLANT
BAG SPNG CNTER NS LX DISP (BAG) ×1
BENZOIN TINCTURE PRP APPL 2/3 (GAUZE/BANDAGES/DRESSINGS) ×1 IMPLANT
BLADE BONE MILL MEDIUM (MISCELLANEOUS) ×1 IMPLANT
BLADE CLIPPER SURG (BLADE) IMPLANT
BUR MATCHSTICK NEURO 3.0 LAGG (BURR) ×1 IMPLANT
CAGE INTERBODY PL SHT 7X22.5 (Plate) IMPLANT
CANISTER SUCT 3000ML PPV (MISCELLANEOUS) ×1 IMPLANT
CNTNR URN SCR LID CUP LEK RST (MISCELLANEOUS) ×1 IMPLANT
CONT SPEC 4OZ STRL OR WHT (MISCELLANEOUS) ×1
COVER BACK TABLE 60X90IN (DRAPES) ×1 IMPLANT
DERMABOND ADVANCED .7 DNX12 (GAUZE/BANDAGES/DRESSINGS) ×1 IMPLANT
DRAPE C-ARM 42X72 X-RAY (DRAPES) ×2 IMPLANT
DRAPE HALF SHEET 40X57 (DRAPES) IMPLANT
DRAPE LAPAROTOMY 100X72X124 (DRAPES) ×1 IMPLANT
DRAPE SURG 17X23 STRL (DRAPES) ×4 IMPLANT
DRSG OPSITE POSTOP 4X6 (GAUZE/BANDAGES/DRESSINGS) ×1 IMPLANT
DRSG OPSITE POSTOP 4X8 (GAUZE/BANDAGES/DRESSINGS) IMPLANT
DURAPREP 26ML APPLICATOR (WOUND CARE) ×1 IMPLANT
ELECT REM PT RETURN 9FT ADLT (ELECTROSURGICAL) ×1
ELECTRODE REM PT RTRN 9FT ADLT (ELECTROSURGICAL) ×1 IMPLANT
EVACUATOR 1/8 PVC DRAIN (DRAIN) IMPLANT
GAUZE 4X4 16PLY ~~LOC~~+RFID DBL (SPONGE) IMPLANT
GAUZE SPONGE 4X4 12PLY STRL (GAUZE/BANDAGES/DRESSINGS) IMPLANT
GLOVE BIO SURGEON STRL SZ 6.5 (GLOVE) ×1 IMPLANT
GLOVE BIOGEL PI IND STRL 6.5 (GLOVE) ×1 IMPLANT
GLOVE ECLIPSE 9.0 STRL (GLOVE) ×2 IMPLANT
GLOVE EXAM NITRILE XL STR (GLOVE) IMPLANT
GOWN STRL REUS W/ TWL LRG LVL3 (GOWN DISPOSABLE) IMPLANT
GOWN STRL REUS W/ TWL XL LVL3 (GOWN DISPOSABLE) ×2 IMPLANT
GOWN STRL REUS W/TWL 2XL LVL3 (GOWN DISPOSABLE) IMPLANT
GOWN STRL REUS W/TWL LRG LVL3 (GOWN DISPOSABLE) ×1
GOWN STRL REUS W/TWL XL LVL3 (GOWN DISPOSABLE) ×4
KIT BASIN OR (CUSTOM PROCEDURE TRAY) ×1 IMPLANT
KIT TURNOVER KIT B (KITS) ×1 IMPLANT
NDL HYPO 22X1.5 SAFETY MO (MISCELLANEOUS) ×1 IMPLANT
NEEDLE HYPO 22X1.5 SAFETY MO (MISCELLANEOUS) ×1 IMPLANT
NS IRRIG 1000ML POUR BTL (IV SOLUTION) ×1 IMPLANT
PACK LAMINECTOMY NEURO (CUSTOM PROCEDURE TRAY) ×1 IMPLANT
POWDER SURGICEL 3.0 GRAM (HEMOSTASIS) IMPLANT
PUTTY GRAFTON DBF 6CC W/DELIVE (Putty) IMPLANT
ROD PED SOLERA 5.5X60 (Rod) IMPLANT
ROD SOLERA 80MM (Rod) ×1 IMPLANT
ROD SOLERA 80X5.5XNS TI (Rod) IMPLANT
SCREW SET SOLERA (Screw) ×6 IMPLANT
SCREW SET SOLERA TI5.5 (Screw) IMPLANT
SCREW SOLERA 30X6.5XMA NS SPNE (Screw) IMPLANT
SCREW SOLERA 6.5X30MM (Screw) ×2 IMPLANT
SPIKE FLUID TRANSFER (MISCELLANEOUS) ×1 IMPLANT
SPONGE SURGIFOAM ABS GEL 100 (HEMOSTASIS) ×1 IMPLANT
STRIP CLOSURE SKIN 1/2X4 (GAUZE/BANDAGES/DRESSINGS) ×2 IMPLANT
SUT VIC AB 0 CT1 18XCR BRD8 (SUTURE) ×2 IMPLANT
SUT VIC AB 0 CT1 8-18 (SUTURE) ×2
SUT VIC AB 2-0 CT1 18 (SUTURE) ×1 IMPLANT
SUT VIC AB 3-0 SH 8-18 (SUTURE) ×2 IMPLANT
TOWEL GREEN STERILE (TOWEL DISPOSABLE) ×1 IMPLANT
TOWEL GREEN STERILE FF (TOWEL DISPOSABLE) ×1 IMPLANT
TRAY FOLEY MTR SLVR 16FR STAT (SET/KITS/TRAYS/PACK) ×1 IMPLANT
WATER STERILE IRR 1000ML POUR (IV SOLUTION) ×1 IMPLANT

## 2023-10-25 NOTE — OR Nursing (Signed)
Dr Jordan Likes made aware of patient allergy to beef and pork and surgifoam 100 open to field also aware alternative med available in room

## 2023-10-25 NOTE — Transfer of Care (Signed)
Immediate Anesthesia Transfer of Care Note  Patient: Carol Duncan  Procedure(s) Performed: Posterior Lumbar Interbody Fusion - Lumbar five-Sacral one (Back)  Patient Location: PACU  Anesthesia Type:General  Level of Consciousness: awake, alert , oriented, and patient cooperative  Airway & Oxygen Therapy: Patient Spontanous Breathing  Post-op Assessment: Report given to RN, Post -op Vital signs reviewed and stable, and Patient moving all extremities  Post vital signs: Reviewed and stable  Last Vitals:  Vitals Value Taken Time  BP 142/78 10/25/23 1537  Temp    Pulse 71 10/25/23 1545  Resp 13 10/25/23 1545  SpO2 91 % 10/25/23 1545  Vitals shown include unfiled device data.  Last Pain:  Vitals:   10/25/23 0949  TempSrc:   PainSc: 6       Patients Stated Pain Goal: 3 (10/25/23 0949)  Complications: There were no known notable events for this encounter.

## 2023-10-25 NOTE — Brief Op Note (Signed)
10/25/2023  3:20 PM  PATIENT:  Greg Cutter  72 y.o. female  PRE-OPERATIVE DIAGNOSIS:  Spondylolisthesis  POST-OPERATIVE DIAGNOSIS:  Spondylolisthesis  PROCEDURE:  Procedure(s): Posterior Lumbar Interbody Fusion - Lumbar five-Sacral one (N/A)  SURGEON:  Surgeons and Role:    Julio Sicks, MD - Primary  PHYSICIAN ASSISTANT:   ASSISTANTSMarland Mcalpine   ANESTHESIA:   general  EBL:  60 mL   BLOOD ADMINISTERED:none  DRAINS: none   LOCAL MEDICATIONS USED:  MARCAINE     SPECIMEN:  No Specimen  DISPOSITION OF SPECIMEN:  N/A  COUNTS:  YES  TOURNIQUET:  * No tourniquets in log *  DICTATION: .Dragon Dictation  PLAN OF CARE: Admit for overnight observation  PATIENT DISPOSITION:  PACU - hemodynamically stable.   Delay start of Pharmacological VTE agent (>24hrs) due to surgical blood loss or risk of bleeding: yes

## 2023-10-25 NOTE — Anesthesia Procedure Notes (Signed)
Procedure Name: Intubation Date/Time: 10/25/2023 12:30 PM  Performed by: Gloris Ham, CRNAPre-anesthesia Checklist: Patient identified, Emergency Drugs available, Suction available and Patient being monitored Patient Re-evaluated:Patient Re-evaluated prior to induction Oxygen Delivery Method: Circle System Utilized Preoxygenation: Pre-oxygenation with 100% oxygen Induction Type: IV induction Ventilation: Mask ventilation without difficulty Laryngoscope Size: Miller and 1 Grade View: Grade II Tube type: Oral Tube size: 7.0 mm Number of attempts: 1 Airway Equipment and Method: Stylet and Oral airway Placement Confirmation: ETT inserted through vocal cords under direct vision, positive ETCO2 and breath sounds checked- equal and bilateral Secured at: 19 cm Tube secured with: Tape Dental Injury: Teeth and Oropharynx as per pre-operative assessment

## 2023-10-25 NOTE — H&P (Signed)
Carol Duncan is an 72 y.o. female.   Chief Complaint: Back pain HPI: 72 year old female status post prior L4-5 decompression and fusion done by outside physician.  She initially did well after surgery but developed severe back pain with radiating pain into both lower extremities.  The patient has been discovered to have spondylolysis at L5 with a unstable spondylolisthesis at L5-S1.  The patient has failed conservative management.  She presents now for decompression and fusion at L5-S1 in hopes of improving her symptoms.  Past Medical History:  Diagnosis Date   ADHD (attention deficit hyperactivity disorder)    Anemia    Anxiety    Arthritis    Bile duct abnormality    Complication of anesthesia    Takes alot to keep patient under anesthesia   Depression    Diverticulitis    Fibromyalgia    Hypertension    Hypothyroidism     Past Surgical History:  Procedure Laterality Date   ABDOMINAL HYSTERECTOMY     APPENDECTOMY     during hysterectomy   CHOLECYSTECTOMY N/A 02/27/2016   Procedure: LAPAROSCOPIC CHOLECYSTECTOMY WITH INTRAOPERATIVE CHOLANGIOGRAM;  Surgeon: Romie Levee, MD;  Location: [facility];  Service: General;  Laterality: N/A;   myomectomy      Family History  Problem Relation Age of Onset   Brain cancer Mother    Heart attack Father    Social History:  reports that she has never smoked. She has never used smokeless tobacco. She reports current alcohol use. She reports that she does not use drugs.  Allergies:  Allergies  Allergen Reactions   Shellfish Allergy     Shrimp ok,  Scallops, clams, oysters give angioedema  Other Reaction(s): Not available   Beef-Derived Products    Lambs Quarters    Pork-Derived Products    Wheat Other (See Comments) and Nausea And Vomiting    Upset stomach    Medications Prior to Admission  Medication Sig Dispense Refill   ADDERALL 20 MG tablet Take 10 mg by mouth daily.     ALPRAZolam (XANAX) 1 MG tablet Take 1 tablet  (1 mg total) by mouth 2 (two) times daily as needed for anxiety. 60 tablet 2   estradiol (ESTRACE) 1 MG tablet Take 1 mg by mouth daily.     HYDROcodone-acetaminophen (NORCO) 10-325 MG per tablet Take 1 tablet by mouth every 4 (four) hours as needed for severe pain (pain score 7-10) (takes one-half tablet).     lamoTRIgine (LAMICTAL) 150 MG tablet Take 1 tablet (150 mg total) by mouth 2 (two) times daily. (Patient taking differently: Take 150 mg by mouth daily.) 60 tablet 5   levothyroxine (SYNTHROID) 50 MCG tablet Take 50 mcg by mouth every morning.     naproxen (NAPROSYN) 500 MG tablet Take 500 mg by mouth 2 (two) times daily as needed for mild pain.     pregabalin (LYRICA) 75 MG capsule Take 75 mg by mouth 2 (two) times daily.     QUEtiapine (SEROQUEL) 100 MG tablet Take one tablet at bedtime. 30 tablet 5   tiZANidine (ZANAFLEX) 4 MG tablet Take 8 mg by mouth at bedtime.     valACYclovir (VALTREX) 500 MG tablet Take 500 mg by mouth daily. As needed for outbreaks     EPINEPHrine 0.3 mg/0.3 mL IJ SOAJ injection Inject 0.3 mg into the muscle as needed for anaphylaxis. 1 each 0   EPINEPHrine 0.3 mg/0.3 mL IJ SOAJ injection Inject 0.3 mg into the muscle as needed for  anaphylaxis. Call EMS if you use this. 2 each 1   valsartan-hydrochlorothiazide (DIOVAN-HCT) 80-12.5 MG tablet Take 1 tablet by mouth daily.      Results for orders placed or performed during the hospital encounter of 10/25/23 (from the past 48 hour(s))  ABO/Rh     Status: None (Preliminary result)   Collection Time: 10/25/23  9:50 AM  Result Value Ref Range   ABO/RH(D) PENDING    No results found.  A comprehensive review of systems was negative.  Blood pressure (!) 146/81, pulse 70, temperature 98.4 F (36.9 C), temperature source Oral, resp. rate 18, height 5\' 1"  (1.549 m), weight 77.1 kg, SpO2 97%.  Patient is awake and alert.  She is oriented and appropriate.  Speech is fluent.  Judgment insight are intact.  Cranial nerve  function normal bilaterally motor examination intact bilaterally sensory examination some patchy distal sensory loss in both lower extremities.  Deep tendon reflexes hypoactive but symmetric.  No evidence of long track signs.  Gait antalgic.  Posture mildly flexed peer examination head ears eyes nose and throat is unremarked.  Chest and abdomen are benign.  Extremities are free from injury deformity. Assessment/Plan L5 spondylolysis with L5-S1 lytic spondylolisthesis with back pain and radiculopathy.  Plan L5 Gill procedure with L5 and S1 decompressive foraminotomies followed by posterior lumbar interbody fusion utilizing interbody cages, low curves that autograft, and coupled with posterior lateral arthrodesis utilizing nonsegmental pedicle screw fixation and local autograft.  Risks and benefits of explained.  Patient wishes to proceed.  Sherilyn Cooter A Kalup Jaquith 10/25/2023, 11:17 AM

## 2023-10-25 NOTE — Op Note (Signed)
Date of procedure: 10/25/2023  Date of dictation: Same  Service: Neurosurgery  Preoperative diagnosis: L5-S1 lytic spondylolisthesis with foraminal stenosis and radiculopathy.  Status post prior L4-5 decompression and fusion with instrumentation  Postoperative diagnosis: Same  Procedure Name: Reexploration of L4-5 fusion with removal of hardware  Bilateral L5 Gill procedure with bilateral L5 and S1 foraminotomies  L5-S1 posterior lumbar interbody fusion utilizing interbody cages, locally harvested autograft, and morselized allograft  L5-S1 posterior lateral thesis utilizing L4-S1 pedicle screw instrumentation and local autografting.  Surgeon:Rogue Pautler A.Samaira Holzworth, M.D.  Asst. Surgeon: Doran Durand, NP  Anesthesia: General  Indication: 72 year old female with history of L4-5 decompression and fusion done by another physician a couple years ago.  Patient initially did well after surgery but developed severe back pain with bilateral radicular symptoms.  Workup demonstrates evidence of traumatic spondylolysis at L5 bilaterally with progressive spondylolisthesis and severe neuroforaminal stenosis.  Patient has failed conservative management presents now for decompression and fusion.  Operative note: After induction of anesthesia, patient addition prone onto Wilson frame and properly padded.  Lumbar region prepped and draped sterilely.  Incision made overlying L4-5 and S1.  Dissection performed bilaterally.  Retractor placed.  Fluoroscopy used.  Levels confirmed.  Previously placed pedicle screws potation L4-5 with dissected free, disassembled and the rods were removed.  Screws were found to be well-positioned and intact.  The fusion appeared grossly stable at L4-5.  Attention then placed to the L5-S1 level.  There were fractures along the pars interarticularis and inferior pedicles bilaterally of L5.  The L5 lamina was completely resected using Leksell rongeurs and Kerrison rongeurs.  Complete inferior  facetectomies were performed.  The pars interarticularis extending into the fracture line was removed.  Ligament flavum elevated and resected.  L5 and S1 nerve roots were widely decompressed.  Bilateral discectomy was then performed at L5-S1.  Disc base prepared for interbody fusion.  With the distractor placed patient's right side disc base further cleaned and the endplates curetted.  A 7 mm Medtronic expandable cage was then impacted into place and expanded.  Distractor removed patient's right side.  Disc base prepared on the right side.  Morselized autograft was then packed in the interspace.  Second cage was then impacted in place and expanded.  Pedicles of S1 were identified using surface landmarks and intraoperative fluoroscopy.  Superficial bone around the pedicle was then removed using high-speed drill.  Pedicle was then probed using a pedicle awl.  Pedicle tract was then probed and found to be solidly in the bone.  Each pair all track was then tapped with a screw tap.  Screw table was probed and found to be solidly within the bone.  6.5 mm Medtronic Solera screws were then placed bilaterally at S1.  Final images reveal good position of the cages and the hardware at the proper operative level with normal alignment of the spine.  Short segment titanium rods and placed to the screw heads at L4-L5 and S1.  Locking caps were then placed over the screws.  Locking caps were then engaged with the construct under compression.  Transverse processes and sacral ala were decorticated.  Morselized autograft was packed posterior laterally.  Each cage was then further packed with demineralized bone fibers.  The patient has a mobility and protein allergy and Gelfoam was not used.  The dura was lined with Surgicel powder.  Hemostasis was good.  Vancomycin powder placed in the deep wound space.  Wounds then closed in layers with Vicryl sutures.  Steri-Strips and  sterile dressing were applied.  No apparent complications.   Patient tolerated procedure well and he returns to the recovery room postop.

## 2023-10-25 NOTE — Anesthesia Preprocedure Evaluation (Signed)
Anesthesia Evaluation  Patient identified by MRN, date of birth, ID band Patient awake    Reviewed: Allergy & Precautions, H&P , NPO status , Patient's Chart, lab work & pertinent test results  Airway Mallampati: II   Neck ROM: full    Dental   Pulmonary    breath sounds clear to auscultation       Cardiovascular hypertension,  Rhythm:regular Rate:Normal     Neuro/Psych  PSYCHIATRIC DISORDERS Anxiety Depression    ADHD   GI/Hepatic   Endo/Other  Hypothyroidism    Renal/GU      Musculoskeletal  (+) Arthritis ,  Fibromyalgia -  Abdominal   Peds  Hematology   Anesthesia Other Findings   Reproductive/Obstetrics                             Anesthesia Physical Anesthesia Plan  ASA: 2  Anesthesia Plan: General   Post-op Pain Management:    Induction: Intravenous  PONV Risk Score and Plan: 3 and Ondansetron, Dexamethasone, Midazolam and Treatment may vary due to age or medical condition  Airway Management Planned: Oral ETT  Additional Equipment:   Intra-op Plan:   Post-operative Plan: Extubation in OR  Informed Consent: I have reviewed the patients History and Physical, chart, labs and discussed the procedure including the risks, benefits and alternatives for the proposed anesthesia with the patient or authorized representative who has indicated his/her understanding and acceptance.     Dental advisory given  Plan Discussed with: CRNA, Anesthesiologist and Surgeon  Anesthesia Plan Comments:        Anesthesia Quick Evaluation

## 2023-10-25 NOTE — Progress Notes (Signed)
Orthopedic Tech Progress Note Patient Details:  Carol Duncan [birth year] [id number]  Ortho Devices Type of Ortho Device: Lumbar corsett Ortho Device/Splint Interventions: Ordered  Left at bedside    Tonye Pearson 10/25/2023, 6:44 PM

## 2023-10-25 NOTE — Anesthesia Postprocedure Evaluation (Signed)
Anesthesia Post Note  Patient: Carol Duncan  Procedure(s) Performed: Posterior Lumbar Interbody Fusion - Lumbar five-Sacral one (Back)     Patient location during evaluation: PACU Anesthesia Type: General Level of consciousness: awake and alert and oriented Pain management: pain level controlled Vital Signs Assessment: post-procedure vital signs reviewed and stable Respiratory status: spontaneous breathing, nonlabored ventilation and respiratory function stable Cardiovascular status: blood pressure returned to baseline and stable Postop Assessment: no apparent nausea or vomiting Anesthetic complications: no   There were no known notable events for this encounter.  Last Vitals:  Vitals:   10/25/23 1610 10/25/23 1615  BP:  135/64  Pulse: 67 65  Resp: 12 18  Temp:    SpO2: 93% 95%    Last Pain:  Vitals:   10/25/23 1610  TempSrc:   PainSc: 9                  Carol Sparlin A.

## 2023-10-26 DIAGNOSIS — Z79899 Other long term (current) drug therapy: Secondary | ICD-10-CM | POA: Diagnosis not present

## 2023-10-26 DIAGNOSIS — E039 Hypothyroidism, unspecified: Secondary | ICD-10-CM | POA: Diagnosis not present

## 2023-10-26 DIAGNOSIS — M4807 Spinal stenosis, lumbosacral region: Secondary | ICD-10-CM | POA: Diagnosis not present

## 2023-10-26 DIAGNOSIS — I10 Essential (primary) hypertension: Secondary | ICD-10-CM | POA: Diagnosis not present

## 2023-10-26 DIAGNOSIS — M4317 Spondylolisthesis, lumbosacral region: Secondary | ICD-10-CM | POA: Diagnosis not present

## 2023-10-26 DIAGNOSIS — R2689 Other abnormalities of gait and mobility: Secondary | ICD-10-CM | POA: Diagnosis not present

## 2023-10-26 MED ORDER — HYDROCODONE-ACETAMINOPHEN 10-325 MG PO TABS: 1.0000 | ORAL_TABLET | ORAL | 0 refills | Status: AC | PRN

## 2023-10-26 MED ORDER — TIZANIDINE HCL 4 MG PO TABS: 4.0000 mg | ORAL_TABLET | Freq: Four times a day (QID) | ORAL | 0 refills | Status: AC | PRN

## 2023-10-26 NOTE — Progress Notes (Signed)
Patient alert and oriented, void, ambulate. Surgical site and dressing clean and dry no sign of infection. D/c instructions explain and given to the patient all questions answered.

## 2023-10-26 NOTE — Plan of Care (Signed)
Problem: Education: Goal: Knowledge of General Education information will improve Description: Including pain rating scale, medication(s)/side effects and non-pharmacologic comfort measures Outcome: Progressing   Problem: Health Behavior/Discharge Planning: Goal: Ability to manage health-related needs will improve Outcome: Progressing   Problem: Clinical Measurements: Goal: Ability to maintain clinical measurements within normal limits will improve Outcome: Progressing Goal: Will remain free from infection Outcome: Progressing Goal: Diagnostic test results will improve Outcome: Progressing Goal: Respiratory complications will improve Outcome: Progressing Goal: Cardiovascular complication will be avoided Outcome: Progressing   Problem: Activity: Goal: Risk for activity intolerance will decrease Outcome: Progressing   Problem: Nutrition: Goal: Adequate nutrition will be maintained Outcome: Progressing   Problem: Coping: Goal: Level of anxiety will decrease Outcome: Progressing   Problem: Elimination: Goal: Will not experience complications related to bowel motility Outcome: Progressing Goal: Will not experience complications related to urinary retention Outcome: Progressing   Problem: Pain Management: Goal: General experience of comfort will improve Outcome: Progressing   Problem: Safety: Goal: Ability to remain free from injury will improve Outcome: Progressing   Problem: Skin Integrity: Goal: Risk for impaired skin integrity will decrease Outcome: Progressing   Problem: Education: Goal: Ability to verbalize activity precautions or restrictions will improve Outcome: Progressing Goal: Knowledge of the prescribed therapeutic regimen will improve Outcome: Progressing Goal: Understanding of discharge needs will improve Outcome: Progressing   Problem: Activity: Goal: Ability to avoid complications of mobility impairment will improve Outcome: Progressing Goal:  Ability to tolerate increased activity will improve Outcome: Progressing Goal: Will remain free from falls Outcome: Progressing   Problem: Bowel/Gastric: Goal: Gastrointestinal status for postoperative course will improve Outcome: Progressing   Problem: Clinical Measurements: Goal: Ability to maintain clinical measurements within normal limits will improve Outcome: Progressing Goal: Postoperative complications will be avoided or minimized Outcome: Progressing Goal: Diagnostic test results will improve Outcome: Progressing   Problem: Pain Management: Goal: Pain level will decrease Outcome: Progressing   Problem: Skin Integrity: Goal: Will show signs of wound healing Outcome: Progressing   Problem: Health Behavior/Discharge Planning: Goal: Identification of resources available to assist in meeting health care needs will improve Outcome: Progressing   Problem: Bladder/Genitourinary: Goal: Urinary functional status for postoperative course will improve Outcome: Progressing   Problem: Education: Goal: Knowledge of General Education information will improve Description: Including pain rating scale, medication(s)/side effects and non-pharmacologic comfort measures Outcome: Progressing   Problem: Health Behavior/Discharge Planning: Goal: Ability to manage health-related needs will improve Outcome: Progressing   Problem: Clinical Measurements: Goal: Ability to maintain clinical measurements within normal limits will improve Outcome: Progressing Goal: Will remain free from infection Outcome: Progressing Goal: Diagnostic test results will improve Outcome: Progressing Goal: Respiratory complications will improve Outcome: Progressing Goal: Cardiovascular complication will be avoided Outcome: Progressing   Problem: Activity: Goal: Risk for activity intolerance will decrease Outcome: Progressing   Problem: Nutrition: Goal: Adequate nutrition will be maintained Outcome:  Progressing   Problem: Coping: Goal: Level of anxiety will decrease Outcome: Progressing   Problem: Elimination: Goal: Will not experience complications related to bowel motility Outcome: Progressing Goal: Will not experience complications related to urinary retention Outcome: Progressing   Problem: Pain Management: Goal: General experience of comfort will improve Outcome: Progressing   Problem: Safety: Goal: Ability to remain free from injury will improve Outcome: Progressing   Problem: Skin Integrity: Goal: Risk for impaired skin integrity will decrease Outcome: Progressing   Problem: Education: Goal: Ability to verbalize activity precautions or restrictions will improve Outcome: Progressing Goal: Knowledge of the prescribed therapeutic regimen  will improve Outcome: Progressing Goal: Understanding of discharge needs will improve Outcome: Progressing   Problem: Activity: Goal: Ability to avoid complications of mobility impairment will improve Outcome: Progressing Goal: Ability to tolerate increased activity will improve Outcome: Progressing Goal: Will remain free from falls Outcome: Progressing   Problem: Bowel/Gastric: Goal: Gastrointestinal status for postoperative course will improve Outcome: Progressing   Problem: Clinical Measurements: Goal: Ability to maintain clinical measurements within normal limits will improve Outcome: Progressing Goal: Postoperative complications will be avoided or minimized Outcome: Progressing Goal: Diagnostic test results will improve Outcome: Progressing   Problem: Pain Management: Goal: Pain level will decrease Outcome: Progressing   Problem: Skin Integrity: Goal: Will show signs of wound healing Outcome: Progressing   Problem: Health Behavior/Discharge Planning: Goal: Identification of resources available to assist in meeting health care needs will improve Outcome: Progressing   Problem: Bladder/Genitourinary: Goal:  Urinary functional status for postoperative course will improve Outcome: Progressing   Problem: Education: Goal: Knowledge of General Education information will improve Description: Including pain rating scale, medication(s)/side effects and non-pharmacologic comfort measures Outcome: Progressing   Problem: Health Behavior/Discharge Planning: Goal: Ability to manage health-related needs will improve Outcome: Progressing   Problem: Clinical Measurements: Goal: Ability to maintain clinical measurements within normal limits will improve Outcome: Progressing Goal: Will remain free from infection Outcome: Progressing Goal: Diagnostic test results will improve Outcome: Progressing Goal: Respiratory complications will improve Outcome: Progressing Goal: Cardiovascular complication will be avoided Outcome: Progressing   Problem: Activity: Goal: Risk for activity intolerance will decrease Outcome: Progressing   Problem: Nutrition: Goal: Adequate nutrition will be maintained Outcome: Progressing   Problem: Coping: Goal: Level of anxiety will decrease Outcome: Progressing   Problem: Elimination: Goal: Will not experience complications related to bowel motility Outcome: Progressing Goal: Will not experience complications related to urinary retention Outcome: Progressing   Problem: Pain Management: Goal: General experience of comfort will improve Outcome: Progressing   Problem: Safety: Goal: Ability to remain free from injury will improve Outcome: Progressing   Problem: Skin Integrity: Goal: Risk for impaired skin integrity will decrease Outcome: Progressing   Problem: Education: Goal: Ability to verbalize activity precautions or restrictions will improve Outcome: Progressing Goal: Knowledge of the prescribed therapeutic regimen will improve Outcome: Progressing Goal: Understanding of discharge needs will improve Outcome: Progressing   Problem: Activity: Goal: Ability  to avoid complications of mobility impairment will improve Outcome: Progressing Goal: Ability to tolerate increased activity will improve Outcome: Progressing Goal: Will remain free from falls Outcome: Progressing   Problem: Bowel/Gastric: Goal: Gastrointestinal status for postoperative course will improve Outcome: Progressing   Problem: Clinical Measurements: Goal: Ability to maintain clinical measurements within normal limits will improve Outcome: Progressing Goal: Postoperative complications will be avoided or minimized Outcome: Progressing Goal: Diagnostic test results will improve Outcome: Progressing   Problem: Pain Management: Goal: Pain level will decrease Outcome: Progressing   Problem: Skin Integrity: Goal: Will show signs of wound healing Outcome: Progressing   Problem: Health Behavior/Discharge Planning: Goal: Identification of resources available to assist in meeting health care needs will improve Outcome: Progressing   Problem: Bladder/Genitourinary: Goal: Urinary functional status for postoperative course will improve Outcome: Progressing   Problem: Education: Goal: Knowledge of General Education information will improve Description: Including pain rating scale, medication(s)/side effects and non-pharmacologic comfort measures Outcome: Progressing   Problem: Health Behavior/Discharge Planning: Goal: Ability to manage health-related needs will improve Outcome: Progressing   Problem: Clinical Measurements: Goal: Ability to maintain clinical measurements within normal limits will improve Outcome:  Progressing Goal: Will remain free from infection Outcome: Progressing Goal: Diagnostic test results will improve Outcome: Progressing Goal: Respiratory complications will improve Outcome: Progressing Goal: Cardiovascular complication will be avoided Outcome: Progressing   Problem: Activity: Goal: Risk for activity intolerance will decrease Outcome:  Progressing   Problem: Nutrition: Goal: Adequate nutrition will be maintained Outcome: Progressing   Problem: Coping: Goal: Level of anxiety will decrease Outcome: Progressing   Problem: Elimination: Goal: Will not experience complications related to bowel motility Outcome: Progressing Goal: Will not experience complications related to urinary retention Outcome: Progressing   Problem: Pain Management: Goal: General experience of comfort will improve Outcome: Progressing   Problem: Safety: Goal: Ability to remain free from injury will improve Outcome: Progressing   Problem: Skin Integrity: Goal: Risk for impaired skin integrity will decrease Outcome: Progressing   Problem: Education: Goal: Ability to verbalize activity precautions or restrictions will improve Outcome: Progressing Goal: Knowledge of the prescribed therapeutic regimen will improve Outcome: Progressing Goal: Understanding of discharge needs will improve Outcome: Progressing   Problem: Activity: Goal: Ability to avoid complications of mobility impairment will improve Outcome: Progressing Goal: Ability to tolerate increased activity will improve Outcome: Progressing Goal: Will remain free from falls Outcome: Progressing   Problem: Bowel/Gastric: Goal: Gastrointestinal status for postoperative course will improve Outcome: Progressing   Problem: Clinical Measurements: Goal: Ability to maintain clinical measurements within normal limits will improve Outcome: Progressing Goal: Postoperative complications will be avoided or minimized Outcome: Progressing Goal: Diagnostic test results will improve Outcome: Progressing   Problem: Pain Management: Goal: Pain level will decrease Outcome: Progressing   Problem: Skin Integrity: Goal: Will show signs of wound healing Outcome: Progressing   Problem: Health Behavior/Discharge Planning: Goal: Identification of resources available to assist in meeting  health care needs will improve Outcome: Progressing   Problem: Bladder/Genitourinary: Goal: Urinary functional status for postoperative course will improve Outcome: Progressing

## 2023-10-26 NOTE — Care Management (Signed)
Patient with order to DC to home today. Unit staff to provide DME needed for home.   No HH needs identified Patient will have family/ friends provide transportation home. No other TOC needs identified for DC 

## 2023-10-26 NOTE — Evaluation (Signed)
Occupational Therapy Evaluation/Discharge Patient Details Name: Carol Duncan MRN: [id number] DOB: 1951-06-15 Today's Date: 10/26/2023   History of Present Illness The pt is a 75 to female presenting 11/8 for PLIF L5-S1. PMH includes: prior L4-5 decompression and fusion, ADHD, anemia, anxiety, arthritis, fibromyalgia, and HTN.   Clinical Impression   PTA, pt lives with roommate and typically completely Independent with ADLs, IADLs and mobility without AD. Pt presents now s/p back surgery and familiar with back precautions. Collaborated regarding techniques for dressing, bathing and IADLs. Pt able to return demo ADLs and mobility Independently; feel no concerns w/ pt managing ADLs at home on DC and has needed DME (shower chair). No further skilled OT services needed at this time.        If plan is discharge home, recommend the following: Assist for transportation    Functional Status Assessment  Patient has not had a recent decline in their functional status  Equipment Recommendations  None recommended by OT    Recommendations for Other Services       Precautions / Restrictions Precautions Precautions: Back Required Braces or Orthoses: Spinal Brace Spinal Brace: Lumbar corset;Applied in sitting position Restrictions Weight Bearing Restrictions: No      Mobility Bed Mobility Overal bed mobility: Modified Independent             General bed mobility comments: use of log rolling    Transfers Overall transfer level: Independent Equipment used: None Transfers: Sit to/from Stand                    Balance Overall balance assessment: No apparent balance deficits (not formally assessed)                                         ADL either performed or assessed with clinical judgement   ADL Overall ADL's : Modified independent     Grooming: Modified independent;Standing;Oral care Grooming Details (indicate cue type and reason):  brushing teeth at sink, educated on use of cup to rinse with to avoid bending w/ pt able to return demo without issues         Upper Body Dressing : Modified independent;Standing Upper Body Dressing Details (indicate cue type and reason): LSO brace mgmt Lower Body Dressing: Modified independent;Sitting/lateral leans;Sit to/from stand Lower Body Dressing Details (indicate cue type and reason): sock mgmt; educated on positional strategies with pt able to easily cross LE             Functional mobility during ADLs: Independent General ADL Comments: Collaborated on ADL strategies, use of shower chair and handheld shower head to maximize safety/independence with ADLs. Discussed IADLs w/ pt reporting roommate is assisting with dog care and groceries. [name] roommate inquiring about getting a HH aide for first few days/week to be with pt 8-4. educated that pt moving well and feel she can manage shower and daily tasks without assist; that obtaining HH aide at home may not be immediate and could take a few days - notified RN of pt/roommate inquires     Vision Ability to See in Adequate Light: 0 Adequate Patient Visual Report: No change from baseline Vision Assessment?: No apparent visual deficits     Perception         Praxis         Pertinent Vitals/Pain Pain Assessment Pain Assessment: No/denies pain     Extremity/Trunk  Assessment Upper Extremity Assessment Upper Extremity Assessment: Overall WFL for tasks assessed;Right hand dominant   Lower Extremity Assessment Lower Extremity Assessment: Defer to PT evaluation   Cervical / Trunk Assessment Cervical / Trunk Assessment: Back Surgery   Communication Communication Communication: No apparent difficulties   Cognition Arousal: Alert Behavior During Therapy: WFL for tasks assessed/performed Overall Cognitive Status: Within Functional Limits for tasks assessed                                       General  Comments  Roommate entering at bedside, brought pt's clothes    Exercises     Shoulder Instructions      Home Living Family/patient expects to be discharged to:: Private residence Living Arrangements: Non-relatives/Friends (roommate) Available Help at Discharge: Friend(s);Neighbor;Available PRN/intermittently Type of Home: House Home Access: Stairs to enter [employer] of Steps: 1   Home Layout: One level     Bathroom Shower/Tub: Producer, television/film/video: Standard     Home Equipment: Shower seat;Hand held shower head;Adaptive equipment Adaptive Equipment: Reacher;Sock aid;Long-handled sponge        Prior Functioning/Environment Prior Level of Function : Independent/Modified Independent;Driving             Mobility Comments: no AD for mobility ADLs Comments: Indep with ADLs, IADLs, walks her doodle dog        OT Problem List:        OT Treatment/Interventions:      OT Goals(Current goals can be found in the care plan section) Acute Rehab OT Goals Patient Stated Goal: home soon, be able to shower  OT Frequency:      Co-evaluation              AM-PAC OT "6 Clicks" Daily Activity     Outcome Measure Help from another person eating meals?: None Help from another person taking care of personal grooming?: None Help from another person toileting, which includes using toliet, bedpan, or urinal?: None Help from another person bathing (including washing, rinsing, drying)?: None Help from another person to put on and taking off regular upper body clothing?: None Help from another person to put on and taking off regular lower body clothing?: None 6 Click Score: 24   End of Session Equipment Utilized During Treatment: Back brace Nurse Communication: Mobility status;Other (comment) (inquiries about HH aide)  Activity Tolerance: Patient tolerated treatment well Patient left: with call bell/phone within reach;with family/visitor present;Other  (comment) (standing in room)  OT Visit Diagnosis: Pain Pain - part of body:  (back)                Time: 1610-9604 OT Time Calculation (min): 24 min Charges:  OT General Charges $OT Visit: 1 Visit OT Evaluation $OT Eval Low Complexity: 1 Low  Bradd Canary, OTR/L Acute Rehab Services Office: [phone]   Lorre Munroe 10/26/2023, 9:33 AM

## 2023-10-26 NOTE — Evaluation (Signed)
Physical Therapy Evaluation Patient Details Name: Carol Duncan MRN: [id number] DOB: 20-Jun-1951 Today's Date: 10/26/2023  History of Present Illness  The pt is a 71 to female presenting 11/8 for PLIF L5-S1. PMH includes: prior L4-5 decompression and fusion, ADHD, anemia, anxiety, arthritis, fibromyalgia, and HTN.   Clinical Impression  Pt in bed upon arrival of PT, agreeable to evaluation at this time. Prior to admission the pt was independent with all mobility, walking daily for exercise as tolerated given recent pain. The pt was able to demo great mobility with and without use of RW, no significant instability, buckling, or LOB. She was educated on progressive walking program, spinal precautions, and DME use in the home. Pt verbalized understanding, reports some concerns as last time her pain increased after leaving hospital. Educated on using mobility, position changes, pillow, and increased DME to manage with increased pain, pt verbalized understanding. No further acute PT needs, pt is ready for d/c when medically stable.      If plan is discharge home, recommend the following: A little help with walking and/or transfers;Help with stairs or ramp for entrance;Assist for transportation   Can travel by private vehicle        Equipment Recommendations None recommended by PT  Recommendations for Other Services       Functional Status Assessment Patient has had a recent decline in their functional status and demonstrates the ability to make significant improvements in function in a reasonable and predictable amount of time.     Precautions / Restrictions Precautions Precautions: Back Required Braces or Orthoses: Spinal Brace Spinal Brace: Lumbar corset;Applied in sitting position Restrictions Weight Bearing Restrictions: No      Mobility  Bed Mobility Overal bed mobility: Modified Independent             General bed mobility comments: use of log rolling     Transfers Overall transfer level: Independent Equipment used: None Transfers: Sit to/from Stand Sit to Stand: Supervision           General transfer comment: slow but stable    Ambulation/Gait Ambulation/Gait assistance: Supervision Gait Distance (Feet): 500 Feet Assistive device: Rolling walker (2 wheels), None Gait Pattern/deviations: Step-through pattern, Decreased stride length Gait velocity: decreased Gait velocity interpretation: <1.31 ft/sec, indicative of household ambulator   General Gait Details: slightly slowed, initially with RW but progressed to no UE support with slow but steady gait.     Balance Overall balance assessment: No apparent balance deficits (not formally assessed)                                           Pertinent Vitals/Pain Pain Assessment Pain Assessment: Faces Pain Score: 2  Faces Pain Scale: Hurts a little bit Pain Location: incision Pain Descriptors / Indicators: Grimacing, Discomfort Pain Intervention(s): Limited activity within patient's tolerance, Monitored during session, Repositioned    Home Living Family/patient expects to be discharged to:: Private residence Living Arrangements: Non-relatives/Friends (roommate) Available Help at Discharge: Friend(s);Neighbor;Available PRN/intermittently Type of Home: House Home Access: Stairs to enter Entrance Stairs-Rails: None Entrance Stairs-Number of Steps: 1   Home Layout: One level Home Equipment: Shower seat;Hand held shower head;Adaptive equipment      Prior Function Prior Level of Function : Independent/Modified Independent;Driving             Mobility Comments: no AD for mobility ADLs Comments: Indep with ADLs, IADLs,  walks her doodle dog     Extremity/Trunk Assessment   Upper Extremity Assessment Upper Extremity Assessment: Defer to OT evaluation    Lower Extremity Assessment Lower Extremity Assessment: RLE deficits/detail RLE Deficits /  Details: slightly decreased sensation (improved from before suprgery) and pain in hip RLE: Unable to fully assess due to pain RLE Sensation: decreased light touch RLE Coordination: WNL    Cervical / Trunk Assessment Cervical / Trunk Assessment: Back Surgery  Communication   Communication Communication: No apparent difficulties Cueing Techniques: Verbal cues  Cognition Arousal: Alert Behavior During Therapy: WFL for tasks assessed/performed Overall Cognitive Status: Within Functional Limits for tasks assessed                                          General Comments General comments (skin integrity, edema, etc.): VSS on RA    Exercises     Assessment/Plan    PT Assessment Patient does not need any further PT services  PT Problem List Decreased strength;Decreased activity tolerance;Decreased balance;Decreased mobility       PT Treatment Interventions      PT Goals (Current goals can be found in the Care Plan section)  Acute Rehab PT Goals Patient Stated Goal: return home and to walking for exercise PT Goal Formulation: All assessment and education complete, DC therapy Time For Goal Achievement: 11/09/23 Potential to Achieve Goals: Good     AM-PAC PT "6 Clicks" Mobility  Outcome Measure Help needed turning from your back to your side while in a flat bed without using bedrails?: None Help needed moving from lying on your back to sitting on the side of a flat bed without using bedrails?: None Help needed moving to and from a bed to a chair (including a wheelchair)?: A Little Help needed standing up from a chair using your arms (e.g., wheelchair or bedside chair)?: A Little Help needed to walk in hospital room?: A Little Help needed climbing 3-5 steps with a railing? : A Little 6 Click Score: 20    End of Session Equipment Utilized During Treatment: Back brace;Gait belt Activity Tolerance: Patient tolerated treatment well Patient left: in bed;with call  bell/phone within reach Nurse Communication: Mobility status PT Visit Diagnosis: Unsteadiness on feet (R26.81);Other abnormalities of gait and mobility (R26.89);Pain Pain - part of body:  (back)    Time: 6606-3016 PT Time Calculation (min) (ACUTE ONLY): 26 min   Charges:   PT Evaluation $PT Eval Low Complexity: 1 Low   PT General Charges $$ ACUTE PT VISIT: 1 Visit         Vickki Muff, PT, DPT   Acute Rehabilitation Department Office [phone] Secure Chat Communication Preferred  Ronnie Derby 10/26/2023, 10:07 AM

## 2023-10-26 NOTE — Discharge Summary (Signed)
Physician Discharge Summary  Patient ID: Carol Duncan MRN: [id number] DOB/AGE: 18-Nov-1951 72 y.o.  Admit date: 10/25/2023 Discharge date: 10/26/2023  Admission Diagnoses: Spondylolisthesis with stenosis   Discharge Diagnoses: Same   Discharged Condition: good  Hospital Course: The patient was admitted on 10/25/2023 and taken to the operating room where the patient underwent PLIF. The patient tolerated the procedure well and was taken to the recovery room and then to the floor in stable condition. The hospital course was routine. There were no complications. The wound remained clean dry and intact. Pt had appropriate back soreness. No complaints of leg pain or new N/T/W. The patient remained afebrile with stable vital signs, and tolerated a regular diet. The patient continued to increase activities, and pain was well controlled with oral pain medications.   Consults: None  Significant Diagnostic Studies:  Results for orders placed or performed during the hospital encounter of 10/25/23  ABO/Rh  Result Value Ref Range   ABO/RH(D)      O POS Performed at [facility], [facility], [facility], [address] [place], [place] [address]     DG Lumbar Spine 2-3 Views  Result Date: 10/25/2023 CLINICAL DATA:  Lumbar interbody fusion. EXAM: LUMBAR SPINE - 2-3 VIEW COMPARISON:  CT 08/15/2023 FINDINGS: Two images obtained portably in the operating room via C-arm radiography were submitted. Images show tissue spreaders posterior to the lower lumbar spine and upper sacrum. Pedicle screws have been placed bilateral at L4, L5 and S1 with interbody spacers at L4-5 and L5-S1. IMPRESSION: Intraoperative imaging during lumbar interbody fusion at L4 through S1. Electronically Signed   By: Signa Kell M.D.   On: 10/25/2023 18:59   DG C-Arm 1-60 Min-No Report  Result Date: 10/25/2023 Fluoroscopy was utilized by the requesting physician.  No radiographic interpretation.    Antibiotics:  Anti-infectives (From  admission, onward)    Start     Dose/Rate Route Frequency Ordered Stop   10/25/23 2000  ceFAZolin (ANCEF) IVPB 1 g/50 mL premix        1 g 100 mL/hr over 30 Minutes Intravenous Every 8 hours 10/25/23 1749 10/26/23 0505   10/25/23 1506  vancomycin (VANCOCIN) powder  Status:  Discontinued          As needed 10/25/23 1506 10/25/23 1533   10/25/23 0930  ceFAZolin (ANCEF) IVPB 2g/100 mL premix        2 g 200 mL/hr over 30 Minutes Intravenous On call to O.R. 10/25/23 0922 10/25/23 1230   10/25/23 0926  ceFAZolin (ANCEF) 2-4 GM/100ML-% IVPB       Note to Pharmacy: Gleason, Ginger E: cabinet override      10/25/23 0926 10/25/23 1231       Discharge Exam: Blood pressure 114/62, pulse 65, temperature 98.6 F (37 C), temperature source Oral, resp. rate 19, height 5\' 1"  (1.549 m), weight 77.1 kg, SpO2 100%. Neurologic: Grossly normal Dressing dry  Discharge Medications:   Allergies as of 10/26/2023       Reactions   Shellfish Allergy    Shrimp ok, Scallops, clams, oysters give angioedema Other Reaction(s): Not available   Beef-derived Products    Lambs Quarters    Pork-derived Products    Wheat Other (See Comments), Nausea And Vomiting   Upset stomach        Medication List     TAKE these medications    Adderall 20 MG tablet Generic drug: amphetamine-dextroamphetamine Take 10 mg by mouth daily.   ALPRAZolam 1 MG tablet Commonly known  as: XANAX Take 1 tablet (1 mg total) by mouth 2 (two) times daily as needed for anxiety.   EPINEPHrine 0.3 mg/0.3 mL Soaj injection Commonly known as: EPI-PEN Inject 0.3 mg into the muscle as needed for anaphylaxis.   EPINEPHrine 0.3 mg/0.3 mL Soaj injection Commonly known as: EPI-PEN Inject 0.3 mg into the muscle as needed for anaphylaxis. Call EMS if you use this.   estradiol 1 MG tablet Commonly known as: ESTRACE Take 1 mg by mouth daily.   HYDROcodone-acetaminophen 10-325 MG tablet Commonly known as: NORCO Take 1 tablet by mouth  every 4 (four) hours as needed for severe pain (pain score 7-10) (takes one-half tablet).   lamoTRIgine 150 MG tablet Commonly known as: LAMICTAL Take 1 tablet (150 mg total) by mouth 2 (two) times daily. What changed: when to take this   levothyroxine 50 MCG tablet Commonly known as: SYNTHROID Take 50 mcg by mouth every morning.   naproxen 500 MG tablet Commonly known as: NAPROSYN Take 500 mg by mouth 2 (two) times daily as needed for mild pain.   pregabalin 75 MG capsule Commonly known as: LYRICA Take 75 mg by mouth 2 (two) times daily.   QUEtiapine 100 MG tablet Commonly known as: SEROQUEL Take one tablet at bedtime.   tiZANidine 4 MG tablet Commonly known as: ZANAFLEX Take 1 tablet (4 mg total) by mouth every 6 (six) hours as needed for muscle spasms. What changed:  how much to take when to take this reasons to take this   valACYclovir 500 MG tablet Commonly known as: VALTREX Take 500 mg by mouth daily. As needed for outbreaks   valsartan-hydrochlorothiazide 80-12.5 MG tablet Commonly known as: DIOVAN-HCT Take 1 tablet by mouth daily.               Durable Medical Equipment  (From admission, onward)           Start     Ordered   10/25/23 1750  DME Walker rolling  Once       Question:  Patient needs a walker to treat with the following condition  Answer:  Spondylolisthesis at L5-S1 level   10/25/23 1749   10/25/23 1750  DME 3 n 1  Once        10/25/23 1749            Disposition: Home   Final Dx: Plif  Discharge Instructions      Remove dressing in 72 hours   Complete by: As directed    Call MD for:  difficulty breathing, headache or visual disturbances   Complete by: As directed    Call MD for:  persistant nausea and vomiting   Complete by: As directed    Call MD for:  redness, tenderness, or signs of infection (pain, swelling, redness, odor or green/yellow discharge around incision site)   Complete by: As directed    Call MD for:   severe uncontrolled pain   Complete by: As directed    Call MD for:  temperature >100.4   Complete by: As directed    Diet - low sodium heart healthy   Complete by: As directed    Increase activity slowly   Complete by: As directed           Signed: Tia Alert 10/26/2023, 6:37 AM

## 2023-10-28 ENCOUNTER — Other Ambulatory Visit: Payer: Self-pay

## 2023-11-05 DIAGNOSIS — M4316 Spondylolisthesis, lumbar region: Secondary | ICD-10-CM | POA: Diagnosis not present

## 2023-11-06 MED FILL — Heparin Sodium (Porcine) Inj 1000 Unit/ML: INTRAMUSCULAR | Qty: 30 | Status: AC

## 2023-11-08 ENCOUNTER — Other Ambulatory Visit (HOSPITAL_COMMUNITY): Payer: Self-pay | Admitting: Neurosurgery

## 2023-11-08 ENCOUNTER — Ambulatory Visit (HOSPITAL_COMMUNITY)
Admission: RE | Admit: 2023-11-08 | Discharge: 2023-11-08 | Disposition: A | Discharge status: Home / Self Care | Payer: Medicare Other | Source: Ambulatory Visit | Attending: Neurosurgery | Admitting: Neurosurgery

## 2023-11-08 DIAGNOSIS — M4316 Spondylolisthesis, lumbar region: Secondary | ICD-10-CM | POA: Insufficient documentation

## 2023-11-08 DIAGNOSIS — M47816 Spondylosis without myelopathy or radiculopathy, lumbar region: Secondary | ICD-10-CM | POA: Diagnosis not present

## 2023-11-08 DIAGNOSIS — S3210XA Unspecified fracture of sacrum, initial encounter for closed fracture: Secondary | ICD-10-CM | POA: Diagnosis not present

## 2023-11-08 DIAGNOSIS — M4807 Spinal stenosis, lumbosacral region: Secondary | ICD-10-CM | POA: Diagnosis not present

## 2023-11-08 DIAGNOSIS — M5126 Other intervertebral disc displacement, lumbar region: Secondary | ICD-10-CM | POA: Diagnosis not present

## 2023-11-08 MED ORDER — GADOBUTROL 1 MMOL/ML IV SOLN
7.0000 mL | Freq: Once | INTRAVENOUS | Status: AC | PRN
  Administered 2023-11-08: 7 mL via INTRAVENOUS

## 2023-11-21 DIAGNOSIS — Z9889 Other specified postprocedural states: Secondary | ICD-10-CM | POA: Diagnosis not present

## 2023-11-21 DIAGNOSIS — M8448XD Pathological fracture, other site, subsequent encounter for fracture with routine healing: Secondary | ICD-10-CM | POA: Diagnosis not present

## 2023-11-26 ENCOUNTER — Other Ambulatory Visit (HOSPITAL_COMMUNITY): Payer: Self-pay | Admitting: Neuroradiology

## 2023-11-26 ENCOUNTER — Other Ambulatory Visit: Payer: Self-pay | Admitting: Radiology

## 2023-11-26 ENCOUNTER — Telehealth (HOSPITAL_COMMUNITY): Payer: Self-pay | Admitting: Radiology

## 2023-11-26 DIAGNOSIS — S3210XA Unspecified fracture of sacrum, initial encounter for closed fracture: Secondary | ICD-10-CM

## 2023-11-26 DIAGNOSIS — M8448XA Pathological fracture, other site, initial encounter for fracture: Secondary | ICD-10-CM

## 2023-11-26 NOTE — Telephone Encounter (Signed)
Per Morrie Sheldon with Dr. Ethelene Browns office, pt does NOT need a biopsy at the time of her sacroplasty. JM

## 2023-11-27 ENCOUNTER — Ambulatory Visit (HOSPITAL_COMMUNITY)
Admission: RE | Admit: 2023-11-27 | Discharge: 2023-11-27 | Disposition: A | Discharge status: Home / Self Care | Payer: Medicare Other | Source: Ambulatory Visit | Attending: Neuroradiology | Admitting: Neuroradiology

## 2023-11-27 ENCOUNTER — Encounter (HOSPITAL_COMMUNITY): Payer: Self-pay

## 2023-11-27 ENCOUNTER — Other Ambulatory Visit: Payer: Self-pay

## 2023-11-27 DIAGNOSIS — S3210XA Unspecified fracture of sacrum, initial encounter for closed fracture: Secondary | ICD-10-CM

## 2023-11-27 DIAGNOSIS — Z01818 Encounter for other preprocedural examination: Secondary | ICD-10-CM | POA: Insufficient documentation

## 2023-11-27 DIAGNOSIS — M8448XA Pathological fracture, other site, initial encounter for fracture: Secondary | ICD-10-CM | POA: Diagnosis not present

## 2023-11-27 DIAGNOSIS — Z791 Long term (current) use of non-steroidal anti-inflammatories (NSAID): Secondary | ICD-10-CM | POA: Diagnosis not present

## 2023-11-27 DIAGNOSIS — M8088XA Other osteoporosis with current pathological fracture, vertebra(e), initial encounter for fracture: Secondary | ICD-10-CM | POA: Diagnosis not present

## 2023-11-27 DIAGNOSIS — W19XXXA Unspecified fall, initial encounter: Secondary | ICD-10-CM | POA: Diagnosis not present

## 2023-11-27 HISTORY — PX: IR SACROPLASTY BILATERAL: IMG5561

## 2023-11-27 LAB — CBC
HCT: 36 % (ref 36.0–46.0)
Hemoglobin: 12.3 g/dL (ref 12.0–15.0)
MCH: 31.9 pg (ref 26.0–34.0)
MCHC: 34.2 g/dL (ref 30.0–36.0)
MCV: 93.3 fL (ref 80.0–100.0)
Platelets: 204 10*3/uL (ref 150–400)
RBC: 3.86 MIL/uL — ABNORMAL LOW (ref 3.87–5.11)
RDW: 13.8 % (ref 11.5–15.5)
WBC: 7.9 10*3/uL (ref 4.0–10.5)
nRBC: 0 % (ref 0.0–0.2)

## 2023-11-27 LAB — PROTIME-INR
INR: 1 (ref 0.8–1.2)
Prothrombin Time: 13.6 s (ref 11.4–15.2)

## 2023-11-27 LAB — BASIC METABOLIC PANEL
Anion gap: 13 (ref 5–15)
BUN: 48 mg/dL — ABNORMAL HIGH (ref 8–23)
CO2: 22 mmol/L (ref 22–32)
Calcium: 9.6 mg/dL (ref 8.9–10.3)
Chloride: 102 mmol/L (ref 98–111)
Creatinine, Ser: 1.66 mg/dL — ABNORMAL HIGH (ref 0.44–1.00)
GFR, Estimated: 33 mL/min — ABNORMAL LOW (ref >60)
Glucose, Bld: 107 mg/dL — ABNORMAL HIGH (ref 70–99)
Potassium: 3.4 mmol/L — ABNORMAL LOW (ref 3.5–5.1)
Sodium: 137 mmol/L (ref 135–145)

## 2023-11-27 MED ORDER — FENTANYL CITRATE (PF) 100 MCG/2ML IJ SOLN
INTRAMUSCULAR | Status: AC | PRN
  Administered 2023-11-27: 25 ug via INTRAVENOUS
  Administered 2023-11-27: 50 ug via INTRAVENOUS
  Administered 2023-11-27: 25 ug via INTRAVENOUS
  Administered 2023-11-27: 50 ug via INTRAVENOUS

## 2023-11-27 MED ORDER — ACETAMINOPHEN 325 MG PO TABS
650.0000 mg | ORAL_TABLET | Freq: Four times a day (QID) | ORAL | Status: DC | PRN
  Administered 2023-11-27: 650 mg via ORAL
  Filled 2023-11-27: qty 2

## 2023-11-27 MED ORDER — OXYCODONE HCL 5 MG PO TABS
5.0000 mg | ORAL_TABLET | ORAL | Status: DC | PRN
  Administered 2023-11-27: 5 mg via ORAL
  Filled 2023-11-27: qty 1

## 2023-11-27 MED ORDER — BUPIVACAINE HCL (PF) 0.25 % IJ SOLN
INTRAMUSCULAR | Status: AC
  Filled 2023-11-27: qty 30

## 2023-11-27 MED ORDER — MIDAZOLAM HCL 2 MG/2ML IJ SOLN
INTRAMUSCULAR | Status: AC
  Filled 2023-11-27: qty 2

## 2023-11-27 MED ORDER — LIDOCAINE HCL (PF) 1 % IJ SOLN
30.0000 mL | Freq: Once | INTRAMUSCULAR | Status: AC
  Administered 2023-11-27: 30 mL via INTRADERMAL

## 2023-11-27 MED ORDER — CEFAZOLIN SODIUM-DEXTROSE 2-4 GM/100ML-% IV SOLN
INTRAVENOUS | Status: AC | PRN
  Administered 2023-11-27: 2 g via INTRAVENOUS

## 2023-11-27 MED ORDER — HYDROMORPHONE HCL 1 MG/ML IJ SOLN
INTRAMUSCULAR | Status: AC
  Filled 2023-11-27: qty 1

## 2023-11-27 MED ORDER — MIDAZOLAM HCL 2 MG/2ML IJ SOLN
INTRAMUSCULAR | Status: AC | PRN
  Administered 2023-11-27 (×8): .5 mg via INTRAVENOUS

## 2023-11-27 MED ORDER — CEFAZOLIN SODIUM-DEXTROSE 2-4 GM/100ML-% IV SOLN
INTRAVENOUS | Status: AC
  Filled 2023-11-27: qty 100

## 2023-11-27 MED ORDER — HYDROMORPHONE HCL 1 MG/ML IJ SOLN
INTRAMUSCULAR | Status: AC | PRN
  Administered 2023-11-27: .5 mg via INTRAVENOUS

## 2023-11-27 MED ORDER — SODIUM CHLORIDE 0.9% FLUSH: 10.0000 mL | Freq: Two times a day (BID) | INTRAVENOUS | Status: DC

## 2023-11-27 MED ORDER — FENTANYL CITRATE (PF) 100 MCG/2ML IJ SOLN
INTRAMUSCULAR | Status: AC
  Filled 2023-11-27: qty 2

## 2023-11-27 MED ORDER — BUPIVACAINE HCL 0.25 % IJ SOLN
20.0000 mL | Freq: Once | INTRAMUSCULAR | Status: DC
  Filled 2023-11-27: qty 20

## 2023-11-27 MED ORDER — LIDOCAINE HCL (PF) 1 % IJ SOLN
INTRAMUSCULAR | Status: AC
Start: 2023-11-27 — End: ?
  Filled 2023-11-27: qty 30

## 2023-11-27 MED ORDER — CEFAZOLIN SODIUM-DEXTROSE 2-4 GM/100ML-% IV SOLN: 2.0000 g | INTRAVENOUS | Status: DC

## 2023-11-27 NOTE — Procedures (Signed)
INTERVENTIONAL NEURORADIOLOGY BRIEF POSTPROCEDURE NOTE  FLUOROSCOPY GUIDED BILATERAL SACROPLASTY  Attending physician: Baldemar Lenis, MD  Diagnosis: Sacral insufficiency fractures.  Access site: Percutaneous.  Anesthesia: IR sedation: Moderate sedation.  Medication used: 4 mg Versed IV; 150 mcg Fentanyl IV, 0.5 mg Hydromorphone IV.  Complications: None.  Estimated blood loss: None.  Specimen: None.  Successful and uncomplicated bilateral sacroplasty.  The patient tolerated the procedure well and is in stable condition.

## 2023-11-27 NOTE — H&P (Signed)
Chief Complaint: Patient was seen in consultation today for a bilateral sacroplasty.    at the request of Dr. Julio Sicks  Referring Physician(s): de Marcial Pacas Rodrigues,Katyucia  Supervising Physician: Baldemar Lenis  Patient Status: [facility] - Out-pt  Full Code   History of Present Illness: Carol Duncan is a 72 y.o. female who presents today for a bilateral sacroplasty. Patient reports falling on her bottom in the kitchen after having a spinal surgery. This event occurred two weeks ago. She reports continuous pain. The patient reports she has been in bed since her injury. Patient is not able to complete her normal daily activities.   Past Medical History:  Diagnosis Date   ADHD (attention deficit hyperactivity disorder)    Anemia    Anxiety    Arthritis    Bile duct abnormality    Complication of anesthesia    Takes alot to keep patient under anesthesia   Depression    Diverticulitis    Fibromyalgia    Hypertension    Hypothyroidism     Past Surgical History:  Procedure Laterality Date   ABDOMINAL HYSTERECTOMY     APPENDECTOMY     during hysterectomy   CHOLECYSTECTOMY N/A 02/27/2016   Procedure: LAPAROSCOPIC CHOLECYSTECTOMY WITH INTRAOPERATIVE CHOLANGIOGRAM;  Surgeon: Romie Levee, MD;  Location: [facility] ORS;  Service: General;  Laterality: N/A;   myomectomy      Allergies: Shellfish allergy, Beef-derived drug products, Lambs quarters, Pork-derived products, and Wheat  Medications: Prior to Admission medications   Medication Sig Start Date End Date Taking? Authorizing Provider  ADDERALL 20 MG tablet Take 10 mg by mouth daily. 08/21/23  Yes [provider]  ALPRAZolam Prudy Feeler) 1 MG tablet Take 1 tablet (1 mg total) by mouth 2 (two) times daily as needed for anxiety. 09/13/23  Yes Mozingo, Thereasa Solo, NP  estradiol (ESTRACE) 1 MG tablet Take 1 mg by mouth daily. 04/15/21  Yes [provider]  HYDROcodone-acetaminophen (NORCO)  10-325 MG tablet Take 1 tablet by mouth every 4 (four) hours as needed for severe pain (pain score 7-10) (takes one-half tablet). 10/26/23  Yes Arman Bogus, MD  lamoTRIgine (LAMICTAL) 150 MG tablet Take 1 tablet (150 mg total) by mouth 2 (two) times daily. Patient taking differently: Take 150 mg by mouth daily. 09/13/23  Yes Mozingo, Thereasa Solo, NP  levothyroxine (SYNTHROID) 50 MCG tablet Take 50 mcg by mouth every morning. 02/12/21  Yes [provider]  pregabalin (LYRICA) 75 MG capsule Take 75 mg by mouth 2 (two) times daily.   Yes [provider]  QUEtiapine (SEROQUEL) 100 MG tablet Take one tablet at bedtime. 09/13/23  Yes Mozingo, Thereasa Solo, NP  tiZANidine (ZANAFLEX) 4 MG tablet Take 1 tablet (4 mg total) by mouth every 6 (six) hours as needed for muscle spasms. 10/26/23  Yes Arman Bogus, MD  valACYclovir (VALTREX) 500 MG tablet Take 500 mg by mouth daily. As needed for outbreaks 09/23/15  Yes [provider]  valsartan-hydrochlorothiazide (DIOVAN-HCT) 80-12.5 MG tablet Take 1 tablet by mouth daily.   Yes [provider]  EPINEPHrine 0.3 mg/0.3 mL IJ SOAJ injection Inject 0.3 mg into the muscle as needed for anaphylaxis. 05/21/22   Zadie Rhine, MD  EPINEPHrine 0.3 mg/0.3 mL IJ SOAJ injection Inject 0.3 mg into the muscle as needed for anaphylaxis. Call EMS if you use this. 05/22/22   Zigmund Daniel., MD  naproxen (NAPROSYN) 500 MG tablet Take 500 mg by mouth 2 (two) times  daily as needed for mild pain. 03/29/22   [provider]     Family History  Problem Relation Age of Onset   Brain cancer Mother    Heart attack Father     Social History   Socioeconomic History   Marital status: Single    Spouse name: Not on file   Number of children: Not on file   Years of education: Not on file   Highest education level: Not on file  Occupational History   Not on file  Tobacco Use   Smoking status: Never   Smokeless  tobacco: Never  Vaping Use   Vaping status: Never Used  Substance and Sexual Activity   Alcohol use: Yes    Comment: very rare   Drug use: No   Sexual activity: Yes  Other Topics Concern   Not on file  Social History Narrative   Not on file   Social Determinants of Health   Financial Resource Strain: Not on file  Food Insecurity: Not on file  Transportation Needs: Not on file  Physical Activity: Not on file  Stress: Not on file  Social Connections: Not on file    Review of Systems: A 12 point ROS discussed and pertinent positives are indicated in the HPI above.  All other systems are negative.  Review of Systems  Constitutional:  Negative for fever.  Respiratory:  Negative for shortness of breath.   Cardiovascular:  Negative for chest pain.  Gastrointestinal:  Negative for abdominal pain.  Psychiatric/Behavioral:  Negative for confusion and decreased concentration.     Vital Signs: BP (!) 100/56   Pulse (!) 58   Temp 98.1 F (36.7 C) (Oral)   Resp 16   Ht 5\' 1"  (1.549 m)   Wt 155 lb (70.3 kg)   SpO2 94%   BMI 29.29 kg/m   Advance Care Plan: The advanced care plan/surrogate decision maker was discussed at the time of visit and documented in the medical record.    Physical Exam HENT:     Head: Normocephalic.     Mouth/Throat:     Mouth: Mucous membranes are moist.     Pharynx: Oropharynx is clear.  Cardiovascular:     Rate and Rhythm: Normal rate and regular rhythm.     Heart sounds: No murmur heard. Pulmonary:     Effort: Pulmonary effort is normal.     Breath sounds: Normal breath sounds.  Abdominal:     General: Abdomen is flat.  Skin:    General: Skin is warm.  Neurological:     Mental Status: She is alert and oriented to person, place, and time.  Psychiatric:        Thought Content: Thought content normal.        Judgment: Judgment normal.     Imaging: MR Lumbar Spine W Wo Contrast  Result Date: 11/08/2023 CLINICAL DATA:  Chronic low back  pain radiating down the right leg, not improved after extension of L4-L5 fusion to S1 2 weeks ago. EXAM: MRI LUMBAR SPINE WITHOUT AND WITH CONTRAST TECHNIQUE: Multiplanar and multiecho pulse sequences of the lumbar spine were obtained without and with intravenous contrast. CONTRAST:  7mL GADAVIST GADOBUTROL 1 MMOL/ML IV SOLN COMPARISON:  MRI lumbar spine dated August 07, 2023. FINDINGS: Segmentation:  Standard. Alignment: Unchanged mild retrolisthesis at L2-L3. Unchanged mild fused anterolisthesis at L4-L5. Improved anterolisthesis at L5-S1 after interval fusion. Vertebrae: Interval L5-S1 PLIF. Prior L4-L5 PLIF. New irregularity and widening of the S1-S2 disc space  with surrounding edema and enhancement. New bilateral sacral ala fractures. No evidence of discitis or suspicious bone lesion. New interspinous edema at L2-L3 and L3-L4. Conus medullaris and cauda equina: Conus extends to the L1 level. Conus appears normal. New mild symmetric enhancement of the bilateral L5 and S1 nerve roots without nerve root thickening. Paraspinal and other soft tissues: Superficial 2.3 x 3.6 x 9.5 cm fluid collection in the midline lower back, consistent with seroma. Disc levels: T12-L1:  No significant disc bulge or herniation.  No stenosis. L1-L2:  No significant disc bulge or herniation.  No stenosis. L2-L3: Unchanged mild disc bulging and bilateral facet arthropathy. Unchanged mild-to-moderate left neuroforaminal stenosis. No spinal canal or right neuroforaminal stenosis. L3-L4: Unchanged mild disc bulging and moderate bilateral facet arthropathy. No stenosis. L4-L5: Prior posterior decompression and PLIF. No residual stenosis. L5-S1: Interval posterior decompression and PLIF. Improved now mild right neuroforaminal stenosis. Resolved left neuroforaminal stenosis. No spinal canal stenosis. IMPRESSION: 1. New bilateral sacral ala and S1-S2 insufficiency fractures. 2. New mild symmetric enhancement of the bilateral L5 and S1 nerve  roots, which could reflect arachnoiditis. 3. Interval L5-S1 PLIF with improved now mild right neuroforaminal stenosis. Resolved left neuroforaminal stenosis. 4. Superficial 2.3 x 3.6 x 9.5 cm fluid collection in the midline lower back, consistent with seroma. Electronically Signed   By: Obie Dredge M.D.   On: 11/08/2023 18:19    Labs:  CBC: Recent Labs    10/21/23 1438 11/27/23 0738  WBC 6.9 7.9  HGB 13.3 12.3  HCT 40.5 36.0  PLT 232 204    COAGS: Recent Labs    11/27/23 0738  INR 1.0    BMP: Recent Labs    10/21/23 1438  NA 140  K 4.1  CL 103  CO2 23  GLUCOSE 122*  BUN 19  CALCIUM 10.0  CREATININE 1.06*  GFRNONAA 56*    LIVER FUNCTION TESTS: No results for input(s): "BILITOT", "AST", "ALT", "ALKPHOS", "PROT", "ALBUMIN" in the last 8760 hours.  TUMOR MARKERS: No results for input(s): "AFPTM", "CEA", "CA199", "CHROMGRNA" in the last 8760 hours.  Assessment and Plan:  Patient is scheduled for a bilateral sacroplasty today.   Risks and benefits of bilateral sacroplasty were discussed with the patient including, but not limited to education regarding the natural healing process of compression fractures without intervention, bleeding, infection, cement migration which may cause spinal cord damage, paralysis, pulmonary embolism or even death.  This interventional procedure involves the use of X-rays and because of the nature of the planned procedure, it is possible that we will have prolonged use of X-ray fluoroscopy.  Potential radiation risks to you include (but are not limited to) the following: - A slightly elevated risk for cancer  several years later in life. This risk is typically less than 0.5% percent. This risk is low in comparison to the normal incidence of human cancer, which is 33% for women and 50% for men according to the [employer]. - Radiation induced injury can include skin redness, resembling a rash, tissue breakdown / ulcers and  hair loss (which can be temporary or permanent).   The likelihood of either of these occurring depends on the difficulty of the procedure and whether you are sensitive to radiation due to previous procedures, disease, or genetic conditions.   IF your procedure requires a prolonged use of radiation, you will be notified and given written instructions for further action.  It is your responsibility to monitor the irradiated area for the 2  weeks following the procedure and to notify your physician if you are concerned that you have suffered a radiation induced injury.    All of the patient's questions were answered, patient is agreeable to proceed.  Consent signed and in chart.   Thank you for this interesting consult.  I greatly enjoyed meeting Mistie Arsenault and look forward to participating in their care.  A copy of this report was sent to the requesting provider on this date.  Electronically Signed: Rosalita Levan, PA 11/27/2023, 8:17 AM   I spent a total of  30 Minutes   in face to face in clinical consultation, greater than 50% of which was counseling/coordinating care for a bilateral sacroplasty.

## 2023-12-04 DIAGNOSIS — Z91018 Allergy to other foods: Secondary | ICD-10-CM | POA: Diagnosis not present

## 2023-12-04 DIAGNOSIS — I700 Atherosclerosis of aorta: Secondary | ICD-10-CM | POA: Diagnosis not present

## 2023-12-04 DIAGNOSIS — Z1331 Encounter for screening for depression: Secondary | ICD-10-CM | POA: Diagnosis not present

## 2023-12-04 DIAGNOSIS — G894 Chronic pain syndrome: Secondary | ICD-10-CM | POA: Diagnosis not present

## 2023-12-04 DIAGNOSIS — R41840 Attention and concentration deficit: Secondary | ICD-10-CM | POA: Diagnosis not present

## 2023-12-04 DIAGNOSIS — Z0000 Encounter for general adult medical examination without abnormal findings: Secondary | ICD-10-CM | POA: Diagnosis not present

## 2023-12-04 DIAGNOSIS — E039 Hypothyroidism, unspecified: Secondary | ICD-10-CM | POA: Diagnosis not present

## 2023-12-04 DIAGNOSIS — Z9889 Other specified postprocedural states: Secondary | ICD-10-CM | POA: Diagnosis not present

## 2023-12-04 DIAGNOSIS — Z79899 Other long term (current) drug therapy: Secondary | ICD-10-CM | POA: Diagnosis not present

## 2023-12-04 DIAGNOSIS — E7800 Pure hypercholesterolemia, unspecified: Secondary | ICD-10-CM | POA: Diagnosis not present

## 2023-12-04 DIAGNOSIS — F319 Bipolar disorder, unspecified: Secondary | ICD-10-CM | POA: Diagnosis not present

## 2023-12-04 DIAGNOSIS — M8448XD Pathological fracture, other site, subsequent encounter for fracture with routine healing: Secondary | ICD-10-CM | POA: Diagnosis not present

## 2023-12-04 DIAGNOSIS — I10 Essential (primary) hypertension: Secondary | ICD-10-CM | POA: Diagnosis not present

## 2023-12-13 ENCOUNTER — Telehealth: Payer: Medicare Other | Admitting: Adult Health

## 2023-12-13 ENCOUNTER — Encounter: Payer: Self-pay | Admitting: Adult Health

## 2023-12-13 DIAGNOSIS — F331 Major depressive disorder, recurrent, moderate: Secondary | ICD-10-CM

## 2023-12-13 DIAGNOSIS — F411 Generalized anxiety disorder: Secondary | ICD-10-CM

## 2023-12-13 MED ORDER — ALPRAZOLAM 1 MG PO TABS: 1.0000 mg | ORAL_TABLET | Freq: Two times a day (BID) | ORAL | 2 refills | Status: DC | PRN

## 2023-12-13 NOTE — Progress Notes (Signed)
Carol Duncan [id number] 07/03/1951 72 y.o.  Virtual Visit via Video Note  I connected with pt @ on 12/13/23 at  2:00 PM EST by a video enabled telemedicine application and verified that I am speaking with the correct person using two identifiers.   I discussed the limitations of evaluation and management by telemedicine and the availability of in person appointments. The patient expressed understanding and agreed to proceed.  I discussed the assessment and treatment plan with the patient. The patient was provided an opportunity to ask questions and all were answered. The patient agreed with the plan and demonstrated an understanding of the instructions.   The patient was advised to call back or seek an in-person evaluation if the symptoms worsen or if the condition fails to improve as anticipated.  I provided 10 minutes of non-face-to-face time during this encounter.  The patient was located at home.  The provider was located at [facility].   Dorothyann Gibbs, NP   Subjective:   Patient ID:  Carol Duncan is a 72 y.o. (DOB 1951-03-05) female.  Chief Complaint: No chief complaint on file.   HPI Shandale Dirk presents for follow-up of MDD and GAD.  Describes mood today as "ok". Pleasant. Denies tearfulness. Mood symptoms - reports some depression and anxiety - reports "dark" thoughts. Denies irritability. Denies panic attacks. Reports some worry - "more situational". Denies rumination and over thinking. Denies obsessive thoughts and acts. Reports situational stressors - medical issues. Mood is lower. Stating "I feel like the medications are helpful". Varying interest and motivation. Taking medications as prescribed.  Energy levels lower. Active, does not have a regular exercise routine with physical limitations. Enjoys some usual interests and activities. Divorced. Lives with a roommate. Spending time with family. Appetite adequate. Reports  weight loss - 15 pounds. Sleeps well most nights. Averages 6 to 7 hours. Reports daytime napping. Focus and concentration "dull"- is not taking the Adderall daily. Completing minimal tasks. Managing aspects of household. Retired. Denies SI or HI.  Denies AH or VH. Denies self harm. Denies substance use.   Therapist - Meredeth Ide  Previous medication trials: Wellbutrin, Lithium,others   Review of Systems:  Review of Systems  Musculoskeletal:  Negative for gait problem.  Neurological:  Negative for tremors.  Psychiatric/Behavioral:         Please refer to HPI    Medications: I have reviewed the patient's current medications.  Current Outpatient Medications  Medication Sig Dispense Refill   ADDERALL 20 MG tablet Take 10 mg by mouth daily.     ALPRAZolam (XANAX) 1 MG tablet Take 1 tablet (1 mg total) by mouth 2 (two) times daily as needed for anxiety. 60 tablet 2   EPINEPHrine 0.3 mg/0.3 mL IJ SOAJ injection Inject 0.3 mg into the muscle as needed for anaphylaxis. 1 each 0   EPINEPHrine 0.3 mg/0.3 mL IJ SOAJ injection Inject 0.3 mg into the muscle as needed for anaphylaxis. Call EMS if you use this. 2 each 1   estradiol (ESTRACE) 1 MG tablet Take 1 mg by mouth daily.     HYDROcodone-acetaminophen (NORCO) 10-325 MG tablet Take 1 tablet by mouth every 4 (four) hours as needed for severe pain (pain score 7-10) (takes one-half tablet). 40 tablet 0   lamoTRIgine (LAMICTAL) 150 MG tablet Take 1 tablet (150 mg total) by mouth 2 (two) times daily. (Patient taking differently: Take 150 mg by mouth daily.) 60 tablet 5   levothyroxine (SYNTHROID) 50 MCG tablet Take  50 mcg by mouth every morning.     naproxen (NAPROSYN) 500 MG tablet Take 500 mg by mouth 2 (two) times daily as needed for mild pain.     pregabalin (LYRICA) 75 MG capsule Take 75 mg by mouth 2 (two) times daily.     QUEtiapine (SEROQUEL) 100 MG tablet Take one tablet at bedtime. 30 tablet 5   tiZANidine (ZANAFLEX) 4 MG  tablet Take 1 tablet (4 mg total) by mouth every 6 (six) hours as needed for muscle spasms. 30 tablet 0   valACYclovir (VALTREX) 500 MG tablet Take 500 mg by mouth daily. As needed for outbreaks     valsartan-hydrochlorothiazide (DIOVAN-HCT) 80-12.5 MG tablet Take 1 tablet by mouth daily.     No current facility-administered medications for this visit.    Medication Side Effects: None  Allergies:  Allergies  Allergen Reactions   Shellfish Allergy     Shrimp ok,  Scallops, clams, oysters give angioedema  Other Reaction(s): Not available   Beef-Derived Drug Products    Lambs Quarters    Pork-Derived Products    Wheat Other (See Comments) and Nausea And Vomiting    Upset stomach    Past Medical History:  Diagnosis Date   ADHD (attention deficit hyperactivity disorder)    Anemia    Anxiety    Arthritis    Bile duct abnormality    Complication of anesthesia    Takes alot to keep patient under anesthesia   Depression    Diverticulitis    Fibromyalgia    Hypertension    Hypothyroidism     Family History  Problem Relation Age of Onset   Brain cancer Mother    Heart attack Father     Social History   Socioeconomic History   Marital status: Single    Spouse name: Not on file   Number of children: Not on file   Years of education: Not on file   Highest education level: Not on file  Occupational History   Not on file  Tobacco Use   Smoking status: Never   Smokeless tobacco: Never  Vaping Use   Vaping status: Never Used  Substance and Sexual Activity   Alcohol use: Yes    Comment: very rare   Drug use: No   Sexual activity: Yes  Other Topics Concern   Not on file  Social History Narrative   Not on file   Social Drivers of Health   Financial Resource Strain: Not on file  Food Insecurity: Not on file  Transportation Needs: Not on file  Physical Activity: Not on file  Stress: Not on file  Social Connections: Not on file  Intimate Partner Violence: Not  on file    Past Medical History, Surgical history, Social history, and Family history were reviewed and updated as appropriate.   Please see review of systems for further details on the patient's review from today.   Objective:   Physical Exam:  There were no vitals taken for this visit.  Physical Exam Constitutional:      General: She is not in acute distress. Musculoskeletal:        General: No deformity.  Neurological:     Mental Status: She is alert and oriented to person, place, and time.     Coordination: Coordination normal.  Psychiatric:        Attention and Perception: Attention and perception normal. She does not perceive auditory or visual hallucinations.        Mood  and Affect: Affect is not labile, blunt, angry or inappropriate.        Speech: Speech normal.        Behavior: Behavior normal.        Thought Content: Thought content normal. Thought content is not paranoid or delusional. Thought content does not include homicidal or suicidal ideation. Thought content does not include homicidal or suicidal plan.        Cognition and Memory: Cognition and memory normal.        Judgment: Judgment normal.     Comments: Insight intact     Duncan Review:     Component Value Date/Time   NA 137 11/27/2023 0738   K 3.4 (L) 11/27/2023 0738   CL 102 11/27/2023 0738   CO2 22 11/27/2023 0738   GLUCOSE 107 (H) 11/27/2023 0738   BUN 48 (H) 11/27/2023 0738   CREATININE 1.66 (H) 11/27/2023 0738   CALCIUM 9.6 11/27/2023 0738   PROT 5.2 (L) 05/22/2022 0120   ALBUMIN 2.8 (L) 05/22/2022 0120   AST 16 05/22/2022 0120   ALT 13 05/22/2022 0120   ALKPHOS 27 (L) 05/22/2022 0120   BILITOT 0.6 05/22/2022 0120   GFRNONAA 33 (L) 11/27/2023 0738   GFRAA >60 12/21/2017 0923       Component Value Date/Time   WBC 7.9 11/27/2023 0738   RBC 3.86 (L) 11/27/2023 0738   HGB 12.3 11/27/2023 0738   HCT 36.0 11/27/2023 0738   PLT 204 11/27/2023 0738   MCV 93.3 11/27/2023 0738   MCH 31.9  11/27/2023 0738   MCHC 34.2 11/27/2023 0738   RDW 13.8 11/27/2023 0738   LYMPHSABS 1.1 09/24/2015 0858   MONOABS 0.4 09/24/2015 0858   EOSABS 0.0 09/24/2015 0858   BASOSABS 0.0 09/24/2015 0858    Lithium Lvl  Date Value Ref Range Status  02/26/2016 <0.06 (L) 0.60 - 1.20 mmol/L Final     No results found for: "PHENYTOIN", "PHENOBARB", "VALPROATE", "CBMZ"   .res Assessment: Plan:    Plan:  PDMP reviewed  Seroquel 100mg  at bedtime for sleep - has 3 months of refills left. Lamictal 150mg  BID - has 3 months of refills left. Xanax 1mg  twice daily.  RTC 2 months  Patient advised to contact office with any questions, adverse effects, or acute worsening in signs and symptoms.   Discussed potential benefits, risk, and side effects of benzodiazepines to include potential risk of tolerance and dependence, as well as possible drowsiness. Advised patient not to drive if experiencing drowsiness and to take lowest possible effective dose to minimize risk of dependence and tolerance.   Counseled patient regarding potential benefits, risks, and side effects of Lamictal to include potential risk of Stevens-Johnson syndrome. Advised patient to stop taking Lamictal and contact office immediately if rash develops and to seek urgent medical attention if rash is severe and/or spreading quickly. There are no diagnoses linked to this encounter.   Please see After Visit Summary for patient specific instructions.  Future Appointments  Date Time Provider Department Center  12/13/2023  2:00 PM Saidee Geremia, Thereasa Solo, NP [facility] None    No orders of the defined types were placed in this encounter.     -------------------------------

## 2024-01-01 ENCOUNTER — Other Ambulatory Visit (HOSPITAL_COMMUNITY): Payer: Self-pay | Admitting: Neurosurgery

## 2024-01-01 DIAGNOSIS — M4316 Spondylolisthesis, lumbar region: Secondary | ICD-10-CM

## 2024-01-01 DIAGNOSIS — M8448XA Pathological fracture, other site, initial encounter for fracture: Secondary | ICD-10-CM

## 2024-01-01 DIAGNOSIS — R102 Pelvic and perineal pain: Secondary | ICD-10-CM

## 2024-01-13 ENCOUNTER — Ambulatory Visit (HOSPITAL_COMMUNITY)
Admission: RE | Admit: 2024-01-13 | Discharge: 2024-01-13 | Disposition: A | Discharge status: Home / Self Care | Payer: Medicare Other | Source: Ambulatory Visit | Attending: Neurosurgery | Admitting: Neurosurgery

## 2024-01-13 DIAGNOSIS — R102 Pelvic and perineal pain unspecified side: Secondary | ICD-10-CM

## 2024-01-13 DIAGNOSIS — Z981 Arthrodesis status: Secondary | ICD-10-CM | POA: Diagnosis not present

## 2024-01-13 DIAGNOSIS — M8448XA Pathological fracture, other site, initial encounter for fracture: Secondary | ICD-10-CM

## 2024-01-13 DIAGNOSIS — M4316 Spondylolisthesis, lumbar region: Secondary | ICD-10-CM

## 2024-01-13 DIAGNOSIS — S3210XA Unspecified fracture of sacrum, initial encounter for closed fracture: Secondary | ICD-10-CM | POA: Diagnosis not present

## 2024-01-13 DIAGNOSIS — I700 Atherosclerosis of aorta: Secondary | ICD-10-CM | POA: Diagnosis not present

## 2024-01-13 DIAGNOSIS — Z90710 Acquired absence of both cervix and uterus: Secondary | ICD-10-CM | POA: Diagnosis not present

## 2024-01-13 DIAGNOSIS — M48061 Spinal stenosis, lumbar region without neurogenic claudication: Secondary | ICD-10-CM | POA: Diagnosis not present

## 2024-01-13 DIAGNOSIS — M4319 Spondylolisthesis, multiple sites in spine: Secondary | ICD-10-CM | POA: Diagnosis not present

## 2024-01-29 DIAGNOSIS — M8448XA Pathological fracture, other site, initial encounter for fracture: Secondary | ICD-10-CM | POA: Diagnosis not present

## 2024-01-29 DIAGNOSIS — Z6832 Body mass index (BMI) 32.0-32.9, adult: Secondary | ICD-10-CM | POA: Diagnosis not present

## 2024-01-31 ENCOUNTER — Other Ambulatory Visit: Payer: Self-pay | Admitting: Neurosurgery

## 2024-02-04 ENCOUNTER — Ambulatory Visit: Payer: Medicare Other | Admitting: Family Medicine

## 2024-02-11 ENCOUNTER — Ambulatory Visit (INDEPENDENT_AMBULATORY_CARE_PROVIDER_SITE_OTHER): Payer: Medicare Other | Admitting: Family Medicine

## 2024-02-11 ENCOUNTER — Encounter: Payer: Self-pay | Admitting: Family Medicine

## 2024-02-11 VITALS — BP 130/82 | Ht 61.0 in | Wt 170.0 lb

## 2024-02-11 DIAGNOSIS — M8448XA Pathological fracture, other site, initial encounter for fracture: Secondary | ICD-10-CM | POA: Insufficient documentation

## 2024-02-11 DIAGNOSIS — M8448XS Pathological fracture, other site, sequela: Secondary | ICD-10-CM

## 2024-02-11 DIAGNOSIS — M810 Age-related osteoporosis without current pathological fracture: Secondary | ICD-10-CM | POA: Diagnosis not present

## 2024-02-11 DIAGNOSIS — M8000XA Age-related osteoporosis with current pathological fracture, unspecified site, initial encounter for fracture: Secondary | ICD-10-CM

## 2024-02-11 DIAGNOSIS — E039 Hypothyroidism, unspecified: Secondary | ICD-10-CM

## 2024-02-11 NOTE — Assessment & Plan Note (Signed)
 Bilateral sacral ala fracture status post lumbar fusion consistent with pathologic osteoporotic insufficiency fracture.  Occurred while patient was just sitting down in a chair, no specific injury or trauma  PLAN: -Candidate for osteoporosis treatment as noted above -Annual follow-up with Dr. Dutch Quint for scheduled surgery 03/02/2024 for S1-S2 fusion

## 2024-02-11 NOTE — Progress Notes (Signed)
 DATE OF VISIT: 02/11/2024        Carol Duncan DOB: 1951/01/09 MRN: [id number]  CC:  Osteoporosis eval  History- Carol Duncan is a 73 y.o. female for evaluation and treatment of osteoporosis - referred by Neurosurgery - Dr Lelon Perla for consideration of anabolic therapy (Evenity, Tymlos, [facility]) - hx bilateral sacral insufficiency fx 10/2023 - hx L5-S1 decompression & fusion complicated by post-op sacral ala fractures secondary to osteoporotic insufficiency; now s/p sacroplasty - notes from 01/01/24, 11/12/23, 11/05/23, 10/15/23, 09/19/23 reviewed in 'Media' section of chart today -- scheduled for surgery 03/02/24 for S1-S2 fusion  Prior treatment: none History of Hip, Spine, or Wrist Fracture: YES - bilateral sacral insufficiency fractures 10/2023 s/p lumbar fusion surgery Heart disease or stroke: no Cancer: no Kidney Disease: no Gastric/Peptic Ulcer: no Gastric bypass surgery: no Severe GERD: no History of seizures: no Age at Menopause: 73yo - hysterectomy Hysterectomy: age 30yo for uterine fibroids Calcium intake: MVI daily Vitamin D intake: MVI + 1000 international units daily Hormone replacement therapy: Estradiol daily since hysterectomy Smoking history: never Alcohol: non Exercise: limited over the last several months since lumbar fusion and sacral fractures - previously walking daily up to 2-3 miles - was a tai chi instructor up until 2020, stopped due to COVID pandemic Major dental work in past year: no Parents with hip/spine fracture: no  No prior DEXA scan  Labs 11/27/23: - Ca= 9.6  - normal CBC  Labs 05/21/22:  - ionized Ca= 5.4   Past Medical History Past Medical History:  Diagnosis Date   ADHD (attention deficit hyperactivity disorder)    Anemia    Anxiety    Arthritis    Bile duct abnormality    Complication of anesthesia    Takes alot to keep patient under anesthesia   Depression    Diverticulitis    Fibromyalgia     Hypertension    Hypothyroidism     Past Surgical History Past Surgical History:  Procedure Laterality Date   ABDOMINAL HYSTERECTOMY     APPENDECTOMY     during hysterectomy   CHOLECYSTECTOMY N/A 02/27/2016   Procedure: LAPAROSCOPIC CHOLECYSTECTOMY WITH INTRAOPERATIVE CHOLANGIOGRAM;  Surgeon: Romie Levee, MD;  Location: [facility];  Service: General;  Laterality: N/A;   IR SACROPLASTY BILATERAL  11/27/2023   myomectomy      Medications Current Outpatient Medications  Medication Sig Dispense Refill   ADDERALL 20 MG tablet Take 10 mg by mouth daily.     ALPRAZolam (XANAX) 1 MG tablet Take 1 tablet (1 mg total) by mouth 2 (two) times daily as needed for anxiety. 60 tablet 2   EPINEPHrine 0.3 mg/0.3 mL IJ SOAJ injection Inject 0.3 mg into the muscle as needed for anaphylaxis. 1 each 0   EPINEPHrine 0.3 mg/0.3 mL IJ SOAJ injection Inject 0.3 mg into the muscle as needed for anaphylaxis. Call EMS if you use this. 2 each 1   estradiol (ESTRACE) 1 MG tablet Take 1 mg by mouth daily.     HYDROcodone-acetaminophen (NORCO) 10-325 MG tablet Take 1 tablet by mouth every 4 (four) hours as needed for severe pain (pain score 7-10) (takes one-half tablet). 40 tablet 0   lamoTRIgine (LAMICTAL) 150 MG tablet Take 1 tablet (150 mg total) by mouth 2 (two) times daily. (Patient taking differently: Take 150 mg by mouth daily.) 60 tablet 5   levothyroxine (SYNTHROID) 50 MCG tablet Take 50 mcg by mouth every morning.     naproxen (NAPROSYN) 500 MG tablet Take  500 mg by mouth 2 (two) times daily as needed for mild pain.     pregabalin (LYRICA) 75 MG capsule Take 75 mg by mouth 2 (two) times daily.     QUEtiapine (SEROQUEL) 100 MG tablet Take one tablet at bedtime. 30 tablet 5   tiZANidine (ZANAFLEX) 4 MG tablet Take 1 tablet (4 mg total) by mouth every 6 (six) hours as needed for muscle spasms. 30 tablet 0   valACYclovir (VALTREX) 500 MG tablet Take 500 mg by mouth daily. As needed for outbreaks      valsartan-hydrochlorothiazide (DIOVAN-HCT) 80-12.5 MG tablet Take 1 tablet by mouth daily.     No current facility-administered medications for this visit.    Allergies is allergic to shellfish allergy, beef-derived drug products, lambs quarters, pork-derived products, and wheat.  Family History - reviewed per EMR and intake form  Social History   reports current alcohol use.  reports that she has never smoked. She has never used smokeless tobacco.  reports no history of drug use.   EXAM: Vitals: BP 130/82   Ht 5\' 1"  (1.549 m)   Wt 170 lb (77.1 kg)   BMI 32.12 kg/m  General: AOx3, NAD, pleasant MSK: Sitting comfortably in chair today Normal gait PSYCH: appropriate mood, answering questions appropriately, good insight Assessment & Plan Age-related osteoporosis with current pathological fracture, initial encounter Bilateral sacral ala fracture status post lumbar fusion consistent with pathologic osteoporotic insufficiency fracture.  Occurred while patient was just sitting down in a chair, no specific injury or trauma -No prior treatment or evaluation for osteoporosis  Plan: -Notes from visit with neurosurgeon Dr. Dutch Quint reviewed and summarized as noted above in the HPI -Labs: Will obtain updated labs including CBC with differential, CMP, PTH, TSH, SPEP, vitamin D -Imaging: Will obtain DEXA scan to assess overall bone density, although with insufficiency fracture she already meets criteria for osteoporosis -Given her recent insufficiency fractures she is a candidate for anabolic therapy.  We will submit for authorization of Evenity which should take monthly x 12 months, then followed by Prolia every 6 months.  Explained medications, risks and benefits.  Patient would be interested in proceeding with these -Recommend that she start taking a calcium supplement.  Goal is a minimum of 1200 mg a day. -Should continue to take regular vitamin D, with goal at least 800 international units  daily -She is currently limited in exercise due to recent lumbar fusion, as well as complication of sacral insufficiency fracture.  She can continue to walk as tolerated.  Once cleared by Dr. Dutch Quint, advised to try to advance to some additional weightbearing exercise such as tai chi, Pilates, light weight lifting -Follow-up pending authorization of medication therapy and above testing Bilateral sacral insufficiency fracture, sequela Bilateral sacral ala fracture status post lumbar fusion consistent with pathologic osteoporotic insufficiency fracture.  Occurred while patient was just sitting down in a chair, no specific injury or trauma  PLAN: -Candidate for osteoporosis treatment as noted above -Annual follow-up with Dr. Dutch Quint for scheduled surgery 03/02/2024 for S1-S2 fusion Hypothyroidism, unspecified type Reports TSH and dose of thyroxine have been stable for quite some time.  Possible that hypothyroidism is contributing to her osteoporosis  Plan: -Will check updated TSH -Continue levothyroxine as prescribed  Patient expressed understanding & agreement with above.  Encounter Diagnoses  Name Primary?   Age-related osteoporosis with current pathological fracture, initial encounter Yes   Bilateral sacral insufficiency fracture, sequela    Hypothyroidism, unspecified type     Orders  Placed This Encounter  Procedures   DG BONE DENSITY (DXA)   CBC w/Diff   Comp Met (CMET)   VITAMIN D 25 Hydroxy (Vit-D Deficiency, Fractures)   TSH   Protein electrophoresis, serum   PTH, intact (no Ca)    Orders Placed This Encounter  Procedures   DG BONE DENSITY (DXA)   CBC w/Diff   Comp Met (CMET)   VITAMIN D 25 Hydroxy (Vit-D Deficiency, Fractures)   TSH   Protein electrophoresis, serum   PTH, intact (no Ca)

## 2024-02-11 NOTE — Assessment & Plan Note (Signed)
 Bilateral sacral ala fracture status post lumbar fusion consistent with pathologic osteoporotic insufficiency fracture.  Occurred while patient was just sitting down in a chair, no specific injury or trauma -No prior treatment or evaluation for osteoporosis  Plan: -Notes from visit with neurosurgeon Dr. Dutch Quint reviewed and summarized as noted above in the HPI -Labs: Will obtain updated labs including CBC with differential, CMP, PTH, TSH, SPEP, vitamin D -Imaging: Will obtain DEXA scan to assess overall bone density, although with insufficiency fracture she already meets criteria for osteoporosis -Given her recent insufficiency fractures she is a candidate for anabolic therapy.  We will submit for authorization of Evenity which should take monthly x 12 months, then followed by Prolia every 6 months.  Explained medications, risks and benefits.  Patient would be interested in proceeding with these -Recommend that she start taking a calcium supplement.  Goal is a minimum of 1200 mg a day. -Should continue to take regular vitamin D, with goal at least 800 international units daily -She is currently limited in exercise due to recent lumbar fusion, as well as complication of sacral insufficiency fracture.  She can continue to walk as tolerated.  Once cleared by Dr. Dutch Quint, advised to try to advance to some additional weightbearing exercise such as tai chi, Pilates, light weight lifting -Follow-up pending authorization of medication therapy and above testing

## 2024-02-11 NOTE — Assessment & Plan Note (Signed)
 Reports TSH and dose of thyroxine have been stable for quite some time.  Possible that hypothyroidism is contributing to her osteoporosis  Plan: -Will check updated TSH -Continue levothyroxine as prescribed

## 2024-02-11 NOTE — Patient Instructions (Addendum)
 Osteoporosis Work-up and Treatment  Labwork to obtain: CMP, CBC with diff, 25-OH Vit D, TSH, PTH, SPEP   Supplementation: Make sure you're getting 1200mg  calcium and 800 IU of vitamin D daily  Exercise: Weight bearing exercise with walking until cleared by Dr Dutch Quint to advance your activity  Medication Options: Evenity - this builds bone, is an injection once a month for 12 months - after you finish this you would go on a medicine that keeps it built up (Prolia or fosamax) Prolia - injection that just maintains bone - given every 6 months here in the office  Bone Density: [facility] Address: [address], [place], [place] [address] Phone: [phone]

## 2024-02-12 LAB — CBC WITH DIFFERENTIAL/PLATELET
Basophils Absolute: 0.1 10*3/uL (ref 0.0–0.2)
Basos: 1 %
EOS (ABSOLUTE): 0.1 10*3/uL (ref 0.0–0.4)
Eos: 1 %
Hematocrit: 43.9 % (ref 34.0–46.6)
Hemoglobin: 14.7 g/dL (ref 11.1–15.9)
Immature Grans (Abs): 0 10*3/uL (ref 0.0–0.1)
Immature Granulocytes: 0 %
Lymphocytes Absolute: 2.2 10*3/uL (ref 0.7–3.1)
Lymphs: 25 %
MCH: 32.6 pg (ref 26.6–33.0)
MCHC: 33.5 g/dL (ref 31.5–35.7)
MCV: 97 fL (ref 79–97)
Monocytes Absolute: 0.9 10*3/uL (ref 0.1–0.9)
Monocytes: 10 %
Neutrophils Absolute: 5.7 10*3/uL (ref 1.4–7.0)
Neutrophils: 63 %
Platelets: 285 10*3/uL (ref 150–450)
RBC: 4.51 x10E6/uL (ref 3.77–5.28)
RDW: 12.5 % (ref 11.7–15.4)
WBC: 9.1 10*3/uL (ref 3.4–10.8)

## 2024-02-12 LAB — PROTEIN ELECTROPHORESIS, SERUM
A/G Ratio: 1 (ref 0.7–1.7)
Albumin ELP: 3.8 g/dL (ref 2.9–4.4)
Alpha 1: 0.3 g/dL (ref 0.0–0.4)
Alpha 2: 1.2 g/dL — ABNORMAL HIGH (ref 0.4–1.0)
Beta: 1.4 g/dL — ABNORMAL HIGH (ref 0.7–1.3)
Gamma Globulin: 1.1 g/dL (ref 0.4–1.8)
Globulin, Total: 3.9 g/dL (ref 2.2–3.9)

## 2024-02-12 LAB — COMPREHENSIVE METABOLIC PANEL
ALT: 11 IU/L (ref 0–32)
AST: 19 IU/L (ref 0–40)
Albumin: 4.7 g/dL (ref 3.8–4.8)
Alkaline Phosphatase: 94 IU/L (ref 44–121)
BUN/Creatinine Ratio: 20 (ref 12–28)
BUN: 19 mg/dL (ref 8–27)
Bilirubin Total: <0.2 mg/dL (ref 0.0–1.2)
CO2: 27 mmol/L (ref 20–29)
Calcium: 11 mg/dL — ABNORMAL HIGH (ref 8.7–10.3)
Chloride: 99 mmol/L (ref 96–106)
Creatinine, Ser: 0.97 mg/dL (ref 0.57–1.00)
Globulin, Total: 3 g/dL (ref 1.5–4.5)
Glucose: 81 mg/dL (ref 70–99)
Potassium: 5.2 mmol/L (ref 3.5–5.2)
Sodium: 141 mmol/L (ref 134–144)
Total Protein: 7.7 g/dL (ref 6.0–8.5)
eGFR: 62 mL/min/{1.73_m2} (ref >59)

## 2024-02-12 LAB — VITAMIN D 25 HYDROXY (VIT D DEFICIENCY, FRACTURES): Vit D, 25-Hydroxy: 62.2 ng/mL (ref 30.0–100.0)

## 2024-02-12 LAB — TSH: TSH: 1.77 u[IU]/mL (ref 0.450–4.500)

## 2024-02-12 LAB — PARATHYROID HORMONE, INTACT (NO CA): PTH: 18 pg/mL (ref 15–65)

## 2024-02-13 ENCOUNTER — Encounter: Payer: Self-pay | Admitting: Family Medicine

## 2024-02-13 ENCOUNTER — Encounter (HOSPITAL_COMMUNITY): Payer: Self-pay | Admitting: Neurosurgery

## 2024-02-13 DIAGNOSIS — M8448XA Pathological fracture, other site, initial encounter for fracture: Secondary | ICD-10-CM

## 2024-02-13 NOTE — Progress Notes (Signed)
 Labs reviewed.  Elevated Ca, but normal Vit D, normal PTH.  SPEP with elevated Alpha2 & Beta c/w acute phase reactants.  Remainder of labs normal.  Abnormalities likely related to body's response to sacral fracture and healing process.  Ok to continue with osteoporosis treatment pending insurance authorization.

## 2024-02-14 ENCOUNTER — Encounter (HOSPITAL_COMMUNITY): Payer: Self-pay | Admitting: Neurosurgery

## 2024-02-14 ENCOUNTER — Other Ambulatory Visit (HOSPITAL_COMMUNITY): Payer: Self-pay | Admitting: Neurosurgery

## 2024-02-14 DIAGNOSIS — Z01818 Encounter for other preprocedural examination: Secondary | ICD-10-CM

## 2024-02-14 DIAGNOSIS — S32009K Unspecified fracture of unspecified lumbar vertebra, subsequent encounter for fracture with nonunion: Secondary | ICD-10-CM

## 2024-02-17 ENCOUNTER — Telehealth: Payer: Self-pay | Admitting: *Deleted

## 2024-02-17 NOTE — Telephone Encounter (Signed)
 Called pt to inform her of her evenity therapy benefits.  Pt would pay $0 for her injections in 2025.  Pt is sched to have surgery with Dr Dutch Quint in 2 weeks.  She will contact me about starting evenity once Dr Dutch Quint gives her a recommended date to begin.

## 2024-02-17 NOTE — Telephone Encounter (Signed)
 Patient is ready for scheduling on or after: 02/17/24 BUY AND BILL  Out-of-pocket cost due at time of visit: $0  Primary: [place] [employer]  Evenity co-insurance: 20% (approximately 482.41) Admin fee co-insurance: 20% (approximately $25)  Deductible: n/a  Prior Auth: not req'd  Secondary: [employer] med supp this is a Medicare Supplement Plan F and it covers the Medicare Part B deductible, co-insurance and 100% of the excess charges.    ** This summary of benefits is an estimation of the patient's out-of-pocket cost. Exact cost may vary based on individual plan coverage.

## 2024-02-18 ENCOUNTER — Other Ambulatory Visit (HOSPITAL_COMMUNITY)

## 2024-02-18 ENCOUNTER — Telehealth: Payer: Medicare Other | Admitting: Adult Health

## 2024-02-18 ENCOUNTER — Encounter: Payer: Self-pay | Admitting: Adult Health

## 2024-02-18 DIAGNOSIS — F331 Major depressive disorder, recurrent, moderate: Secondary | ICD-10-CM | POA: Diagnosis not present

## 2024-02-18 DIAGNOSIS — F411 Generalized anxiety disorder: Secondary | ICD-10-CM | POA: Diagnosis not present

## 2024-02-18 MED ORDER — ALPRAZOLAM 0.5 MG PO TABS: ORAL_TABLET | ORAL | 2 refills | Status: DC

## 2024-02-18 MED ORDER — QUETIAPINE FUMARATE 150 MG PO TABS: ORAL_TABLET | ORAL | 5 refills | Status: DC

## 2024-02-18 MED ORDER — LAMOTRIGINE 150 MG PO TABS: 150.0000 mg | ORAL_TABLET | Freq: Two times a day (BID) | ORAL | 5 refills | Status: DC

## 2024-02-18 NOTE — Progress Notes (Signed)
 Carol Duncan [id number] October 18, 1951 73 y.o.  Virtual Visit via Video Note  I connected with pt @ on 02/18/24 at  2:00 PM EST by a video enabled telemedicine application and verified that I am speaking with the correct person using two identifiers.   I discussed the limitations of evaluation and management by telemedicine and the availability of in person appointments. The patient expressed understanding and agreed to proceed.  I discussed the assessment and treatment plan with the patient. The patient was provided an opportunity to ask questions and all were answered. The patient agreed with the plan and demonstrated an understanding of the instructions.   The patient was advised to call back or seek an in-person evaluation if the symptoms worsen or if the condition fails to improve as anticipated.  I provided 25 minutes of non-face-to-face time during this encounter.  The patient was located at home.  The provider was located at [facility].   Dorothyann Gibbs, NP   Subjective:   Patient ID:  Carol Duncan is a 73 y.o. (DOB [birth year]) female.  Chief Complaint: No chief complaint on file.   HPI Carol Duncan presents for follow-up of MDD and GAD.  Describes mood today as "ok". Pleasant. Denies tearfulness. Mood symptoms - reports depression and anxiety. Reports varying interest and motivation. Denies irritability. Reports panic attacks - "once a week". Reports some worry, rumination and over thinking. Denies obsessive thoughts and acts. Reports ongoing situational stressors - medical issues - losing roommate - financial. Mood is lower. Stating "I feel like the medications help me". Taking medications as prescribed.  Energy levels lower. Active, does not have a regular exercise routine with physical limitations. Does not enjoy usual interests and activities. Divorced. Lives with a roommate. Spending time with family. Appetite adequate. Weight  stable - 170 pounds.  Sleeping better some nights than others. Averages 5 hours. Reports 1 hour of daytime napping. Focus and concentration "not good"- taking Adderall daily. Completing minimal tasks. Managing aspects of household. Retired. Denies SI or HI.  Denies AH or VH. Denies self harm. Denies substance use.   Therapist - Meredeth Ide  Previous medication trials: Wellbutrin, Lithium,others   Review of Systems:  Review of Systems  Musculoskeletal:  Negative for gait problem.  Neurological:  Negative for tremors.  Psychiatric/Behavioral:         Please refer to HPI    Medications: I have reviewed the patient's current medications.  Current Outpatient Medications  Medication Sig Dispense Refill   ADDERALL 20 MG tablet Take 10 mg by mouth daily.     ALPRAZolam (XANAX) 0.5 MG tablet Take one tablet four times daily. 120 tablet 2   EPINEPHrine 0.3 mg/0.3 mL IJ SOAJ injection Inject 0.3 mg into the muscle as needed for anaphylaxis. 1 each 0   EPINEPHrine 0.3 mg/0.3 mL IJ SOAJ injection Inject 0.3 mg into the muscle as needed for anaphylaxis. Call EMS if you use this. 2 each 1   estradiol (ESTRACE) 1 MG tablet Take 1 mg by mouth daily.     HYDROcodone-acetaminophen (NORCO) 10-325 MG tablet Take 1 tablet by mouth every 4 (four) hours as needed for severe pain (pain score 7-10) (takes one-half tablet). 40 tablet 0   lamoTRIgine (LAMICTAL) 150 MG tablet Take 1 tablet (150 mg total) by mouth 2 (two) times daily. 60 tablet 5   levothyroxine (SYNTHROID) 50 MCG tablet Take 50 mcg by mouth every morning.     naproxen (NAPROSYN) 500 MG tablet  Take 500 mg by mouth 2 (two) times daily as needed for mild pain.     pregabalin (LYRICA) 75 MG capsule Take 75 mg by mouth 2 (two) times daily.     QUEtiapine 150 MG TABS Take one tablet at bedtime. 30 tablet 5   tiZANidine (ZANAFLEX) 4 MG tablet Take 1 tablet (4 mg total) by mouth every 6 (six) hours as needed for muscle spasms. 30 tablet 0    valACYclovir (VALTREX) 500 MG tablet Take 500 mg by mouth daily. As needed for outbreaks     valsartan-hydrochlorothiazide (DIOVAN-HCT) 80-12.5 MG tablet Take 1 tablet by mouth daily.     No current facility-administered medications for this visit.    Medication Side Effects: None  Allergies:  Allergies  Allergen Reactions   Shellfish Allergy     Shrimp ok,  Scallops, clams, oysters give angioedema  Other Reaction(s): Not available   Beef-Derived Drug Products    Lambs Quarters    Pork-Derived Products    Wheat Other (See Comments) and Nausea And Vomiting    Upset stomach    Past Medical History:  Diagnosis Date   ADHD (attention deficit hyperactivity disorder)    Anemia    Anxiety    Arthritis    Bile duct abnormality    Complication of anesthesia    Takes alot to keep patient under anesthesia   Depression    Diverticulitis    Fibromyalgia    Hypertension    Hypothyroidism     Family History  Problem Relation Age of Onset   Brain cancer Mother    Heart attack Father     Social History   Socioeconomic History   Marital status: Single    Spouse name: Not on file   Number of children: Not on file   Years of education: Not on file   Highest education level: Not on file  Occupational History   Not on file  Tobacco Use   Smoking status: Never   Smokeless tobacco: Never  Vaping Use   Vaping status: Never Used  Substance and Sexual Activity   Alcohol use: Yes    Comment: very rare   Drug use: No   Sexual activity: Yes  Other Topics Concern   Not on file  Social History Narrative   Not on file   Social Drivers of Health   Financial Resource Strain: Not on file  Food Insecurity: Not on file  Transportation Needs: Not on file  Physical Activity: Not on file  Stress: Not on file  Social Connections: Not on file  Intimate Partner Violence: Not on file    Past Medical History, Surgical history, Social history, and Family history were reviewed and  updated as appropriate.   Please see review of systems for further details on the patient's review from today.   Objective:   Physical Exam:  There were no vitals taken for this visit.  Physical Exam Constitutional:      General: She is not in acute distress. Musculoskeletal:        General: No deformity.  Neurological:     Mental Status: She is alert and oriented to person, place, and time.     Coordination: Coordination normal.  Psychiatric:        Attention and Perception: Attention and perception normal. She does not perceive auditory or visual hallucinations.        Mood and Affect: Affect is not labile, blunt, angry or inappropriate.  Speech: Speech normal.        Behavior: Behavior normal.        Thought Content: Thought content normal. Thought content is not paranoid or delusional. Thought content does not include homicidal or suicidal ideation. Thought content does not include homicidal or suicidal plan.        Cognition and Memory: Cognition and memory normal.        Judgment: Judgment normal.     Comments: Insight intact     Lab Review:     Component Value Date/Time   NA 141 02/11/2024 1449   K 5.2 02/11/2024 1449   CL 99 02/11/2024 1449   CO2 27 02/11/2024 1449   GLUCOSE 81 02/11/2024 1449   GLUCOSE 107 (H) 11/27/2023 0738   BUN 19 02/11/2024 1449   CREATININE 0.97 02/11/2024 1449   CALCIUM 11.0 (H) 02/11/2024 1449   PROT 7.7 02/11/2024 1449   ALBUMIN 4.7 02/11/2024 1449   AST 19 02/11/2024 1449   ALT 11 02/11/2024 1449   ALKPHOS 94 02/11/2024 1449   BILITOT <0.2 02/11/2024 1449   GFRNONAA 33 (L) 11/27/2023 0738   GFRAA >60 12/21/2017 0923       Component Value Date/Time   WBC 9.1 02/11/2024 1449   WBC 7.9 11/27/2023 0738   RBC 4.51 02/11/2024 1449   RBC 3.86 (L) 11/27/2023 0738   HGB 14.7 02/11/2024 1449   HCT 43.9 02/11/2024 1449   PLT 285 02/11/2024 1449   MCV 97 02/11/2024 1449   MCH 32.6 02/11/2024 1449   MCH 31.9 11/27/2023 0738    MCHC 33.5 02/11/2024 1449   MCHC 34.2 11/27/2023 0738   RDW 12.5 02/11/2024 1449   LYMPHSABS 2.2 02/11/2024 1449   MONOABS 0.4 09/24/2015 0858   EOSABS 0.1 02/11/2024 1449   BASOSABS 0.1 02/11/2024 1449    Lithium Lvl  Date Value Ref Range Status  02/26/2016 <0.06 (L) 0.60 - 1.20 mmol/L Final     No results found for: "PHENYTOIN", "PHENOBARB", "VALPROATE", "CBMZ"   .res Assessment: Plan:    Plan:  PDMP reviewed  Seroquel 100mg  at bedtime for sleep  Lamictal 150mg  BID  Xanax 1mg  twice daily.  RTC 3 months  25 minutes spent dedicated to the care of this patient on the date of this encounter to include pre-visit review of records, ordering of medication, post visit documentation, and face-to-face time with the patient discussing depression and anxiety. Discussed continuing current medication regimen.  Patient advised to contact office with any questions, adverse effects, or acute worsening in signs and symptoms.   Discussed potential benefits, risk, and side effects of benzodiazepines to include potential risk of tolerance and dependence, as well as possible drowsiness. Advised patient not to drive if experiencing drowsiness and to take lowest possible effective dose to minimize risk of dependence and tolerance.   Counseled patient regarding potential benefits, risks, and side effects of Lamictal to include potential risk of Stevens-Johnson syndrome. Advised patient to stop taking Lamictal and contact office immediately if rash develops and to seek urgent medical attention if rash is severe and/or spreading quickly.   Diagnoses and all orders for this visit:  Major depressive disorder, recurrent episode, moderate (HCC) -     lamoTRIgine (LAMICTAL) 150 MG tablet; Take 1 tablet (150 mg total) by mouth 2 (two) times daily. -     QUEtiapine 150 MG TABS; Take one tablet at bedtime.  Generalized anxiety disorder -     ALPRAZolam (XANAX) 0.5 MG tablet; Take one tablet  four times  daily.     Please see After Visit Summary for patient specific instructions.  Future Appointments  Date Time Provider Department Center  02/20/2024  4:30 PM [facility] 2 [facility]    No orders of the defined types were placed in this encounter.     -------------------------------

## 2024-02-20 ENCOUNTER — Ambulatory Visit (HOSPITAL_COMMUNITY)
Admission: RE | Admit: 2024-02-20 | Discharge: 2024-02-20 | Disposition: A | Discharge status: Home / Self Care | Payer: Medicare Other | Source: Ambulatory Visit | Attending: Neurosurgery | Admitting: Neurosurgery

## 2024-02-20 DIAGNOSIS — M960 Pseudarthrosis after fusion or arthrodesis: Secondary | ICD-10-CM | POA: Diagnosis not present

## 2024-02-20 DIAGNOSIS — I700 Atherosclerosis of aorta: Secondary | ICD-10-CM | POA: Insufficient documentation

## 2024-02-20 DIAGNOSIS — N200 Calculus of kidney: Secondary | ICD-10-CM | POA: Insufficient documentation

## 2024-02-20 DIAGNOSIS — S32009K Unspecified fracture of unspecified lumbar vertebra, subsequent encounter for fracture with nonunion: Secondary | ICD-10-CM

## 2024-02-20 DIAGNOSIS — N281 Cyst of kidney, acquired: Secondary | ICD-10-CM | POA: Insufficient documentation

## 2024-02-20 DIAGNOSIS — R935 Abnormal findings on diagnostic imaging of other abdominal regions, including retroperitoneum: Secondary | ICD-10-CM | POA: Diagnosis not present

## 2024-02-20 DIAGNOSIS — R932 Abnormal findings on diagnostic imaging of liver and biliary tract: Secondary | ICD-10-CM | POA: Diagnosis not present

## 2024-02-21 NOTE — Pre-Procedure Instructions (Signed)
 Surgical Instructions   Your procedure is scheduled on March 02, 2024. Report to [facility] Main Entrance "A" at 9:50 A.M., then check in with the Admitting office. Any questions or running late day of surgery: call [phone]  Questions prior to your surgery date: call [phone], Monday-Friday, 8am-4pm. If you experience any cold or flu symptoms such as cough, fever, chills, shortness of breath, etc. between now and your scheduled surgery, please notify us at the above number.     Remember:  Do not eat after midnight the night before your surgery  You may drink clear liquids until 8:50 AM the morning of your surgery.   Clear liquids allowed are: Water, Non-Citrus Juices (without pulp), Carbonated Beverages, Clear Tea (no milk, honey, etc.), Black Coffee Only (NO MILK, CREAM OR POWDERED CREAMER of any kind), and Gatorade.    Take these medicines the morning of surgery with A SIP OF WATER: ALPRAZolam (XANAX)  estradiol (ESTRACE)  lamoTRIgine (LAMICTAL)  levothyroxine (SYNTHROID)  oxyCODONE-acetaminophen (PERCOCET/ROXICET)  pregabalin (LYRICA)  valACYclovir (VALTREX)    May take these medicines IF NEEDED: EPINEPHrine Pen tiZANidine (ZANAFLEX)    One week prior to surgery, STOP taking any Aspirin (unless otherwise instructed by your surgeon) Aleve, Naproxen, Ibuprofen, Motrin, Advil, Goody's, BC's, all herbal medications, fish oil, and non-prescription vitamins.                     Do NOT Smoke (Tobacco/Vaping) for 24 hours prior to your procedure.  If you use a CPAP at night, you may bring your mask/headgear for your overnight stay.   You will be asked to remove any contacts, glasses, piercing's, hearing aid's, dentures/partials prior to surgery. Please bring cases for these items if needed.    Patients discharged the day of surgery will not be allowed to drive home, and someone needs to stay with them for 24 hours.  SURGICAL WAITING ROOM VISITATION Patients may have  no more than 2 support people in the waiting area - these visitors may rotate.   Pre-op nurse will coordinate an appropriate time for 1 ADULT support person, who may not rotate, to accompany patient in pre-op.  Children under the age of 39 must have an adult with them who is not the patient and must remain in the main waiting area with an adult.  If the patient needs to stay at the hospital during part of their recovery, the visitor guidelines for inpatient rooms apply.  Please refer to the [facility] website for the visitor guidelines for any additional information.   If you received a COVID test during your pre-op visit  it is requested that you wear a mask when out in public, stay away from anyone that may not be feeling well and notify your surgeon if you develop symptoms. If you have been in contact with anyone that has tested positive in the last 10 days please notify you surgeon.      Pre-operative 5 CHG Bathing Instructions   You can play a key role in reducing the risk of infection after surgery. Your skin needs to be as free of germs as possible. You can reduce the number of germs on your skin by washing with CHG (chlorhexidine gluconate) soap before surgery. CHG is an antiseptic soap that kills germs and continues to kill germs even after washing.   DO NOT use if you have an allergy to chlorhexidine/CHG or antibacterial soaps. If your skin becomes reddened or irritated, stop using the CHG  and notify one of our RNs at [phone].   Please shower with the CHG soap starting 4 days before surgery using the following schedule:     Please keep in mind the following:  DO NOT shave, including legs and underarms, starting the day of your first shower.   You may shave your face at any point before/day of surgery.  Place clean sheets on your bed the day you start using CHG soap. Use a clean washcloth (not used since being washed) for each shower. DO NOT sleep with pets once you start  using the CHG.   CHG Shower Instructions:  Wash your face and private area with normal soap. If you choose to wash your hair, wash first with your normal shampoo.  After you use shampoo/soap, rinse your hair and body thoroughly to remove shampoo/soap residue.  Turn the water OFF and apply about 3 tablespoons (45 ml) of CHG soap to a CLEAN washcloth.  Apply CHG soap ONLY FROM YOUR NECK DOWN TO YOUR TOES (washing for 3-5 minutes)  DO NOT use CHG soap on face, private areas, open wounds, or sores.  Pay special attention to the area where your surgery is being performed.  If you are having back surgery, having someone wash your back for you may be helpful. Wait 2 minutes after CHG soap is applied, then you may rinse off the CHG soap.  Pat dry with a clean towel  Put on clean clothes/pajamas   If you choose to wear lotion, please use ONLY the CHG-compatible lotions that are listed below.  Additional instructions for the day of surgery: DO NOT APPLY any lotions, deodorants, cologne, or perfumes.   Do not bring valuables to the hospital. [facility] is not responsible for any belongings/valuables. Do not wear nail polish, gel polish, artificial nails, or any other type of covering on natural nails (fingers and toes) Do not wear jewelry or makeup Put on clean/comfortable clothes.  Please brush your teeth.  Ask your nurse before applying any prescription medications to the skin.     CHG Compatible Lotions   Aveeno Moisturizing lotion  Cetaphil Moisturizing Cream  Cetaphil Moisturizing Lotion  Clairol Herbal Essence Moisturizing Lotion, Dry Skin  Clairol Herbal Essence Moisturizing Lotion, Extra Dry Skin  Clairol Herbal Essence Moisturizing Lotion, Normal Skin  Curel Age Defying Therapeutic Moisturizing Lotion with Alpha Hydroxy  Curel Extreme Care Body Lotion  Curel Soothing Hands Moisturizing Hand Lotion  Curel Therapeutic Moisturizing Cream, Fragrance-Free  Curel Therapeutic  Moisturizing Lotion, Fragrance-Free  Curel Therapeutic Moisturizing Lotion, Original Formula  Eucerin Daily Replenishing Lotion  Eucerin Dry Skin Therapy Plus Alpha Hydroxy Crme  Eucerin Dry Skin Therapy Plus Alpha Hydroxy Lotion  Eucerin Original Crme  Eucerin Original Lotion  Eucerin Plus Crme Eucerin Plus Lotion  Eucerin TriLipid Replenishing Lotion  Keri Anti-Bacterial Hand Lotion  Keri Deep Conditioning Original Lotion Dry Skin Formula Softly Scented  Keri Deep Conditioning Original Lotion, Fragrance Free Sensitive Skin Formula  Keri Lotion Fast Absorbing Fragrance Free Sensitive Skin Formula  Keri Lotion Fast Absorbing Softly Scented Dry Skin Formula  Keri Original Lotion  Keri Skin Renewal Lotion Keri Silky Smooth Lotion  Keri Silky Smooth Sensitive Skin Lotion  Nivea Body Creamy Conditioning Oil  Nivea Body Extra Enriched Teacher, adult education Moisturizing Lotion Nivea Crme  Nivea Skin Firming Lotion  NutraDerm 30 Skin Lotion  NutraDerm Skin Lotion  NutraDerm Therapeutic Skin Cream  NutraDerm Therapeutic Skin Lotion  ProShield  Protective Hand Cream  Provon moisturizing lotion  Please read over the following fact sheets that you were given.

## 2024-02-24 ENCOUNTER — Encounter (HOSPITAL_COMMUNITY)
Admission: RE | Admit: 2024-02-24 | Discharge: 2024-02-24 | Disposition: A | Discharge status: Home / Self Care | Source: Ambulatory Visit | Attending: Neurosurgery | Admitting: Neurosurgery

## 2024-02-24 ENCOUNTER — Other Ambulatory Visit: Payer: Self-pay

## 2024-02-24 ENCOUNTER — Encounter (HOSPITAL_COMMUNITY): Payer: Self-pay

## 2024-02-24 VITALS — BP 142/71 | HR 93 | Temp 98.0°F | Resp 17 | Ht 61.0 in | Wt 172.4 lb

## 2024-02-24 DIAGNOSIS — Z01812 Encounter for preprocedural laboratory examination: Secondary | ICD-10-CM | POA: Diagnosis not present

## 2024-02-24 DIAGNOSIS — Z01818 Encounter for other preprocedural examination: Secondary | ICD-10-CM

## 2024-02-24 HISTORY — DX: Personal history of urinary calculi: Z87.442

## 2024-02-24 HISTORY — DX: Headache, unspecified: R51.9

## 2024-02-24 LAB — TYPE AND SCREEN
ABO/RH(D): O POS
Antibody Screen: NEGATIVE

## 2024-02-24 LAB — SURGICAL PCR SCREEN
MRSA, PCR: NEGATIVE
Staphylococcus aureus: POSITIVE — AB

## 2024-02-24 NOTE — Progress Notes (Signed)
 PCP - Dr. Hillard Danker Cardiologist - Denies  PPM/ICD - Denies Device Orders - n/a Rep Notified - n/a  Chest x-ray - n/a EKG - 10/21/2023 Stress Test - Denies ECHO - Denies Cardiac Cath - Denies  Sleep Study - Denies CPAP - n/a  No DM  Last dose of GLP1 agonist- n/a GLP1 instructions: n/a  Blood Thinner Instructions: n/a Aspirin Instructions: n/a  ERAS Protcol - Clear liquids until 0850 morning of surgery PRE-SURGERY Ensure or G2- n/a  COVID TEST- n/a   Anesthesia review: No.   Patient denies shortness of breath, fever, cough and chest pain at PAT appointment. Pt denies any respiratory illness/infection in the last two months.   All instructions explained to the patient, with a verbal understanding of the material. Patient agrees to go over the instructions while at home for a better understanding. Patient also instructed to self quarantine after being tested for COVID-19. The opportunity to ask questions was provided.

## 2024-03-01 NOTE — Anesthesia Preprocedure Evaluation (Signed)
 Anesthesia Evaluation  Patient identified by MRN, date of birth, ID band Patient awake    Reviewed: Allergy & Precautions, H&P , NPO status , Patient's Chart, lab work & pertinent test results  Airway Mallampati: III  TM Distance: >3 FB Neck ROM: Full    Dental  (+) Teeth Intact, Dental Advisory Given   Pulmonary former smoker   Pulmonary exam normal breath sounds clear to auscultation       Cardiovascular hypertension, Pt. on medications Normal cardiovascular exam Rhythm:Regular Rate:Normal     Neuro/Psych  Headaches PSYCHIATRIC DISORDERS Anxiety Depression    Sacral fracture     GI/Hepatic negative GI ROS, Neg liver ROS,,,  Endo/Other  Hypothyroidism  Obesity   Renal/GU negative Renal ROS  negative genitourinary   Musculoskeletal negative musculoskeletal ROS (+) Arthritis ,  Fibromyalgia -, narcotic dependent  Abdominal   Peds  (+) ADHD Hematology negative hematology ROS (+) Blood dyscrasia, anemia   Anesthesia Other Findings   Reproductive/Obstetrics negative OB ROS                             Anesthesia Physical Anesthesia Plan  ASA: 2  Anesthesia Plan: General   Post-op Pain Management: Tylenol PO (pre-op)* and Ketamine IV*   Induction: Intravenous  PONV Risk Score and Plan: 4 or greater and Ondansetron, Dexamethasone and Treatment may vary due to age or medical condition  Airway Management Planned: Oral ETT  Additional Equipment:   Intra-op Plan:   Post-operative Plan: Extubation in OR  Informed Consent: I have reviewed the patients History and Physical, chart, labs and discussed the procedure including the risks, benefits and alternatives for the proposed anesthesia with the patient or authorized representative who has indicated his/her understanding and acceptance.     Dental advisory given  Plan Discussed with: CRNA  Anesthesia Plan Comments:         Anesthesia Quick Evaluation

## 2024-03-02 ENCOUNTER — Inpatient Hospital Stay (HOSPITAL_COMMUNITY): Payer: Self-pay | Admitting: Registered Nurse

## 2024-03-02 ENCOUNTER — Encounter (HOSPITAL_COMMUNITY): Payer: Self-pay | Admitting: Neurosurgery

## 2024-03-02 ENCOUNTER — Other Ambulatory Visit: Payer: Self-pay

## 2024-03-02 ENCOUNTER — Inpatient Hospital Stay (HOSPITAL_BASED_OUTPATIENT_CLINIC_OR_DEPARTMENT_OTHER): Payer: Self-pay | Admitting: Registered Nurse

## 2024-03-02 ENCOUNTER — Inpatient Hospital Stay (HOSPITAL_COMMUNITY)

## 2024-03-02 ENCOUNTER — Encounter (HOSPITAL_COMMUNITY)
Admission: RE | Disposition: A | Discharge status: Home / Self Care | Payer: Self-pay | Source: Home / Self Care | Attending: Neurosurgery

## 2024-03-02 ENCOUNTER — Observation Stay (HOSPITAL_COMMUNITY)
Admission: RE | Admit: 2024-03-02 | Discharge: 2024-03-04 | Disposition: A | Discharge status: Home / Self Care | Payer: Medicare Other | Attending: Neurosurgery | Admitting: Neurosurgery

## 2024-03-02 DIAGNOSIS — Z79899 Other long term (current) drug therapy: Secondary | ICD-10-CM | POA: Diagnosis not present

## 2024-03-02 DIAGNOSIS — Z87891 Personal history of nicotine dependence: Secondary | ICD-10-CM | POA: Diagnosis not present

## 2024-03-02 DIAGNOSIS — M960 Pseudarthrosis after fusion or arthrodesis: Secondary | ICD-10-CM | POA: Diagnosis not present

## 2024-03-02 DIAGNOSIS — Z981 Arthrodesis status: Secondary | ICD-10-CM | POA: Diagnosis not present

## 2024-03-02 DIAGNOSIS — I10 Essential (primary) hypertension: Secondary | ICD-10-CM | POA: Insufficient documentation

## 2024-03-02 DIAGNOSIS — X58XXXA Exposure to other specified factors, initial encounter: Secondary | ICD-10-CM | POA: Diagnosis not present

## 2024-03-02 DIAGNOSIS — M549 Dorsalgia, unspecified: Secondary | ICD-10-CM | POA: Insufficient documentation

## 2024-03-02 DIAGNOSIS — E039 Hypothyroidism, unspecified: Secondary | ICD-10-CM | POA: Insufficient documentation

## 2024-03-02 DIAGNOSIS — M532X7 Spinal instabilities, lumbosacral region: Secondary | ICD-10-CM | POA: Diagnosis not present

## 2024-03-02 DIAGNOSIS — M8008XA Age-related osteoporosis with current pathological fracture, vertebra(e), initial encounter for fracture: Secondary | ICD-10-CM | POA: Diagnosis not present

## 2024-03-02 DIAGNOSIS — S3210XA Unspecified fracture of sacrum, initial encounter for closed fracture: Principal | ICD-10-CM | POA: Diagnosis present

## 2024-03-02 HISTORY — PX: LAMINECTOMY WITH POSTERIOR LATERAL ARTHRODESIS LEVEL 2: SHX6336

## 2024-03-02 HISTORY — PX: APPLICATION OF ROBOTIC ASSISTANCE FOR SPINAL PROCEDURE: SHX6753

## 2024-03-02 SURGERY — LAMINECTOMY WITH POSTERIOR LATERAL ARTHRODESIS LEVEL 2
Anesthesia: General | Site: Back

## 2024-03-02 MED ORDER — METHOCARBAMOL 500 MG PO TABS
500.0000 mg | ORAL_TABLET | Freq: Four times a day (QID) | ORAL | Status: DC | PRN
  Administered 2024-03-02 – 2024-03-04 (×4): 500 mg via ORAL
  Filled 2024-03-02 (×4): qty 1

## 2024-03-02 MED ORDER — SODIUM CHLORIDE 0.9 % IV SOLN
250.0000 mL | INTRAVENOUS | Status: AC
  Administered 2024-03-02: 250 mL via INTRAVENOUS

## 2024-03-02 MED ORDER — PHENYLEPHRINE HCL-NACL 20-0.9 MG/250ML-% IV SOLN
INTRAVENOUS | Status: DC | PRN
  Administered 2024-03-02: 15 ug/min via INTRAVENOUS

## 2024-03-02 MED ORDER — MUPIROCIN 2 % EX OINT
TOPICAL_OINTMENT | CUTANEOUS | Status: AC
  Administered 2024-03-02: 1 via NASAL
  Filled 2024-03-02: qty 22

## 2024-03-02 MED ORDER — CHLORHEXIDINE GLUCONATE CLOTH 2 % EX PADS: 6.0000 | MEDICATED_PAD | Freq: Once | CUTANEOUS | Status: DC

## 2024-03-02 MED ORDER — FENTANYL CITRATE (PF) 100 MCG/2ML IJ SOLN
INTRAMUSCULAR | Status: AC
  Filled 2024-03-02: qty 2

## 2024-03-02 MED ORDER — PROPOFOL 10 MG/ML IV BOLUS
INTRAVENOUS | Status: AC
  Filled 2024-03-02: qty 20

## 2024-03-02 MED ORDER — FENTANYL CITRATE (PF) 250 MCG/5ML IJ SOLN
INTRAMUSCULAR | Status: AC
  Filled 2024-03-02: qty 5

## 2024-03-02 MED ORDER — CHLORHEXIDINE GLUCONATE 0.12 % MT SOLN
OROMUCOSAL | Status: AC
  Administered 2024-03-02: 15 mL via OROMUCOSAL
  Filled 2024-03-02: qty 15

## 2024-03-02 MED ORDER — VALSARTAN-HYDROCHLOROTHIAZIDE 80-12.5 MG PO TABS: 1.0000 | ORAL_TABLET | Freq: Every day | ORAL | Status: DC

## 2024-03-02 MED ORDER — FLEET ENEMA RE ENEM: 1.0000 | ENEMA | Freq: Once | RECTAL | Status: DC | PRN

## 2024-03-02 MED ORDER — OXYCODONE HCL 5 MG PO TABS
10.0000 mg | ORAL_TABLET | ORAL | Status: DC | PRN
  Administered 2024-03-02 – 2024-03-04 (×12): 10 mg via ORAL
  Filled 2024-03-02 (×12): qty 2

## 2024-03-02 MED ORDER — FENTANYL CITRATE (PF) 250 MCG/5ML IJ SOLN
INTRAMUSCULAR | Status: DC | PRN
  Administered 2024-03-02 (×3): 50 ug via INTRAVENOUS
  Administered 2024-03-02: 100 ug via INTRAVENOUS

## 2024-03-02 MED ORDER — THROMBIN 20000 UNITS EX SOLR
CUTANEOUS | Status: AC
  Filled 2024-03-02: qty 20000

## 2024-03-02 MED ORDER — HYDROMORPHONE HCL 1 MG/ML IJ SOLN
INTRAMUSCULAR | Status: AC
  Filled 2024-03-02: qty 1

## 2024-03-02 MED ORDER — LIDOCAINE 2% (20 MG/ML) 5 ML SYRINGE
INTRAMUSCULAR | Status: AC
  Filled 2024-03-02: qty 5

## 2024-03-02 MED ORDER — CHLORHEXIDINE GLUCONATE 0.12 % MT SOLN: 15.0000 mL | Freq: Once | OROMUCOSAL | Status: AC

## 2024-03-02 MED ORDER — FERROUS SULFATE 325 (65 FE) MG PO TABS
325.0000 mg | ORAL_TABLET | Freq: Every day | ORAL | Status: DC
  Administered 2024-03-02 – 2024-03-04 (×3): 325 mg via ORAL
  Filled 2024-03-02 (×3): qty 1

## 2024-03-02 MED ORDER — ESTRADIOL 0.5 MG PO TABS
1.0000 mg | ORAL_TABLET | Freq: Every day | ORAL | Status: DC
  Administered 2024-03-04: 1 mg via ORAL
  Filled 2024-03-02 (×2): qty 2

## 2024-03-02 MED ORDER — MUPIROCIN 2 % EX OINT: 1.0000 | TOPICAL_OINTMENT | Freq: Two times a day (BID) | CUTANEOUS | Status: DC

## 2024-03-02 MED ORDER — ONDANSETRON HCL 4 MG/2ML IJ SOLN: 4.0000 mg | Freq: Four times a day (QID) | INTRAMUSCULAR | Status: DC | PRN

## 2024-03-02 MED ORDER — MIDAZOLAM HCL 2 MG/2ML IJ SOLN
INTRAMUSCULAR | Status: AC
  Filled 2024-03-02: qty 2

## 2024-03-02 MED ORDER — ACETAMINOPHEN 500 MG PO TABS
ORAL_TABLET | ORAL | Status: AC
  Administered 2024-03-02: 1000 mg via ORAL
  Filled 2024-03-02: qty 2

## 2024-03-02 MED ORDER — VALACYCLOVIR HCL 500 MG PO TABS
500.0000 mg | ORAL_TABLET | Freq: Every day | ORAL | Status: DC
  Administered 2024-03-03 – 2024-03-04 (×2): 500 mg via ORAL
  Filled 2024-03-02 (×2): qty 1

## 2024-03-02 MED ORDER — ACETAMINOPHEN 500 MG PO TABS: 1000.0000 mg | ORAL_TABLET | Freq: Once | ORAL | Status: AC

## 2024-03-02 MED ORDER — DEXMEDETOMIDINE HCL IN NACL 80 MCG/20ML IV SOLN
INTRAVENOUS | Status: AC
  Filled 2024-03-02: qty 20

## 2024-03-02 MED ORDER — CHLORHEXIDINE GLUCONATE CLOTH 2 % EX PADS: 6.0000 | MEDICATED_PAD | Freq: Every day | CUTANEOUS | Status: DC

## 2024-03-02 MED ORDER — PHENOL 1.4 % MT LIQD: 1.0000 | OROMUCOSAL | Status: DC | PRN

## 2024-03-02 MED ORDER — VANCOMYCIN HCL 1 G IV SOLR
INTRAVENOUS | Status: DC | PRN
  Administered 2024-03-02: 1000 mg via TOPICAL

## 2024-03-02 MED ORDER — EPHEDRINE SULFATE-NACL 50-0.9 MG/10ML-% IV SOSY
PREFILLED_SYRINGE | INTRAVENOUS | Status: DC | PRN
  Administered 2024-03-02 (×2): 5 mg via INTRAVENOUS

## 2024-03-02 MED ORDER — TURMERIC CURCUMIN 500 MG PO CAPS: ORAL_CAPSULE | Freq: Every day | ORAL | Status: DC

## 2024-03-02 MED ORDER — BUPIVACAINE HCL (PF) 0.25 % IJ SOLN
INTRAMUSCULAR | Status: DC | PRN
  Administered 2024-03-02: 20 mL

## 2024-03-02 MED ORDER — BIOTIN 10 MG PO TABS: 10.0000 mg | ORAL_TABLET | Freq: Every day | ORAL | Status: DC

## 2024-03-02 MED ORDER — VANCOMYCIN HCL 1000 MG IV SOLR
INTRAVENOUS | Status: AC
  Filled 2024-03-02: qty 20

## 2024-03-02 MED ORDER — HYDROMORPHONE HCL 1 MG/ML IJ SOLN: 1.0000 mg | INTRAMUSCULAR | Status: DC | PRN

## 2024-03-02 MED ORDER — ACETAMINOPHEN 10 MG/ML IV SOLN: 1000.0000 mg | Freq: Once | INTRAVENOUS | Status: AC

## 2024-03-02 MED ORDER — FENTANYL CITRATE (PF) 100 MCG/2ML IJ SOLN
25.0000 ug | INTRAMUSCULAR | Status: DC | PRN
  Administered 2024-03-02 (×2): 25 ug via INTRAVENOUS
  Administered 2024-03-02 (×2): 50 ug via INTRAVENOUS

## 2024-03-02 MED ORDER — POLYETHYLENE GLYCOL 3350 17 G PO PACK: 17.0000 g | PACK | Freq: Every day | ORAL | Status: DC | PRN

## 2024-03-02 MED ORDER — VITAMIN D3 25 MCG (1000 UNIT) PO TABS
5000.0000 [IU] | ORAL_TABLET | Freq: Every day | ORAL | Status: DC
  Filled 2024-03-02 (×3): qty 5

## 2024-03-02 MED ORDER — PHENYLEPHRINE 80 MCG/ML (10ML) SYRINGE FOR IV PUSH (FOR BLOOD PRESSURE SUPPORT)
PREFILLED_SYRINGE | INTRAVENOUS | Status: DC | PRN
  Administered 2024-03-02: 160 ug via INTRAVENOUS

## 2024-03-02 MED ORDER — LACTATED RINGERS IV SOLN: INTRAVENOUS | Status: DC

## 2024-03-02 MED ORDER — NAPROXEN 250 MG PO TABS: 500.0000 mg | ORAL_TABLET | Freq: Two times a day (BID) | ORAL | Status: DC | PRN

## 2024-03-02 MED ORDER — ROCURONIUM BROMIDE 10 MG/ML (PF) SYRINGE
PREFILLED_SYRINGE | INTRAVENOUS | Status: DC | PRN
  Administered 2024-03-02: 40 mg via INTRAVENOUS
  Administered 2024-03-02: 30 mg via INTRAVENOUS
  Administered 2024-03-02: 60 mg via INTRAVENOUS

## 2024-03-02 MED ORDER — CEFAZOLIN SODIUM-DEXTROSE 2-4 GM/100ML-% IV SOLN
2.0000 g | INTRAVENOUS | Status: AC
  Administered 2024-03-02: 2 g via INTRAVENOUS

## 2024-03-02 MED ORDER — SUGAMMADEX SODIUM 200 MG/2ML IV SOLN
INTRAVENOUS | Status: DC | PRN
  Administered 2024-03-02: 200 mg via INTRAVENOUS

## 2024-03-02 MED ORDER — ACETAMINOPHEN 325 MG PO TABS: 650.0000 mg | ORAL_TABLET | ORAL | Status: DC | PRN

## 2024-03-02 MED ORDER — ONDANSETRON HCL 4 MG PO TABS: 4.0000 mg | ORAL_TABLET | Freq: Four times a day (QID) | ORAL | Status: DC | PRN

## 2024-03-02 MED ORDER — PREGABALIN 75 MG PO CAPS
150.0000 mg | ORAL_CAPSULE | Freq: Two times a day (BID) | ORAL | Status: DC
  Administered 2024-03-02 – 2024-03-04 (×4): 150 mg via ORAL
  Filled 2024-03-02 (×4): qty 2

## 2024-03-02 MED ORDER — HYDROMORPHONE HCL 1 MG/ML IJ SOLN
INTRAMUSCULAR | Status: AC
  Filled 2024-03-02: qty 0.5

## 2024-03-02 MED ORDER — OXYCODONE HCL 5 MG PO TABS: 5.0000 mg | ORAL_TABLET | ORAL | Status: DC | PRN

## 2024-03-02 MED ORDER — ONDANSETRON HCL 4 MG/2ML IJ SOLN: 4.0000 mg | Freq: Once | INTRAMUSCULAR | Status: DC | PRN

## 2024-03-02 MED ORDER — KETAMINE HCL 50 MG/5ML IJ SOSY
PREFILLED_SYRINGE | INTRAMUSCULAR | Status: AC
  Filled 2024-03-02: qty 5

## 2024-03-02 MED ORDER — ONDANSETRON HCL 4 MG/2ML IJ SOLN
INTRAMUSCULAR | Status: DC | PRN
  Administered 2024-03-02: 4 mg via INTRAVENOUS

## 2024-03-02 MED ORDER — CEFAZOLIN SODIUM-DEXTROSE 1-4 GM/50ML-% IV SOLN
1.0000 g | Freq: Three times a day (TID) | INTRAVENOUS | Status: AC
  Administered 2024-03-02 – 2024-03-03 (×2): 1 g via INTRAVENOUS
  Filled 2024-03-02 (×2): qty 50

## 2024-03-02 MED ORDER — SODIUM CHLORIDE 0.9% FLUSH: 3.0000 mL | INTRAVENOUS | Status: DC | PRN

## 2024-03-02 MED ORDER — ALPRAZOLAM 0.5 MG PO TABS
0.5000 mg | ORAL_TABLET | Freq: Four times a day (QID) | ORAL | Status: DC
  Administered 2024-03-02 – 2024-03-04 (×6): 0.5 mg via ORAL
  Filled 2024-03-02 (×6): qty 1

## 2024-03-02 MED ORDER — LEVOTHYROXINE SODIUM 25 MCG PO TABS
50.0000 ug | ORAL_TABLET | Freq: Every day | ORAL | Status: DC
  Administered 2024-03-03 – 2024-03-04 (×2): 50 ug via ORAL
  Filled 2024-03-02 (×2): qty 2

## 2024-03-02 MED ORDER — DEXAMETHASONE SODIUM PHOSPHATE 10 MG/ML IJ SOLN
INTRAMUSCULAR | Status: AC
  Filled 2024-03-02: qty 1

## 2024-03-02 MED ORDER — AMPHETAMINE-DEXTROAMPHETAMINE 10 MG PO TABS: 10.0000 mg | ORAL_TABLET | Freq: Every day | ORAL | Status: DC

## 2024-03-02 MED ORDER — HYDROMORPHONE HCL 1 MG/ML IJ SOLN
0.2500 mg | INTRAMUSCULAR | Status: DC | PRN
  Administered 2024-03-02: 0.5 mg via INTRAVENOUS

## 2024-03-02 MED ORDER — ACETAMINOPHEN 650 MG RE SUPP: 650.0000 mg | RECTAL | Status: DC | PRN

## 2024-03-02 MED ORDER — 0.9 % SODIUM CHLORIDE (POUR BTL) OPTIME
TOPICAL | Status: DC | PRN
  Administered 2024-03-02: 1000 mL

## 2024-03-02 MED ORDER — CEFAZOLIN SODIUM-DEXTROSE 2-4 GM/100ML-% IV SOLN
INTRAVENOUS | Status: AC
  Filled 2024-03-02: qty 100

## 2024-03-02 MED ORDER — ROCURONIUM BROMIDE 10 MG/ML (PF) SYRINGE
PREFILLED_SYRINGE | INTRAVENOUS | Status: AC
  Filled 2024-03-02: qty 10

## 2024-03-02 MED ORDER — SUGAMMADEX SODIUM 200 MG/2ML IV SOLN
INTRAVENOUS | Status: AC
  Filled 2024-03-02: qty 2

## 2024-03-02 MED ORDER — HYDROCHLOROTHIAZIDE 12.5 MG PO TABS
12.5000 mg | ORAL_TABLET | Freq: Every day | ORAL | Status: DC
  Administered 2024-03-03 – 2024-03-04 (×2): 12.5 mg via ORAL
  Filled 2024-03-02 (×2): qty 1

## 2024-03-02 MED ORDER — LIDOCAINE 2% (20 MG/ML) 5 ML SYRINGE
INTRAMUSCULAR | Status: DC | PRN
  Administered 2024-03-02: 80 mg via INTRAVENOUS

## 2024-03-02 MED ORDER — SODIUM CHLORIDE 0.9% FLUSH
3.0000 mL | Freq: Two times a day (BID) | INTRAVENOUS | Status: DC
  Administered 2024-03-03: 3 mL via INTRAVENOUS

## 2024-03-02 MED ORDER — KETAMINE HCL 10 MG/ML IJ SOLN
INTRAMUSCULAR | Status: DC | PRN
Start: 2024-03-02 — End: 2024-03-02
  Administered 2024-03-02: 10 mg via INTRAVENOUS
  Administered 2024-03-02: 25 mg via INTRAVENOUS

## 2024-03-02 MED ORDER — HEMOSTATIC AGENTS (NO CHARGE) OPTIME
TOPICAL | Status: DC | PRN
  Administered 2024-03-02: 1 via TOPICAL

## 2024-03-02 MED ORDER — ONDANSETRON HCL 4 MG/2ML IJ SOLN
INTRAMUSCULAR | Status: AC
  Filled 2024-03-02: qty 2

## 2024-03-02 MED ORDER — ORAL CARE MOUTH RINSE: 15.0000 mL | Freq: Once | OROMUCOSAL | Status: AC

## 2024-03-02 MED ORDER — LAMOTRIGINE 100 MG PO TABS
150.0000 mg | ORAL_TABLET | Freq: Two times a day (BID) | ORAL | Status: DC
Start: 2024-03-02 — End: 2024-03-04
  Administered 2024-03-03 – 2024-03-04 (×2): 150 mg via ORAL
  Filled 2024-03-02 (×2): qty 2

## 2024-03-02 MED ORDER — IRBESARTAN 75 MG PO TABS
75.0000 mg | ORAL_TABLET | Freq: Every day | ORAL | Status: DC
  Administered 2024-03-03: 75 mg via ORAL
  Filled 2024-03-02 (×2): qty 1

## 2024-03-02 MED ORDER — MIDAZOLAM HCL 2 MG/2ML IJ SOLN
INTRAMUSCULAR | Status: DC | PRN
  Administered 2024-03-02: 2 mg via INTRAVENOUS

## 2024-03-02 MED ORDER — BUPIVACAINE HCL (PF) 0.25 % IJ SOLN
INTRAMUSCULAR | Status: AC
  Filled 2024-03-02: qty 30

## 2024-03-02 MED ORDER — ACETAMINOPHEN 10 MG/ML IV SOLN
INTRAVENOUS | Status: AC
  Administered 2024-03-02: 1000 mg via INTRAVENOUS
  Filled 2024-03-02: qty 100

## 2024-03-02 MED ORDER — METHOCARBAMOL 1000 MG/10ML IJ SOLN: 500.0000 mg | Freq: Four times a day (QID) | INTRAMUSCULAR | Status: DC | PRN

## 2024-03-02 MED ORDER — PROPOFOL 10 MG/ML IV BOLUS
INTRAVENOUS | Status: DC | PRN
  Administered 2024-03-02: 170 mg via INTRAVENOUS

## 2024-03-02 MED ORDER — BISACODYL 10 MG RE SUPP: 10.0000 mg | Freq: Every day | RECTAL | Status: DC | PRN

## 2024-03-02 MED ORDER — DEXAMETHASONE SODIUM PHOSPHATE 10 MG/ML IJ SOLN
INTRAMUSCULAR | Status: DC | PRN
  Administered 2024-03-02: 10 mg via INTRAVENOUS

## 2024-03-02 MED ORDER — EPHEDRINE 5 MG/ML INJ
INTRAVENOUS | Status: AC
  Filled 2024-03-02: qty 5

## 2024-03-02 MED ORDER — MENTHOL 3 MG MT LOZG: 1.0000 | LOZENGE | OROMUCOSAL | Status: DC | PRN

## 2024-03-02 SURGICAL SUPPLY — 62 items
BAG COUNTER SPONGE SURGICOUNT (BAG) ×4 IMPLANT
BENZOIN TINCTURE PRP APPL 2/3 (GAUZE/BANDAGES/DRESSINGS) ×2 IMPLANT
BIT DRILL LONG 3.0X30 (BIT) IMPLANT
BIT DRILL LONG 3X80 (BIT) IMPLANT
BIT DRILL LONG 4X80 (BIT) IMPLANT
BIT DRILL SHORT 3.0X30 (BIT) IMPLANT
BIT DRILL SHORT 3X80 (BIT) IMPLANT
BLADE CLIPPER SURG (BLADE) IMPLANT
BLADE SURG 11 STRL SS (BLADE) ×2 IMPLANT
BUR CUTTER 7.0 ROUND (BURR) IMPLANT
BUR MATCHSTICK NEURO 3.0 LAGG (BURR) ×2 IMPLANT
CANISTER SUCT 3000ML PPV (MISCELLANEOUS) ×2 IMPLANT
CNTNR URN SCR LID CUP LEK RST (MISCELLANEOUS) ×2 IMPLANT
CONN SPINAL HOR 5/6 DT (Connector) ×4 IMPLANT
CONNECTOR SPINAL HOR 5/6 DT (Connector) IMPLANT
COVER BACK TABLE 60X90IN (DRAPES) ×2 IMPLANT
DERMABOND ADVANCED .7 DNX12 (GAUZE/BANDAGES/DRESSINGS) ×2 IMPLANT
DRAPE C-ARM 42X72 X-RAY (DRAPES) ×4 IMPLANT
DRAPE HALF SHEET 40X57 (DRAPES) IMPLANT
DRAPE LAPAROTOMY 100X72X124 (DRAPES) ×2 IMPLANT
DRAPE SHEET LG 3/4 BI-LAMINATE (DRAPES) ×2 IMPLANT
DRAPE SURG 17X23 STRL (DRAPES) ×8 IMPLANT
DRSG OPSITE POSTOP 4X8 (GAUZE/BANDAGES/DRESSINGS) IMPLANT
DURAPREP 26ML APPLICATOR (WOUND CARE) ×2 IMPLANT
ELECT BLADE 4.0 EZ CLEAN MEGAD (MISCELLANEOUS) IMPLANT
ELECT REM PT RETURN 9FT ADLT (ELECTROSURGICAL) ×2 IMPLANT
ELECTRODE BLDE 4.0 EZ CLN MEGD (MISCELLANEOUS) IMPLANT
ELECTRODE REM PT RTRN 9FT ADLT (ELECTROSURGICAL) ×2 IMPLANT
EVACUATOR 1/8 PVC DRAIN (DRAIN) ×2 IMPLANT
GAUZE 4X4 16PLY ~~LOC~~+RFID DBL (SPONGE) IMPLANT
GAUZE SPONGE 4X4 12PLY STRL (GAUZE/BANDAGES/DRESSINGS) ×2 IMPLANT
GLOVE ECLIPSE 9.0 STRL (GLOVE) ×4 IMPLANT
GLOVE EXAM NITRILE XL STR (GLOVE) IMPLANT
GOWN STRL REUS W/ TWL LRG LVL3 (GOWN DISPOSABLE) ×2 IMPLANT
GOWN STRL REUS W/ TWL XL LVL3 (GOWN DISPOSABLE) ×4 IMPLANT
GOWN STRL REUS W/TWL 2XL LVL3 (GOWN DISPOSABLE) IMPLANT
HEMOSTAT POWDER KIT SURGIFOAM (HEMOSTASIS) IMPLANT
KIT BASIN OR (CUSTOM PROCEDURE TRAY) ×2 IMPLANT
KIT SPINE MAZOR X ROBO DISP (MISCELLANEOUS) ×2 IMPLANT
KIT TURNOVER KIT B (KITS) ×2 IMPLANT
MARKER SPHERE PSV REFLC NDI (MISCELLANEOUS) IMPLANT
NDL HYPO 22X1.5 SAFETY MO (MISCELLANEOUS) ×2 IMPLANT
NEEDLE HYPO 22X1.5 SAFETY MO (MISCELLANEOUS) ×2 IMPLANT
NS IRRIG 1000ML POUR BTL (IV SOLUTION) ×2 IMPLANT
PACK LAMINECTOMY NEURO (CUSTOM PROCEDURE TRAY) ×2 IMPLANT
PIN HEAD 2.5X60MM (PIN) IMPLANT
POWDER SURGICEL 3.0 GRAM (HEMOSTASIS) IMPLANT
ROD SOLERA 80X5.5XNS TI (Rod) IMPLANT
SCREW BS CNCMA S 8.5X80 T/C (Screw) IMPLANT
SCREW SCHANZ SA 4.0MM (MISCELLANEOUS) IMPLANT
SCREW SET SOLERA TI5.5 (Screw) IMPLANT
SPIKE FLUID TRANSFER (MISCELLANEOUS) ×2 IMPLANT
SPONGE SURGIFOAM ABS GEL 100 (HEMOSTASIS) ×2 IMPLANT
STRIP CLOSURE SKIN 1/2X4 (GAUZE/BANDAGES/DRESSINGS) ×4 IMPLANT
SUT VIC AB 0 CT1 18XCR BRD8 (SUTURE) ×4 IMPLANT
SUT VIC AB 2-0 CT1 18 (SUTURE) ×2 IMPLANT
SUT VIC AB 3-0 SH 8-18 (SUTURE) ×4 IMPLANT
TOWEL GREEN STERILE (TOWEL DISPOSABLE) ×2 IMPLANT
TOWEL GREEN STERILE FF (TOWEL DISPOSABLE) ×2 IMPLANT
TRAY FOLEY MTR SLVR 16FR STAT (SET/KITS/TRAYS/PACK) ×2 IMPLANT
TUBE MAZOR SA REDUCTION (TUBING) ×2 IMPLANT
WATER STERILE IRR 1000ML POUR (IV SOLUTION) ×2 IMPLANT

## 2024-03-02 NOTE — Anesthesia Postprocedure Evaluation (Signed)
 Anesthesia Post Note  Patient: Carol Duncan  Procedure(s) Performed: Revision posterior lateral fusion - Lumbar Four - Pelvis with iliac screw fixation, Mazor robot (Back) APPLICATION OF ROBOTIC ASSISTANCE FOR SPINAL PROCEDURE     Patient location during evaluation: PACU Anesthesia Type: General Level of consciousness: awake and alert Pain management: pain level controlled Vital Signs Assessment: post-procedure vital signs reviewed and stable Respiratory status: spontaneous breathing, nonlabored ventilation, respiratory function stable and patient connected to nasal cannula oxygen Cardiovascular status: blood pressure returned to baseline and stable Postop Assessment: no apparent nausea or vomiting Anesthetic complications: no   No notable events documented.  Last Vitals:  Vitals:   03/02/24 1700 03/02/24 1713  BP: (!) 145/68 129/77  Pulse: 66 63  Resp: 18 20  Temp:  36.7 C  SpO2: 97% 96%    Last Pain:  Vitals:   03/02/24 1713  TempSrc: Oral  PainSc:                  Collene Schlichter

## 2024-03-02 NOTE — Brief Op Note (Signed)
 03/02/2024  2:47 PM  PATIENT:  Carol Duncan  73 y.o. female  PRE-OPERATIVE DIAGNOSIS:  Sacral fracture  POST-OPERATIVE DIAGNOSIS:  Sacral fracture  PROCEDURE:  Procedure(s): Revision posterior lateral fusion - Lumbar Four - Pelvis with iliac screw fixation, Mazor robot (N/A) APPLICATION OF ROBOTIC ASSISTANCE FOR SPINAL PROCEDURE (N/A)  SURGEON:  Surgeons and Role:    Julio Sicks, MD - Primary  PHYSICIAN ASSISTANT:   ASSISTANTSMarland Mcalpine   ANESTHESIA:   general  EBL:  150 mL   BLOOD ADMINISTERED:none  DRAINS: none   LOCAL MEDICATIONS USED:  MARCAINE     SPECIMEN:  No Specimen  DISPOSITION OF SPECIMEN:  N/A  COUNTS:  YES  TOURNIQUET:  * No tourniquets in log *  DICTATION: .Dragon Dictation  PLAN OF CARE: Admit to inpatient   PATIENT DISPOSITION:  PACU - hemodynamically stable.   Delay start of Pharmacological VTE agent (>24hrs) due to surgical blood loss or risk of bleeding: yes

## 2024-03-02 NOTE — Progress Notes (Signed)
 Orthopedic Tech Progress Note Patient Details:  Carol Duncan December 15, 1951 [id number]  PT has back brace  Patient ID: Carol Duncan, female   DOB: July 02, 1951, 73 y.o.   MRN: [id number]  Carol Duncan 03/02/2024, 4:05 PM

## 2024-03-02 NOTE — Anesthesia Procedure Notes (Signed)
 Procedure Name: Intubation Date/Time: 03/02/2024 12:32 PM  Performed by: Gus Puma, CRNAPre-anesthesia Checklist: Patient identified, Emergency Drugs available, Suction available and Patient being monitored Patient Re-evaluated:Patient Re-evaluated prior to induction Oxygen Delivery Method: Circle System Utilized Preoxygenation: Pre-oxygenation with 100% oxygen Induction Type: IV induction Ventilation: Mask ventilation without difficulty Laryngoscope Size: Glidescope and 3 Grade View: Grade I Tube type: Oral Number of attempts: 1 Airway Equipment and Method: Rigid stylet Placement Confirmation: ETT inserted through vocal cords under direct vision, positive ETCO2 and breath sounds checked- equal and bilateral Secured at: 21 cm Tube secured with: Tape Dental Injury: Teeth and Oropharynx as per pre-operative assessment

## 2024-03-02 NOTE — Transfer of Care (Signed)
 Immediate Anesthesia Transfer of Care Note  Patient: Carol Duncan  Procedure(s) Performed: Revision posterior lateral fusion - Lumbar Four - Pelvis with iliac screw fixation, Mazor robot (Back) APPLICATION OF ROBOTIC ASSISTANCE FOR SPINAL PROCEDURE  Patient Location: PACU  Anesthesia Type:General  Level of Consciousness: drowsy and patient cooperative  Airway & Oxygen Therapy: Patient Spontanous Breathing and Patient connected to face mask oxygen  Post-op Assessment: Report given to RN and Post -op Vital signs reviewed and stable  Post vital signs: Reviewed and stable  Last Vitals:  Vitals Value Taken Time  BP 158/73 03/02/24 1500  Temp    Pulse 71 03/02/24 1503  Resp 13 03/02/24 1503  SpO2 96 % 03/02/24 1503  Vitals shown include unfiled device data.  Last Pain:  Vitals:   03/02/24 1019  TempSrc:   PainSc: 9          Complications: No notable events documented.

## 2024-03-02 NOTE — Op Note (Signed)
 Date of procedure: 03/02/2024  Date of dictation: Same  Service: Neurosurgery  Preoperative diagnosis: Sacral fracture status post L4-S1 fusion.  Postoperative diagnosis: Same  Procedure Name: Reexploration of L4-L5 and S1 posterior lateral fusion with augmentation  Fixation from L4-5 to her iliac wings utilizing iliac wing screws.  Intraoperative robot stereotactic guidance for iliac wing screw placement  Surgeon:Edder Bellanca A.Fryda Molenda, M.D.  Asst. Surgeon: Doran Durand, NP  Anesthesia: General  Indication: 73 year old female status post remote L4-5 decompression and fusion and more recent L5-S1 decompression and fusion.  Patient initially did well after her most recent fusion surgery but developed severe lumbosacral pain with some radiation.  Workup demonstrated evidence of a fracture through her sacral ala and mid position of her sacrum.  This was treated initially with sacroplasty with partial improvement of her symptoms but patient with still severe back pain with prolonged standing or sitting.  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  Plan is for reexploration of her L5-S1 fusion with additional instrumentation from L4-5 to her iliac wings bilaterally utilizing robot stereotactic guidance.  Operative note: After induction of anesthesia, patient position prone onto the [facility] table and appropriately padded.  Patient's lumbar region prepped and draped sterilely.  The Mazor robot system was prepped and draped.  Preoperative planning was performed for placement of iliac wing screws bilaterally.  The previous incision was reopened and extended somewhat caudally.  Dissection performed bilaterally exposing her previous pedicle screws potation from L4-S1.  Instrumentation appeared solid without obvious fracturing or loosening.  A second stab incision was made over her posterior superior iliac spine on the right side.  A Steinmann pin was then passed into the PSIS for anchor fixation  and the robot guidance system was secured and attached.  Fluoroscopic images were then merged with the Mazor computer and stereotactic's was provided.  The interval between the screw heads of L4 and L5 were dissected free.  Lateral connectors were then placed along the rod between the screw heads of L4 and L5.  Locking cap was placed overlying the existing rod and finger tightened.  The robot system was then used to dock into the iliac wing starting first on the right side.  Pilot hole was drilled under stereotactic guidance.  The pilot hole was then tapped again under stereotactic guidance and then a 8.5 x 80 mm Medtronic iliac bolt was placed on the right side with good purchase.  Procedures then repeated on the left side again without complication.  Intraoperative fluoroscopy was used which confirmed good positioning of the iliac instrumentation without any obvious complicating features.  Rod was then placed from the L4-5 connector through the iliac bolt and locking caps were secured in place.  Final tightening was achieved bilaterally.  At this point solid fixation appeared to been achieved.  Wound was then irrigated.  It was then closed in layers with Vicryl sutures.  Steri-Strips and sterile dressing were applied.  No apparent complications.  Patient tolerated procedure well and she returns to the recovery room postop.

## 2024-03-02 NOTE — H&P (Signed)
 Carol Duncan is an 73 y.o. female.   Chief Complaint: Back pain HPI: 73 year old female status post remote L4-5 decompression and fusion and more recent L5-S1 decompression and fusion.  Postoperative recovery from L5-S1 fusion complicated by sacral fracture.  Patient has been treated with sacroplasty with incomplete resolution of her symptoms.  She continues to have severe back pain particular with prolonged sitting or standing.  She has minimal radicular symptoms.  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  She presents now for iliac wing fixation tying into her existing contract for stabilization of her sacral fracture.  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   Anxiety    Arthritis    Bile duct abnormality    Complication of anesthesia    Takes alot to keep patient under anesthesia   Depression    Diverticulitis    Fibromyalgia    Headache    Sinus Headaches   History of kidney stones    Hypertension    Hypothyroidism     Past Surgical History:  Procedure Laterality Date   ABDOMINAL HYSTERECTOMY  2000   APPENDECTOMY     during hysterectomy   CATARACT EXTRACTION W/ INTRAOCULAR LENS IMPLANT Bilateral    CHOLECYSTECTOMY N/A 02/27/2016   Procedure: LAPAROSCOPIC CHOLECYSTECTOMY WITH INTRAOPERATIVE CHOLANGIOGRAM;  Surgeon: Romie Levee, MD;  Location: [facility];  Service: General;  Laterality: N/A;   COLONOSCOPY     IR SACROPLASTY BILATERAL  11/27/2023   LUMBAR FUSION  10/2023   Dr. Jordan Likes   LUMBAR FUSION  2001   MYOMECTOMY     1984, 1988, 1992   TONSILLECTOMY     As a child    Family History  Problem Relation Age of Onset   Brain cancer Mother    Heart attack Father    Social History:  reports that she has quit smoking. Her smoking use included cigarettes. She has never used smokeless tobacco. She reports that she does not currently use  alcohol. She reports that she does not use drugs.  Allergies:  Allergies  Allergen Reactions   Alpha-Gal Anaphylaxis   Shellfish Allergy     Shrimp ok,  Scallops, clams, oysters give angioedema  Other Reaction(s): Not available   Beef-Derived Drug Products    Lambs Quarters    Pork-Derived Products     Medications Prior to Admission  Medication Sig Dispense Refill   ADDERALL 20 MG tablet Take 10 mg by mouth daily.     ALPRAZolam (XANAX) 0.5 MG tablet Take one tablet four times daily. 120 tablet 2   Biotin 10 MG TABS Take 10 mg by mouth daily.     Cholecalciferol (VITAMIN D3) 125 MCG (5000 UT) TABS Take 5,000 Units by mouth daily.     EPINEPHrine 0.3 mg/0.3 mL IJ SOAJ injection Inject 0.3 mg into the muscle as needed for anaphylaxis. Call EMS if you use this. 2 each 1   estradiol (ESTRACE) 1 MG tablet Take 1 mg by mouth daily.     ferrous sulfate 325 (65 FE) MG tablet Take 325 mg by mouth daily.     lamoTRIgine (LAMICTAL) 150 MG tablet Take 1 tablet (150 mg total) by mouth 2 (two) times daily. 60 tablet 5   levothyroxine (SYNTHROID) 50 MCG tablet Take 50 mcg by mouth daily before breakfast.     naproxen (NAPROSYN) 500 MG tablet Take 500  mg by mouth 2 (two) times daily as needed for mild pain.     oxyCODONE-acetaminophen (PERCOCET/ROXICET) 5-325 MG tablet Take 1 tablet by mouth in the morning, at noon, in the evening, and at bedtime.     pregabalin (LYRICA) 150 MG capsule Take 150 mg by mouth 2 (two) times daily.     QUEtiapine 150 MG TABS Take one tablet at bedtime. 30 tablet 5   tiZANidine (ZANAFLEX) 4 MG tablet Take 1 tablet (4 mg total) by mouth every 6 (six) hours as needed for muscle spasms. (Patient taking differently: Take 4 mg by mouth in the morning and at bedtime.) 30 tablet 0   TURMERIC CURCUMIN PO Take 1 capsule by mouth daily.     valACYclovir (VALTREX) 500 MG tablet Take 500 mg by mouth daily.     valsartan-hydrochlorothiazide (DIOVAN-HCT) 80-12.5 MG tablet Take 1  tablet by mouth daily.     HYDROcodone-acetaminophen (NORCO) 10-325 MG tablet Take 1 tablet by mouth every 4 (four) hours as needed for severe pain (pain score 7-10) (takes one-half tablet). (Patient not taking: Reported on 02/21/2024) 40 tablet 0    No results found for this or any previous visit (from the past 48 hours). No results found.  Pertinent items noted in HPI and remainder of comprehensive ROS otherwise negative.  Blood pressure (!) 141/81, pulse 80, temperature 98.7 F (37.1 C), temperature source Oral, resp. rate 18, height 5\' 1"  (1.549 m), weight 77.1 kg, SpO2 96%.  Patient is awake and alert.  She is oriented and appropriate.  Motor and sensory function intact.  Wound clean and dry.  Chest and abdomen are benign.  Extremities free from injury deformity.  Examination head ears eyes nose and throat is unremarkable. Assessment/Plan Sacral fracture with intractable pain status post L5-S1 decompression and fusion surgery.  Plan iliac wing fixation utilizing robot guidance with revision of her posterior lateral instrumentation from L4-S1.  Risks and benefits been explained.  Patient wishes proceed.  Carol Duncan Carol Duncan 03/02/2024, 11:36 AM

## 2024-03-03 ENCOUNTER — Encounter (HOSPITAL_COMMUNITY): Payer: Self-pay | Admitting: Neurosurgery

## 2024-03-03 DIAGNOSIS — I10 Essential (primary) hypertension: Secondary | ICD-10-CM | POA: Diagnosis not present

## 2024-03-03 DIAGNOSIS — M549 Dorsalgia, unspecified: Secondary | ICD-10-CM | POA: Diagnosis not present

## 2024-03-03 DIAGNOSIS — M960 Pseudarthrosis after fusion or arthrodesis: Secondary | ICD-10-CM | POA: Diagnosis not present

## 2024-03-03 DIAGNOSIS — E039 Hypothyroidism, unspecified: Secondary | ICD-10-CM | POA: Diagnosis not present

## 2024-03-03 DIAGNOSIS — S3210XA Unspecified fracture of sacrum, initial encounter for closed fracture: Secondary | ICD-10-CM | POA: Diagnosis not present

## 2024-03-03 DIAGNOSIS — Z87891 Personal history of nicotine dependence: Secondary | ICD-10-CM | POA: Diagnosis not present

## 2024-03-03 MED ORDER — QUETIAPINE FUMARATE 50 MG PO TABS
150.0000 mg | ORAL_TABLET | Freq: Every day | ORAL | Status: DC
  Administered 2024-03-03: 150 mg via ORAL
  Filled 2024-03-03 (×2): qty 1

## 2024-03-03 MED ORDER — METHOCARBAMOL 500 MG PO TABS
500.0000 mg | ORAL_TABLET | Freq: Four times a day (QID) | ORAL | 1 refills | Status: AC | PRN
Start: 2024-03-03 — End: ?

## 2024-03-03 MED ORDER — OXYCODONE HCL 10 MG PO TABS: 10.0000 mg | ORAL_TABLET | ORAL | 0 refills | Status: AC | PRN

## 2024-03-03 NOTE — Care Management CC44 (Signed)
 Condition Code 44 Documentation Completed  Patient Details  Name: Carol Duncan MRN: [id number] Date of Birth: 14-Oct-1951   Condition Code 44 given:  Yes Patient signature on Condition Code 44 notice:  Yes Documentation of 2 MD's agreement:  Yes Code 44 added to claim:  Yes    Carol Ide Kiyoko Mcguirt, RN 03/03/2024, 12:09 PM

## 2024-03-03 NOTE — Evaluation (Signed)
 Occupational Therapy Evaluation Patient Details Name: Carol Duncan MRN: [id number] DOB: 1951-07-07 Today's Date: 03/03/2024   History of Present Illness   73 y/o female who presents 03/02/2024 for lumbar surgery. Pt had a recent lumbar fusion in 10/2023 complicated by post-op fall at home, sustaining a sacral fracture. She is now s/p extension of lumbosacral fusion with pelvic fixation L4-S1. PMH significant for IR sacroplasty 11/27/2023, lumbar fusion 2001, 2004, ADHD, diverticulitis, fibromyalgia, HA, HTN, hypothyroidism.     Clinical Impressions Patient evaluated by Occupational Therapy with no further acute OT needs identified. All education has been completed and the patient has no further questions. Pt able to verbalize and follow all precautions at this time. Pt dressing monitored before and after all mobility to ensure the dressing was intact/ dry. See below for any follow-up Occupational Therapy or equipment needs. OT to sign off. Thank you for referral.   Call me [name]     If plan is discharge home, recommend the following:   Assist for transportation     Functional Status Assessment   Patient has had a recent decline in their functional status and demonstrates the ability to make significant improvements in function in a reasonable and predictable amount of time.     Equipment Recommendations         Recommendations for Other Services         Precautions/Restrictions   Precautions Precautions: Fall;Back Precaution Booklet Issued: Yes (comment) Recall of Precautions/Restrictions: Intact Precaution/Restrictions Comments: handout for back precautions provided and reviewed for adls. Required Braces or Orthoses: Spinal Brace Spinal Brace: Lumbar corset;Applied in standing position (Per pt request) Restrictions Weight Bearing Restrictions Per Provider Order: No     Mobility Bed Mobility Overal bed mobility: Modified Independent              General bed mobility comments: sit to supine on the R side of the bed    Transfers Overall transfer level: Modified independent                 General transfer comment: pt navigating the room surface without RW use when offered      Balance Overall balance assessment: Mild deficits observed, not formally tested                                         ADL either performed or assessed with clinical judgement   ADL Overall ADL's : Modified independent                                       General ADL Comments: pt completed toilet transfer with hygiene, sink level grooming, application of chapstick due to complaint of R side of lip, (A) with ordering a lunch. return to supine in R side lying position. The dressing was checked prior to mobilty and post mobility with dry dressing no drainage noted at all. Pt did need mod cues to correctly apply the back brace right side up in standing.     Vision Patient Visual Report: No change from baseline       Perception         Praxis         Pertinent Vitals/Pain Pain Assessment Pain Assessment: Faces Faces Pain Scale: Hurts a little bit Pain Location: Incision site. Pt reports  high pain, however faces pain scale 2/10 Pain Descriptors / Indicators: Operative site guarding, Sore Pain Intervention(s): Monitored during session, Repositioned, Limited activity within patient's tolerance (pt most concerned about bleeding from dressing and messing up her linens at home)     Extremity/Trunk Assessment Upper Extremity Assessment Upper Extremity Assessment: Overall WFL for tasks assessed   Lower Extremity Assessment Lower Extremity Assessment: Defer to PT evaluation LLE Deficits / Details: Pt reports LLE feels weaker however with functional mobility no knee buckling or antalgic gait.   Cervical / Trunk Assessment Cervical / Trunk Assessment: Back Surgery   Communication  Communication Communication: No apparent difficulties   Cognition Arousal: Alert Behavior During Therapy: Anxious, Restless Cognition: No family/caregiver present to determine baseline, No apparent impairments                               Following commands: Intact       Cueing  General Comments   Cueing Techniques: Verbal cues  incision dry and intact at this time. Dressing was changed within the last hour per RN and pt   Exercises     Shoulder Instructions      Home Living Family/patient expects to be discharged to:: Private residence Living Arrangements: Non-relatives/Friends (Roommate who works as a Water engineer) Available Help at Discharge: Friend(s);Neighbor;Available PRN/intermittently Type of Home: House Home Access: Stairs to enter [employer] of Steps: 1+1 Entrance Stairs-Rails: None Home Layout: One level     Bathroom Shower/Tub: Chief Strategy Officer: Standard Bathroom Accessibility: Yes   Home Equipment: Hand held shower head;Tub bench;Rolling Walker (2 wheels)   Additional Comments: pt has a dog named Happi that is a labadoodle 55 yo      Prior Functioning/Environment Prior Level of Function : Independent/Modified Independent;Driving             Mobility Comments: Using walker for longer walks and to walk out to let dog out in the yard. ADLs Comments: Using tub bench    OT Problem List:     OT Treatment/Interventions:        OT Goals(Current goals can be found in the care plan section)   Acute Rehab OT Goals Patient Stated Goal: to stop the bleeding of wound prior to home d/c   OT Frequency:       Co-evaluation              AM-PAC OT "6 Clicks" Daily Activity     Outcome Measure Help from another person eating meals?: None Help from another person taking care of personal grooming?: None Help from another person toileting, which includes using toliet, bedpan, or urinal?: None Help  from another person bathing (including washing, rinsing, drying)?: None Help from another person to put on and taking off regular upper body clothing?: None Help from another person to put on and taking off regular lower body clothing?: None 6 Click Score: 24   End of Session Equipment Utilized During Treatment: Back brace Nurse Communication: Mobility status;Precautions  Activity Tolerance: Patient tolerated treatment well Patient left: in bed;with call bell/phone within reach  OT Visit Diagnosis: Unsteadiness on feet (R26.81)                Time: 1029-1100 OT Time Calculation (min): 31 min Charges:  OT General Charges $OT Visit: 1 Visit OT Evaluation $OT Eval Moderate Complexity: 1 Mod OT Treatments $Self Care/Home Management : 8-22 mins  Timmothy Euler, OTR/L  Acute Rehabilitation Services Office: [phone] .   Mateo Flow 03/03/2024, 11:24 AM

## 2024-03-03 NOTE — Evaluation (Signed)
 Physical Therapy Evaluation  Patient Details Name: Carol Duncan MRN: [id number] DOB: [birth year] Today's Date: 03/03/2024  History of Present Illness  Pt is a 74 y/o female who presents 03/02/2024 for lumbar surgery. Pt had a recent lumbar fusion in 10/2023 complicated by post-op fall at home, sustaining a sacral fracture. She is now s/p extension of lumbosacral fusion with pelvic fixation L4-S1. PMH significant for IR sacroplasty 11/27/2023, lumbar fusion 2001, 2004, ADHD, diverticulitis, fibromyalgia, HA, HTN, hypothyroidism.  Clinical Impression  Pt admitted with above diagnosis. At the time of PT eval, pt was able to demonstrate transfers and ambulation with gross modified independence to CGA and [facility] for support. Pt was educated on precautions, brace application/wearing schedule, appropriate activity progression, and car transfer. Pt currently with functional limitations due to the deficits listed below (see PT Problem List). Pt will benefit from skilled PT to increase their independence and safety with mobility to allow discharge to the venue listed below.          If plan is discharge home, recommend the following: A little help with walking and/or transfers;A little help with bathing/dressing/bathroom;Assistance with cooking/housework;Help with stairs or ramp for entrance;Assist for transportation   Can travel by private vehicle        Equipment Recommendations None recommended by PT  Recommendations for Other Services       Functional Status Assessment Patient has had a recent decline in their functional status and demonstrates the ability to make significant improvements in function in a reasonable and predictable amount of time.     Precautions / Restrictions Precautions Precautions: Fall;Back Precaution Booklet Issued: Yes (comment) Recall of Precautions/Restrictions: Intact Precaution/Restrictions Comments: Reviewed handout and pt was cued for precautions during  functional mobility. Required Braces or Orthoses: Spinal Brace Spinal Brace: Lumbar corset;Applied in standing position (Per pt request) Restrictions Weight Bearing Restrictions Per Provider Order: No      Mobility  Bed Mobility Overal bed mobility: Modified Independent             General bed mobility comments: HOB flat with min use of rails. No assist required.    Transfers Overall transfer level: Modified independent Equipment used: None               General transfer comment: No UE support in room. No unsteadiness or LOB noted but pt appears guarded    Ambulation/Gait Ambulation/Gait assistance: Contact guard assist Gait Distance (Feet): 250 Feet Assistive device: None, Straight cane Gait Pattern/deviations: Step-through pattern, Decreased stride length Gait velocity: Decreased Gait velocity interpretation: 1.31 - 2.62 ft/sec, indicative of limited community ambulator   General Gait Details: Slow and guarded throughout. Initially without UE support as pt declined the RW. Offered SPC and pt open to this. Once cane in hand pt reports she does not like that either. She did not feel she wanted to switch sides the cane was on to try, and did not want to return to the RW, however did not want to stop using the cane either despite not liking it. Discussed options at end of session and pt reports she would prefer to continue using her RW at home.  Stairs Stairs: Yes Stairs assistance: Contact guard assist Stair Management: One rail Right, Step to pattern, Forwards Number of Stairs: 2 General stair comments: Pt demonstrated good negotiation of stairs without unsteadiness or LOB.  Wheelchair Mobility     Tilt Bed    Modified Rankin (Stroke Patients Only)       Balance Overall  balance assessment: Mild deficits observed, not formally tested                                           Pertinent Vitals/Pain Pain Assessment Pain Assessment:  Faces Faces Pain Scale: Hurts a little bit Pain Location: Incision site. Pt reports high pain, however faces pain scale 2/10 Pain Descriptors / Indicators: Operative site guarding, Sore Pain Intervention(s): Limited activity within patient's tolerance, Premedicated before session, Repositioned    Home Living Family/patient expects to be discharged to:: Private residence Living Arrangements: Non-relatives/Friends (Roommate who works as a Water engineer) Available Help at Discharge: Friend(s);Neighbor;Available PRN/intermittently Type of Home: House Home Access: Stairs to enter Entrance Stairs-Rails: None Entrance Stairs-Number of Steps: 1+1   Home Layout: One level Home Equipment: Hand held shower head;Tub bench;Rolling Walker (2 wheels) Additional Comments: pt has a dog named Happi that is a labadoodle 72 yo    Prior Function Prior Level of Function : Independent/Modified Independent;Driving             Mobility Comments: Using walker for longer walks and to walk out to let dog out in the yard. ADLs Comments: Using tub bench     Extremity/Trunk Assessment   Upper Extremity Assessment Upper Extremity Assessment: Defer to OT evaluation    Lower Extremity Assessment Lower Extremity Assessment: Generalized weakness;LLE deficits/detail LLE Deficits / Details: Pt reports LLE feels weaker however with functional mobility no knee buckling or antalgic gait.    Cervical / Trunk Assessment Cervical / Trunk Assessment: Back Surgery  Communication   Communication Communication: No apparent difficulties    Cognition Arousal: Alert Behavior During Therapy: Anxious, Restless   PT - Cognitive impairments: No apparent impairments                         Following commands: Intact       Cueing Cueing Techniques: Verbal cues, Gestural cues     General Comments General comments (skin integrity, edema, etc.): incision dry and intact at this time. Dressing was changed  within the last hour per RN and pt    Exercises     Assessment/Plan    PT Assessment Patient needs continued PT services  PT Problem List Decreased strength;Decreased activity tolerance;Decreased balance;Decreased mobility;Decreased knowledge of use of DME;Decreased safety awareness;Decreased knowledge of precautions;Pain       PT Treatment Interventions DME instruction;Gait training;Stair training;Functional mobility training;Therapeutic activities;Therapeutic exercise;Balance training;Patient/family education    PT Goals (Current goals can be found in the Care Plan section)  Acute Rehab PT Goals Patient Stated Goal: Home tomorrow PT Goal Formulation: With patient Time For Goal Achievement: 03/10/24 Potential to Achieve Goals: Good    Frequency Min 5X/week     Co-evaluation               AM-PAC PT "6 Clicks" Mobility  Outcome Measure Help needed turning from your back to your side while in a flat bed without using bedrails?: None Help needed moving from lying on your back to sitting on the side of a flat bed without using bedrails?: None Help needed moving to and from a bed to a chair (including a wheelchair)?: A Little Help needed standing up from a chair using your arms (e.g., wheelchair or bedside chair)?: None Help needed to walk in hospital room?: A Little Help needed climbing 3-5 steps with  a railing? : A Little 6 Click Score: 21    End of Session Equipment Utilized During Treatment: Gait belt;Back brace Activity Tolerance: Patient tolerated treatment well Patient left: in bed;with call bell/phone within reach Nurse Communication: Mobility status PT Visit Diagnosis: Unsteadiness on feet (R26.81);Pain Pain - part of body:  (back)    Time: 4098-1191 PT Time Calculation (min) (ACUTE ONLY): 34 min   Charges:   PT Evaluation $PT Eval Low Complexity: 1 Low PT Treatments $Gait Training: 8-22 mins PT General Charges $$ ACUTE PT VISIT: 1 Visit          Conni Slipper, PT, DPT Acute Rehabilitation Services Secure Chat Preferred Office: [phone]   Marylynn Pearson 03/03/2024, 1:25 PM

## 2024-03-03 NOTE — Discharge Instructions (Signed)

## 2024-03-03 NOTE — Care Management Obs Status (Signed)
 MEDICARE OBSERVATION STATUS NOTIFICATION   Patient Details  Name: Carol Duncan MRN: [id number] Date of Birth: 10-Jan-1951   Medicare Observation Status Notification Given:  Yes    Carol Ide [name], RN 03/03/2024, 12:09 PM

## 2024-03-03 NOTE — Discharge Summary (Signed)
 Physician Discharge Summary  Patient ID: Carol Duncan MRN: [id number] DOB/AGE: October 01, 1951 73 y.o.  Admit date: 03/02/2024 Discharge date: 03/03/2024  Admission Diagnoses:  Discharge Diagnoses:  Principal Problem:   Sacral fracture [facility])   Discharged Condition: good  Hospital Course: Patient mated to hospital where she underwent uncomplicated extension of her lumbosacral fusion with addition of pelvic fixation for treatment of her sacral fracture.  Postoperatively doing well.  Standing ambulating and voiding without difficulty.  Preoperative radicular symptoms and back pain improved.  Ready for discharge home.  Consults:   Significant Diagnostic Studies:   Treatments:   Discharge Exam: Blood pressure (!) 119/55, pulse 69, temperature 99.1 F (37.3 C), temperature source Oral, resp. rate 18, height 5\' 1"  (1.549 m), weight 77.1 kg, SpO2 96%. Awake and alert.  Oriented and appropriate.  Motor and sensory intact.  Wound with some mild bloody drainage on the bandage but otherwise intact.  Chest and abdomen benign.  Disposition: Discharge disposition: 01-Home or Self Care        Allergies as of 03/03/2024       Reactions   Alpha-gal Anaphylaxis   Shellfish Allergy    Shrimp ok, Scallops, clams, oysters give angioedema Other Reaction(s): Not available   Beef-derived Drug Products    Lambs Quarters    Pork-derived Products         Medication List     TAKE these medications    Adderall 20 MG tablet Generic drug: amphetamine-dextroamphetamine Take 10 mg by mouth daily.   ALPRAZolam 0.5 MG tablet Commonly known as: XANAX Take one tablet four times daily.   Biotin 10 MG Tabs Take 10 mg by mouth daily.   EPINEPHrine 0.3 mg/0.3 mL Soaj injection Commonly known as: EPI-PEN Inject 0.3 mg into the muscle as needed for anaphylaxis. Call EMS if you use this.   estradiol 1 MG tablet Commonly known as: ESTRACE Take 1 mg by mouth daily.   ferrous sulfate  325 (65 FE) MG tablet Take 325 mg by mouth daily.   HYDROcodone-acetaminophen 10-325 MG tablet Commonly known as: NORCO Take 1 tablet by mouth every 4 (four) hours as needed for severe pain (pain score 7-10) (takes one-half tablet).   lamoTRIgine 150 MG tablet Commonly known as: LAMICTAL Take 1 tablet (150 mg total) by mouth 2 (two) times daily.   levothyroxine 50 MCG tablet Commonly known as: SYNTHROID Take 50 mcg by mouth daily before breakfast.   methocarbamol 500 MG tablet Commonly known as: ROBAXIN Take 1 tablet (500 mg total) by mouth every 6 (six) hours as needed for muscle spasms.   naproxen 500 MG tablet Commonly known as: NAPROSYN Take 500 mg by mouth 2 (two) times daily as needed for mild pain.   Oxycodone HCl 10 MG Tabs Take 1 tablet (10 mg total) by mouth every 3 (three) hours as needed for severe pain (pain score 7-10).   oxyCODONE-acetaminophen 5-325 MG tablet Commonly known as: PERCOCET/ROXICET Take 1 tablet by mouth in the morning, at noon, in the evening, and at bedtime.   pregabalin 150 MG capsule Commonly known as: LYRICA Take 150 mg by mouth 2 (two) times daily.   QUEtiapine Fumarate 150 MG Tabs Take one tablet at bedtime.   tiZANidine 4 MG tablet Commonly known as: ZANAFLEX Take 1 tablet (4 mg total) by mouth every 6 (six) hours as needed for muscle spasms. What changed: when to take this   TURMERIC CURCUMIN PO Take 1 capsule by mouth daily.   valACYclovir 500  MG tablet Commonly known as: VALTREX Take 500 mg by mouth daily.   valsartan-hydrochlorothiazide 80-12.5 MG tablet Commonly known as: DIOVAN-HCT Take 1 tablet by mouth daily.   Vitamin D3 125 MCG (5000 UT) Tabs Take 5,000 Units by mouth daily.               Durable Medical Equipment  (From admission, onward)           Start     Ordered   03/02/24 1709  DME Walker rolling  Once       Question:  Patient needs a walker to treat with the following condition  Answer:   Sacral fracture (HCC)   03/02/24 1708   03/02/24 1709  DME 3 n 1  Once        03/02/24 1708             Signed: [name] Wei Poplaski 03/03/2024, 9:45 AM

## 2024-03-04 DIAGNOSIS — M960 Pseudarthrosis after fusion or arthrodesis: Secondary | ICD-10-CM | POA: Diagnosis not present

## 2024-03-04 DIAGNOSIS — M549 Dorsalgia, unspecified: Secondary | ICD-10-CM | POA: Diagnosis not present

## 2024-03-04 DIAGNOSIS — S3210XA Unspecified fracture of sacrum, initial encounter for closed fracture: Secondary | ICD-10-CM | POA: Diagnosis not present

## 2024-03-04 DIAGNOSIS — E039 Hypothyroidism, unspecified: Secondary | ICD-10-CM | POA: Diagnosis not present

## 2024-03-04 DIAGNOSIS — I10 Essential (primary) hypertension: Secondary | ICD-10-CM | POA: Diagnosis not present

## 2024-03-04 DIAGNOSIS — Z87891 Personal history of nicotine dependence: Secondary | ICD-10-CM | POA: Diagnosis not present

## 2024-03-04 NOTE — Progress Notes (Signed)
 Patient alert and oriented, void, ambulate. Surgical site clean and dry no sign infection. D/c instructions explain and given all questions answered.

## 2024-03-04 NOTE — Progress Notes (Signed)
 Physical Therapy Treatment Patient Details Name: Carol Duncan MRN: [id number] DOB: [birth year] Today's Date: 03/04/2024   History of Present Illness Pt is a 73 y/o female who presents 03/02/2024 for lumbar surgery. Pt had a recent lumbar fusion in 10/2023 complicated by post-op fall at home, sustaining a sacral fracture. She is now s/p extension of lumbosacral fusion with pelvic fixation L4-S1. PMH significant for IR sacroplasty 11/27/2023, lumbar fusion 2001, 2004, ADHD, diverticulitis, fibromyalgia, HA, HTN, hypothyroidism.    PT Comments  Pt dressed and sitting on EoB, increased pain and desire to go home. Pt agreeable to trial RW for more support with her ambulation. Pt mod I for transfers and contact guard for ambulation in hallway. Pt refuses stair training as she states she does not have 2 steps but 2 low thresholds. Pt only agreeable to to marching in place with foot height approximating threshold height. Pt is able to recall 3/3 back precautions. Pt ready to go.    If plan is discharge home, recommend the following: A little help with walking and/or transfers;A little help with bathing/dressing/bathroom;Assistance with cooking/housework;Help with stairs or ramp for entrance;Assist for transportation   Can travel by private vehicle      Yes   Equipment Recommendations  None recommended by PT       Precautions / Restrictions Precautions Precautions: Fall;Back Precaution Booklet Issued: Yes (comment) Recall of Precautions/Restrictions: Intact Precaution/Restrictions Comments: Reviewed handout and pt was cued for precautions during functional mobility. Required Braces or Orthoses: Spinal Brace Spinal Brace: Lumbar corset;Applied in standing position (Per pt request) Restrictions Weight Bearing Restrictions Per Provider Order: No     Mobility  Bed Mobility                    Transfers Overall transfer level: Modified independent Equipment used: Rolling walker  (2 wheels)               General transfer comment: good power up benefits    Ambulation/Gait Ambulation/Gait assistance: Contact guard assist Gait Distance (Feet): 50 Feet Assistive device: None, Rolling walker (2 wheels) Gait Pattern/deviations: Step-through pattern, Decreased stride length Gait velocity: Decreased     General Gait Details: pt frustrated with pain and wanting to discharge, agreeable to using   Stairs         General stair comments: Pt reports she has 2 thresholds, only agreeable to marching in place to approximate threshold height          Balance Overall balance assessment: Mild deficits observed, not formally tested                                          Communication Communication Communication: No apparent difficulties  Cognition Arousal: Alert Behavior During Therapy: Anxious, Restless   PT - Cognitive impairments: No apparent impairments                         Following commands: Intact      Cueing Cueing Techniques: Verbal cues, Gestural cues     General Comments General comments (skin integrity, edema, etc.): pt dressed, husband in room, impatient about going home      Pertinent Vitals/Pain Pain Assessment Pain Assessment: Faces Faces Pain Scale: Hurts a little bit Pain Location: Incision site. Pt reports high pain, however faces pain scale 2/10 Pain Descriptors / Indicators: Operative site guarding,  Sore Pain Intervention(s): Limited activity within patient's tolerance, Monitored during session, Repositioned     PT Goals (current goals can now be found in the care plan section) Acute Rehab PT Goals Patient Stated Goal: Home tomorrow PT Goal Formulation: With patient Time For Goal Achievement: 03/10/24 Potential to Achieve Goals: Good Progress towards PT goals: Progressing toward goals    Frequency    Min 5X/week       AM-PAC PT "6 Clicks" Mobility   Outcome Measure  Help  needed turning from your back to your side while in a flat bed without using bedrails?: None Help needed moving from lying on your back to sitting on the side of a flat bed without using bedrails?: None Help needed moving to and from a bed to a chair (including a wheelchair)?: A Little Help needed standing up from a chair using your arms (e.g., wheelchair or bedside chair)?: None Help needed to walk in hospital room?: A Little Help needed climbing 3-5 steps with a railing? : A Little 6 Click Score: 21    End of Session Equipment Utilized During Treatment: Gait belt;Back brace Activity Tolerance: Patient tolerated treatment well Patient left: in bed;with call bell/phone within reach Nurse Communication: Mobility status PT Visit Diagnosis: Unsteadiness on feet (R26.81);Pain Pain - part of body:  (back)     Time: 1000-1013 PT Time Calculation (min) (ACUTE ONLY): 13 min  Charges:    $Gait Training: 8-22 mins PT General Charges $$ ACUTE PT VISIT: 1 Visit                     Kathline Banbury B. Beverely Risen PT, DPT Acute Rehabilitation Services Please use secure chat or  Call Office [phone]    Elon Alas Fleet 03/04/2024, 11:15 AM

## 2024-04-02 DIAGNOSIS — S32009K Unspecified fracture of unspecified lumbar vertebra, subsequent encounter for fracture with nonunion: Secondary | ICD-10-CM | POA: Diagnosis not present

## 2024-04-02 DIAGNOSIS — Z6832 Body mass index (BMI) 32.0-32.9, adult: Secondary | ICD-10-CM | POA: Diagnosis not present

## 2024-04-09 DIAGNOSIS — I10 Essential (primary) hypertension: Secondary | ICD-10-CM | POA: Diagnosis not present

## 2024-04-09 DIAGNOSIS — E039 Hypothyroidism, unspecified: Secondary | ICD-10-CM | POA: Diagnosis not present

## 2024-04-09 DIAGNOSIS — R635 Abnormal weight gain: Secondary | ICD-10-CM | POA: Diagnosis not present

## 2024-04-09 DIAGNOSIS — Z981 Arthrodesis status: Secondary | ICD-10-CM | POA: Diagnosis not present

## 2024-04-09 DIAGNOSIS — G8929 Other chronic pain: Secondary | ICD-10-CM | POA: Diagnosis not present

## 2024-05-07 DIAGNOSIS — Z6834 Body mass index (BMI) 34.0-34.9, adult: Secondary | ICD-10-CM | POA: Diagnosis not present

## 2024-05-07 DIAGNOSIS — S32009K Unspecified fracture of unspecified lumbar vertebra, subsequent encounter for fracture with nonunion: Secondary | ICD-10-CM | POA: Diagnosis not present

## 2024-05-20 ENCOUNTER — Telehealth: Admitting: Adult Health

## 2024-05-20 ENCOUNTER — Encounter: Payer: Self-pay | Admitting: Adult Health

## 2024-05-20 DIAGNOSIS — F32A Depression, unspecified: Secondary | ICD-10-CM

## 2024-05-20 DIAGNOSIS — F419 Anxiety disorder, unspecified: Secondary | ICD-10-CM

## 2024-05-20 DIAGNOSIS — F411 Generalized anxiety disorder: Secondary | ICD-10-CM

## 2024-05-20 DIAGNOSIS — F331 Major depressive disorder, recurrent, moderate: Secondary | ICD-10-CM

## 2024-05-20 MED ORDER — ALPRAZOLAM 0.5 MG PO TABS: ORAL_TABLET | ORAL | 2 refills | Status: DC

## 2024-05-20 NOTE — Progress Notes (Signed)
 Carol Duncan [id number] November 09, 1951 73 y.o.  Virtual Visit via Video Note  I connected with pt @ on 05/20/24 at  2:00 PM EDT by a video enabled telemedicine application and verified that I am speaking with the correct person using two identifiers.   I discussed the limitations of evaluation and management by telemedicine and the availability of in person appointments. The patient expressed understanding and agreed to proceed.  I discussed the assessment and treatment plan with the patient. The patient was provided an opportunity to ask questions and all were answered. The patient agreed with the plan and demonstrated an understanding of the instructions.   The patient was advised to call back or seek an in-person evaluation if the symptoms worsen or if the condition fails to improve as anticipated.  I provided 10 minutes of non-face-to-face time during this encounter.  The patient was located at home.  The provider was located at [facility].   Reagan Camera, NP   Subjective:   Patient ID:  Carol Duncan is a 73 y.o. (DOB Apr 21, 1951) female.  Chief Complaint: No chief complaint on file.   HPI Carol Duncan presents for follow-up of MDD and GAD.  Describes mood today as "ok". Pleasant. Denies tearfulness. Mood symptoms - reports depression and anxiety. Reports varying interest and motivation - "not at all to barely". Reports situational irritability. Denies panic attacks. Reports some worry, rumination and over thinking. Reports obsessive thoughts, not acts. Reports ongoing situational stressors. Reports mood is lower. Stating "I'm not doing too good". Taking medications as prescribed.  Energy levels lower - "pain and muscles". Active, does not have a regular exercise routine with physical limitations. Enjoys some usual interests and activities. Divorced. Lives alone with dog. Spending time with family. Appetite adequate. Weight gain - 170 to  180 pounds.  Sleeping better some nights than others. Averages 6 to 7 hours. Reports decreased daytime napping. Reports focus and concentration difficulties - taking Adderall daily. Completing minimal tasks. Managing aspects of household. Retired. Denies SI or HI.  Denies AH or VH. Denies self harm. Denies substance use.   Therapist - Casandra Claw  Previous medication trials: Wellbutrin, Lithium ,others   Review of Systems:  Review of Systems  Musculoskeletal:  Negative for gait problem.  Neurological:  Negative for tremors.  Psychiatric/Behavioral:         Please refer to HPI    Medications: I have reviewed the patient's current medications.  Current Outpatient Medications  Medication Sig Dispense Refill   ADDERALL 20 MG tablet Take 10 mg by mouth daily.     ALPRAZolam  (XANAX ) 0.5 MG tablet Take one tablet four times daily. 120 tablet 2   Biotin  10 MG TABS Take 10 mg by mouth daily.     Cholecalciferol  (VITAMIN D3) 125 MCG (5000 UT) TABS Take 5,000 Units by mouth daily.     EPINEPHrine  0.3 mg/0.3 mL IJ SOAJ injection Inject 0.3 mg into the muscle as needed for anaphylaxis. Call EMS if you use this. 2 each 1   estradiol  (ESTRACE ) 1 MG tablet Take 1 mg by mouth daily.     ferrous sulfate  325 (65 FE) MG tablet Take 325 mg by mouth daily.     HYDROcodone -acetaminophen  (NORCO) 10-325 MG tablet Take 1 tablet by mouth every 4 (four) hours as needed for severe pain (pain score 7-10) (takes one-half tablet). (Patient not taking: Reported on 02/21/2024) 40 tablet 0   lamoTRIgine  (LAMICTAL ) 150 MG tablet Take 1 tablet (150 mg  total) by mouth 2 (two) times daily. 60 tablet 5   levothyroxine  (SYNTHROID ) 50 MCG tablet Take 50 mcg by mouth daily before breakfast.     methocarbamol  (ROBAXIN ) 500 MG tablet Take 1 tablet (500 mg total) by mouth every 6 (six) hours as needed for muscle spasms. 40 tablet 1   naproxen  (NAPROSYN ) 500 MG tablet Take 500 mg by mouth 2 (two) times daily as needed  for mild pain.     oxyCODONE  10 MG TABS Take 1 tablet (10 mg total) by mouth every 3 (three) hours as needed for severe pain (pain score 7-10). 30 tablet 0   oxyCODONE -acetaminophen  (PERCOCET/ROXICET) 5-325 MG tablet Take 1 tablet by mouth in the morning, at noon, in the evening, and at bedtime.     pregabalin  (LYRICA ) 150 MG capsule Take 150 mg by mouth 2 (two) times daily.     QUEtiapine  150 MG TABS Take one tablet at bedtime. 30 tablet 5   tiZANidine  (ZANAFLEX ) 4 MG tablet Take 1 tablet (4 mg total) by mouth every 6 (six) hours as needed for muscle spasms. (Patient taking differently: Take 4 mg by mouth in the morning and at bedtime.) 30 tablet 0   TURMERIC CURCUMIN PO Take 1 capsule by mouth daily.     valACYclovir  (VALTREX ) 500 MG tablet Take 500 mg by mouth daily.     valsartan -hydrochlorothiazide  (DIOVAN -HCT) 80-12.5 MG tablet Take 1 tablet by mouth daily.     No current facility-administered medications for this visit.    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   Anxiety    Arthritis    Bile duct abnormality    Complication of anesthesia    Takes alot to keep patient under anesthesia   Depression    Diverticulitis    Fibromyalgia    Headache    Sinus Headaches   History of kidney stones    Hypertension    Hypothyroidism     Family History  Problem Relation Age of Onset   Brain cancer Mother    Heart attack Father     Social History   Socioeconomic History   Marital status: Single    Spouse name: Not on file   Number of children: Not on file   Years of education: Not on file    Highest education level: Not on file  Occupational History   Not on file  Tobacco Use   Smoking status: Former    Current packs/day: 0.50    Types: Cigarettes   Smokeless tobacco: Never   Tobacco comments:    Smoked for 10 years from [age] years old  Vaping Use   Vaping status: Never Used  Substance and Sexual Activity   Alcohol use: Not Currently    Comment: very rare   Drug use: No   Sexual activity: Yes  Other Topics Concern   Not on file  Social History Narrative   Not on file   Social Drivers of Health   Financial Resource Strain: Not on file  Food Insecurity: Not on file  Transportation Needs: Not on file  Physical Activity: Not on file  Stress: Not on file  Social Connections: Not on file  Intimate Partner Violence: Not on file    Past Medical History, Surgical history, Social history, and Family history were reviewed and updated as appropriate.   Please see review of systems for further details on the patient's review from today.   Objective:   Physical Exam:  There were no vitals taken for this visit.  Physical Exam Constitutional:      General: She is not in acute distress. Musculoskeletal:        General: No deformity.  Neurological:     Mental Status: She is alert and oriented to person, place, and time.     Coordination: Coordination normal.  Psychiatric:        Attention and Perception: Attention and perception normal. She does not perceive auditory or visual hallucinations.        Mood and Affect: Mood normal. Mood is not anxious or depressed. Affect is not labile, blunt, angry or inappropriate.        Speech: Speech normal.        Behavior: Behavior normal.        Thought Content: Thought content normal. Thought content is not paranoid or delusional. Thought content does not include homicidal or suicidal ideation. Thought content does not include homicidal or suicidal plan.        Cognition and Memory: Cognition and memory normal.         Judgment: Judgment normal.     Comments: Insight intact     Lab Review:     Component Value Date/Time   NA 141 02/11/2024 1449   K 5.2 02/11/2024 1449   CL 99 02/11/2024 1449   CO2 27 02/11/2024 1449   GLUCOSE 81 02/11/2024 1449   GLUCOSE 107 (H) 11/27/2023 0738   BUN 19 02/11/2024 1449   CREATININE 0.97 02/11/2024 1449   CALCIUM 11.0 (H) 02/11/2024 1449   PROT 7.7 02/11/2024 1449   ALBUMIN 4.7 02/11/2024 1449   AST 19 02/11/2024 1449   ALT 11 02/11/2024 1449   ALKPHOS 94 02/11/2024 1449   BILITOT <0.2 02/11/2024 1449   GFRNONAA 33 (L) 11/27/2023 0738   GFRAA >60 12/21/2017 0923       Component Value Date/Time   WBC 9.1 02/11/2024 1449   WBC 7.9 11/27/2023 0738   RBC 4.51 02/11/2024 1449   RBC 3.86 (L) 11/27/2023 0738   HGB 14.7 02/11/2024 1449   HCT 43.9 02/11/2024 1449   PLT 285 02/11/2024 1449   MCV 97 02/11/2024 1449   MCH 32.6 02/11/2024 1449   MCH 31.9 11/27/2023 0738   MCHC 33.5 02/11/2024 1449   MCHC 34.2 11/27/2023 0738   RDW 12.5 02/11/2024 1449   LYMPHSABS 2.2 02/11/2024 1449   MONOABS 0.4 09/24/2015 0858   EOSABS 0.1 02/11/2024 1449   BASOSABS 0.1 02/11/2024 1449    Lithium  Lvl  Date Value Ref Range Status  02/26/2016 <0.06 (L) 0.60 - 1.20 mmol/L Final     No results found for: "PHENYTOIN", "PHENOBARB", "VALPROATE", "CBMZ"   .res Assessment: Plan:    Plan:  PDMP reviewed  Seroquel  100mg  at bedtime for sleep  Lamictal  150mg  BID  Xanax  1mg  twice daily.  RTC 3 months  10 minutes spent dedicated to the care of this patient on the date of this encounter to include pre-visit review of records, ordering of medication, post visit documentation, and face-to-face time with the patient discussing depression and anxiety. Discussed continuing  current medication regimen.  Patient advised to contact office with any questions, adverse effects, or acute worsening in signs and symptoms.   Discussed potential benefits, risk, and side effects of  benzodiazepines to include potential risk of tolerance and dependence, as well as possible drowsiness. Advised patient not to drive if experiencing drowsiness and to take lowest possible effective dose to minimize risk of dependence and tolerance.   Counseled patient regarding potential benefits, risks, and side effects of Lamictal  to include potential risk of Stevens-Johnson syndrome. Advised patient to stop taking Lamictal  and contact office immediately if rash develops and to seek urgent medical attention if rash is severe and/or spreading quickly.   There are no diagnoses linked to this encounter.   Please see After Visit Summary for patient specific instructions.  Future Appointments  Date Time Provider Department Center  05/20/2024  2:00 PM Treanna Dumler Nattalie, NP [facility] None    No orders of the defined types were placed in this encounter.     -------------------------------

## 2024-07-23 DIAGNOSIS — S32009K Unspecified fracture of unspecified lumbar vertebra, subsequent encounter for fracture with nonunion: Secondary | ICD-10-CM | POA: Diagnosis not present

## 2024-08-04 DIAGNOSIS — R2689 Other abnormalities of gait and mobility: Secondary | ICD-10-CM | POA: Diagnosis not present

## 2024-08-04 DIAGNOSIS — M545 Low back pain, unspecified: Secondary | ICD-10-CM | POA: Diagnosis not present

## 2024-08-09 ENCOUNTER — Other Ambulatory Visit: Payer: Self-pay | Admitting: Adult Health

## 2024-08-09 DIAGNOSIS — F331 Major depressive disorder, recurrent, moderate: Secondary | ICD-10-CM

## 2024-08-09 NOTE — Telephone Encounter (Signed)
 What dose is she taking? Note says 150 mg.

## 2024-08-10 NOTE — Telephone Encounter (Signed)
LVM to [facility] to verify dose

## 2024-08-11 DIAGNOSIS — R2689 Other abnormalities of gait and mobility: Secondary | ICD-10-CM | POA: Diagnosis not present

## 2024-08-11 DIAGNOSIS — M545 Low back pain, unspecified: Secondary | ICD-10-CM | POA: Diagnosis not present

## 2024-08-13 DIAGNOSIS — Z780 Asymptomatic menopausal state: Secondary | ICD-10-CM | POA: Diagnosis not present

## 2024-08-13 DIAGNOSIS — G8929 Other chronic pain: Secondary | ICD-10-CM | POA: Diagnosis not present

## 2024-08-13 DIAGNOSIS — R635 Abnormal weight gain: Secondary | ICD-10-CM | POA: Diagnosis not present

## 2024-08-13 DIAGNOSIS — I10 Essential (primary) hypertension: Secondary | ICD-10-CM | POA: Diagnosis not present

## 2024-08-13 DIAGNOSIS — F909 Attention-deficit hyperactivity disorder, unspecified type: Secondary | ICD-10-CM | POA: Diagnosis not present

## 2024-08-13 DIAGNOSIS — Z981 Arthrodesis status: Secondary | ICD-10-CM | POA: Diagnosis not present

## 2024-08-13 DIAGNOSIS — Z8781 Personal history of (healed) traumatic fracture: Secondary | ICD-10-CM | POA: Diagnosis not present

## 2024-08-13 DIAGNOSIS — Z1211 Encounter for screening for malignant neoplasm of colon: Secondary | ICD-10-CM | POA: Diagnosis not present

## 2024-08-19 ENCOUNTER — Encounter: Payer: Self-pay | Admitting: Adult Health

## 2024-08-19 ENCOUNTER — Telehealth: Admitting: Adult Health

## 2024-08-19 DIAGNOSIS — F411 Generalized anxiety disorder: Secondary | ICD-10-CM

## 2024-08-19 DIAGNOSIS — F331 Major depressive disorder, recurrent, moderate: Secondary | ICD-10-CM

## 2024-08-19 MED ORDER — LAMOTRIGINE 150 MG PO TABS: 150.0000 mg | ORAL_TABLET | Freq: Two times a day (BID) | ORAL | 2 refills | Status: DC

## 2024-08-19 MED ORDER — ALPRAZOLAM 0.5 MG PO TABS: ORAL_TABLET | ORAL | 2 refills | Status: DC

## 2024-08-19 MED ORDER — QUETIAPINE FUMARATE 150 MG PO TABS: 1.0000 | ORAL_TABLET | Freq: Every day | ORAL | 2 refills | Status: DC

## 2024-08-19 NOTE — Progress Notes (Signed)
 Carol Duncan [id number] 11-10-1951 73 y.o.  Virtual Visit via Video Note  I connected with pt @ on 08/19/24 at  2:00 PM EDT by a video enabled telemedicine application and verified that I am speaking with the correct person using two identifiers.   I discussed the limitations of evaluation and management by telemedicine and the availability of in person appointments. The patient expressed understanding and agreed to proceed.  I discussed the assessment and treatment plan with the patient. The patient was provided an opportunity to ask questions and all were answered. The patient agreed with the plan and demonstrated an understanding of the instructions.   The patient was advised to call back or seek an in-person evaluation if the symptoms worsen or if the condition fails to improve as anticipated.  I provided 20 minutes of non-face-to-face time during this encounter.  The patient was located at home.  The provider was located at [facility].   Angeline LOISE Sayers, NP   Subjective:   Patient ID:  Carol Duncan is a 73 y.o. (DOB 1951-02-07) female.  Chief Complaint: No chief complaint on file.   HPI Mickey Hebel presents for follow-up of MDD and GAD.  Describes mood today as ok. Pleasant. Denies tearfulness. Mood symptoms - reports depression and anxiety. Reports varying interest and decreased motivation - impaired by anxiety and pain. Reports situational irritability. Denies panic attacks. Reports some worry, rumination and over thinking. Reports obsessive thoughts, not acts. Reports ongoing situational stressors. Reports mood is lower. Stating I don't feel like I'm doing too good. Reports pain prevents her from doing more - planning to meet with pain management. Taking medications as prescribed.  Energy levels lower. Active, does not have a regular exercise routine with physical limitations. Enjoys some usual interests and activities.  Divorced. Lives alone with dog. Considering a new roommate. Spending time with family. Appetite adequate. Weight stable - 180 pounds.  Sleeping better some nights than others. Averages 8 hours. Reports some daytime napping - 30 to 45 minutes. Reports focus and concentration difficulties - it's not great. Completing minimal tasks. Managing aspects of household. Retired. Denies SI or HI.  Denies AH or VH. Denies self harm. Denies substance use.   Therapist - Jennifer Seals  Previous medication trials: Wellbutrin, Lithium ,others     Review of Systems:  Review of Systems  Musculoskeletal:  Negative for gait problem.  Neurological:  Negative for tremors.  Psychiatric/Behavioral:         Please refer to HPI    Medications: I have reviewed the patient's current medications.  Current Outpatient Medications  Medication Sig Dispense Refill   ALPRAZolam  (XANAX ) 0.5 MG tablet Take one tablet four times daily. 120 tablet 2   Biotin  10 MG TABS Take 10 mg by mouth daily.     Cholecalciferol  (VITAMIN D3) 125 MCG (5000 UT) TABS Take 5,000 Units by mouth daily.     EPINEPHrine  0.3 mg/0.3 mL IJ SOAJ injection Inject 0.3 mg into the muscle as needed for anaphylaxis. Call EMS if you use this. 2 each 1   estradiol  (ESTRACE ) 1 MG tablet Take 1 mg by mouth daily.     ferrous sulfate  325 (65 FE) MG tablet Take 325 mg by mouth daily.     HYDROcodone -acetaminophen  (NORCO) 10-325 MG tablet Take 1 tablet by mouth every 4 (four) hours as needed for severe pain (pain score 7-10) (takes one-half tablet). (Patient not taking: Reported on 02/21/2024) 40 tablet 0   lamoTRIgine  (LAMICTAL ) 150  MG tablet Take 1 tablet (150 mg total) by mouth 2 (two) times daily. 60 tablet 2   levothyroxine  (SYNTHROID ) 50 MCG tablet Take 50 mcg by mouth daily before breakfast.     methocarbamol  (ROBAXIN ) 500 MG tablet Take 1 tablet (500 mg total) by mouth every 6 (six) hours as needed for muscle spasms. 40 tablet 1   naproxen   (NAPROSYN ) 500 MG tablet Take 500 mg by mouth 2 (two) times daily as needed for mild pain.     oxyCODONE  10 MG TABS Take 1 tablet (10 mg total) by mouth every 3 (three) hours as needed for severe pain (pain score 7-10). 30 tablet 0   oxyCODONE -acetaminophen  (PERCOCET/ROXICET) 5-325 MG tablet Take 1 tablet by mouth in the morning, at noon, in the evening, and at bedtime.     pregabalin  (LYRICA ) 150 MG capsule Take 150 mg by mouth 2 (two) times daily.     QUEtiapine  Fumarate 150 MG TABS Take 1 tablet by mouth at bedtime. 30 tablet 2   tiZANidine  (ZANAFLEX ) 4 MG tablet Take 1 tablet (4 mg total) by mouth every 6 (six) hours as needed for muscle spasms. (Patient taking differently: Take 4 mg by mouth in the morning and at bedtime.) 30 tablet 0   TURMERIC CURCUMIN PO Take 1 capsule by mouth daily.     valACYclovir  (VALTREX ) 500 MG tablet Take 500 mg by mouth daily.     valsartan -hydrochlorothiazide  (DIOVAN -HCT) 80-12.5 MG tablet Take 1 tablet by mouth daily.     No current facility-administered medications for this visit.    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   Anxiety    Arthritis    Bile duct abnormality    Complication of anesthesia    Takes alot to keep patient under anesthesia   Depression    Diverticulitis    Fibromyalgia    Headache    Sinus Headaches   History of kidney stones    Hypertension    Hypothyroidism     Family History  Problem Relation Age of Onset   Brain cancer Mother    Heart attack Father     Social History   Socioeconomic History   Marital status: Single    Spouse  name: Not on file   Number of children: Not on file   Years of education: Not on file   Highest education level: Not on file  Occupational History   Not on file  Tobacco Use   Smoking status: Former    Current packs/day: 0.50    Types: Cigarettes   Smokeless tobacco: Never   Tobacco comments:    Smoked for 10 years from [age] years old  Vaping Use   Vaping status: Never Used  Substance and Sexual Activity   Alcohol use: Not Currently    Comment: very rare   Drug use: No   Sexual activity: Yes  Other Topics Concern   Not on file  Social History Narrative   Not on file   Social Drivers of Health   Financial Resource Strain: Not on file  Food  Insecurity: Not on file  Transportation Needs: Not on file  Physical Activity: Not on file  Stress: Not on file  Social Connections: Not on file  Intimate Partner Violence: Not on file    Past Medical History, Surgical history, Social history, and Family history were reviewed and updated as appropriate.   Please see review of systems for further details on the patient's review from today.   Objective:   Physical Exam:  There were no vitals taken for this visit.  Physical Exam Constitutional:      General: She is not in acute distress. Musculoskeletal:        General: No deformity.  Neurological:     Mental Status: She is alert and oriented to person, place, and time.     Coordination: Coordination normal.  Psychiatric:        Attention and Perception: Attention and perception normal. She does not perceive auditory or visual hallucinations.        Mood and Affect: Mood normal. Mood is not anxious or depressed. Affect is not labile, blunt, angry or inappropriate.        Speech: Speech normal.        Behavior: Behavior normal.        Thought Content: Thought content normal. Thought content is not paranoid or delusional. Thought content does not include homicidal or suicidal ideation. Thought content does not include homicidal  or suicidal plan.        Cognition and Memory: Cognition and memory normal.        Judgment: Judgment normal.     Comments: Insight intact     Lab Review:     Component Value Date/Time   NA 141 02/11/2024 1449   K 5.2 02/11/2024 1449   CL 99 02/11/2024 1449   CO2 27 02/11/2024 1449   GLUCOSE 81 02/11/2024 1449   GLUCOSE 107 (H) 11/27/2023 0738   BUN 19 02/11/2024 1449   CREATININE 0.97 02/11/2024 1449   CALCIUM 11.0 (H) 02/11/2024 1449   PROT 7.7 02/11/2024 1449   ALBUMIN 4.7 02/11/2024 1449   AST 19 02/11/2024 1449   ALT 11 02/11/2024 1449   ALKPHOS 94 02/11/2024 1449   BILITOT <0.2 02/11/2024 1449   GFRNONAA 33 (L) 11/27/2023 0738   GFRAA >60 12/21/2017 0923       Component Value Date/Time   WBC 9.1 02/11/2024 1449   WBC 7.9 11/27/2023 0738   RBC 4.51 02/11/2024 1449   RBC 3.86 (L) 11/27/2023 0738   HGB 14.7 02/11/2024 1449   HCT 43.9 02/11/2024 1449   PLT 285 02/11/2024 1449   MCV 97 02/11/2024 1449   MCH 32.6 02/11/2024 1449   MCH 31.9 11/27/2023 0738   MCHC 33.5 02/11/2024 1449   MCHC 34.2 11/27/2023 0738   RDW 12.5 02/11/2024 1449   LYMPHSABS 2.2 02/11/2024 1449   MONOABS 0.4 09/24/2015 0858   EOSABS 0.1 02/11/2024 1449   BASOSABS 0.1 02/11/2024 1449    Lithium  Lvl  Date Value Ref Range Status  02/26/2016 <0.06 (L) 0.60 - 1.20 mmol/L Final     No results found for: PHENYTOIN, PHENOBARB, VALPROATE, CBMZ   .res Assessment: Plan:    Plan:  PDMP reviewed  Seroquel  150mg  at bedtime for sleep  Lamictal  150mg  BID  Xanax  1mg  twice daily.  RTC 3 months  20 minutes spent dedicated to the care of this patient on the date of this encounter to include pre-visit review of records, ordering of medication, post visit documentation, and  face-to-face time with the patient discussing depression and anxiety. Discussed continuing current medication regimen.  Patient advised to contact office with any questions, adverse effects, or acute worsening in  signs and symptoms.   Discussed potential benefits, risk, and side effects of benzodiazepines to include potential risk of tolerance and dependence, as well as possible drowsiness. Advised patient not to drive if experiencing drowsiness and to take lowest possible effective dose to minimize risk of dependence and tolerance.   Counseled patient regarding potential benefits, risks, and side effects of Lamictal  to include potential risk of Stevens-Johnson syndrome. Advised patient to stop taking Lamictal  and contact office immediately if rash develops and to seek urgent medical attention if rash is severe and/or spreading quickly.   Diagnoses and all orders for this visit:  Major depressive disorder, recurrent episode, moderate (HCC) -     lamoTRIgine  (LAMICTAL ) 150 MG tablet; Take 1 tablet (150 mg total) by mouth 2 (two) times daily. -     QUEtiapine  Fumarate 150 MG TABS; Take 1 tablet by mouth at bedtime.  Generalized anxiety disorder -     ALPRAZolam  (XANAX ) 0.5 MG tablet; Take one tablet four times daily.     Please see After Visit Summary for patient specific instructions.  No future appointments.   No orders of the defined types were placed in this encounter.     -------------------------------

## 2024-08-20 DIAGNOSIS — R2689 Other abnormalities of gait and mobility: Secondary | ICD-10-CM | POA: Diagnosis not present

## 2024-08-20 DIAGNOSIS — M545 Low back pain, unspecified: Secondary | ICD-10-CM | POA: Diagnosis not present

## 2024-08-25 DIAGNOSIS — M545 Low back pain, unspecified: Secondary | ICD-10-CM | POA: Diagnosis not present

## 2024-08-25 DIAGNOSIS — R2689 Other abnormalities of gait and mobility: Secondary | ICD-10-CM | POA: Diagnosis not present

## 2024-08-27 DIAGNOSIS — R2689 Other abnormalities of gait and mobility: Secondary | ICD-10-CM | POA: Diagnosis not present

## 2024-08-27 DIAGNOSIS — M545 Low back pain, unspecified: Secondary | ICD-10-CM | POA: Diagnosis not present

## 2024-09-01 DIAGNOSIS — M545 Low back pain, unspecified: Secondary | ICD-10-CM | POA: Diagnosis not present

## 2024-09-01 DIAGNOSIS — R2689 Other abnormalities of gait and mobility: Secondary | ICD-10-CM | POA: Diagnosis not present

## 2024-09-03 DIAGNOSIS — R2689 Other abnormalities of gait and mobility: Secondary | ICD-10-CM | POA: Diagnosis not present

## 2024-09-03 DIAGNOSIS — M545 Low back pain, unspecified: Secondary | ICD-10-CM | POA: Diagnosis not present

## 2024-09-08 DIAGNOSIS — R2689 Other abnormalities of gait and mobility: Secondary | ICD-10-CM | POA: Diagnosis not present

## 2024-09-08 DIAGNOSIS — M545 Low back pain, unspecified: Secondary | ICD-10-CM | POA: Diagnosis not present

## 2024-09-10 DIAGNOSIS — R2689 Other abnormalities of gait and mobility: Secondary | ICD-10-CM | POA: Diagnosis not present

## 2024-09-10 DIAGNOSIS — M545 Low back pain, unspecified: Secondary | ICD-10-CM | POA: Diagnosis not present

## 2024-09-15 DIAGNOSIS — R2689 Other abnormalities of gait and mobility: Secondary | ICD-10-CM | POA: Diagnosis not present

## 2024-09-15 DIAGNOSIS — M545 Low back pain, unspecified: Secondary | ICD-10-CM | POA: Diagnosis not present

## 2024-09-17 DIAGNOSIS — R2689 Other abnormalities of gait and mobility: Secondary | ICD-10-CM | POA: Diagnosis not present

## 2024-09-17 DIAGNOSIS — M545 Low back pain, unspecified: Secondary | ICD-10-CM | POA: Diagnosis not present

## 2024-09-22 DIAGNOSIS — M545 Low back pain, unspecified: Secondary | ICD-10-CM | POA: Diagnosis not present

## 2024-09-22 DIAGNOSIS — R2689 Other abnormalities of gait and mobility: Secondary | ICD-10-CM | POA: Diagnosis not present

## 2024-09-24 DIAGNOSIS — M545 Low back pain, unspecified: Secondary | ICD-10-CM | POA: Diagnosis not present

## 2024-09-24 DIAGNOSIS — R2689 Other abnormalities of gait and mobility: Secondary | ICD-10-CM | POA: Diagnosis not present

## 2024-09-29 DIAGNOSIS — R2689 Other abnormalities of gait and mobility: Secondary | ICD-10-CM | POA: Diagnosis not present

## 2024-09-29 DIAGNOSIS — M545 Low back pain, unspecified: Secondary | ICD-10-CM | POA: Diagnosis not present

## 2024-10-01 DIAGNOSIS — R2689 Other abnormalities of gait and mobility: Secondary | ICD-10-CM | POA: Diagnosis not present

## 2024-10-01 DIAGNOSIS — M545 Low back pain, unspecified: Secondary | ICD-10-CM | POA: Diagnosis not present

## 2024-10-06 DIAGNOSIS — Z1211 Encounter for screening for malignant neoplasm of colon: Secondary | ICD-10-CM | POA: Diagnosis not present

## 2024-10-06 DIAGNOSIS — R2689 Other abnormalities of gait and mobility: Secondary | ICD-10-CM | POA: Diagnosis not present

## 2024-10-06 DIAGNOSIS — M545 Low back pain, unspecified: Secondary | ICD-10-CM | POA: Diagnosis not present

## 2024-10-06 DIAGNOSIS — Z1212 Encounter for screening for malignant neoplasm of rectum: Secondary | ICD-10-CM | POA: Diagnosis not present

## 2024-10-08 DIAGNOSIS — R2689 Other abnormalities of gait and mobility: Secondary | ICD-10-CM | POA: Diagnosis not present

## 2024-10-08 DIAGNOSIS — M545 Low back pain, unspecified: Secondary | ICD-10-CM | POA: Diagnosis not present

## 2024-10-14 LAB — COLOGUARD: COLOGUARD: NEGATIVE

## 2024-10-15 DIAGNOSIS — R2689 Other abnormalities of gait and mobility: Secondary | ICD-10-CM | POA: Diagnosis not present

## 2024-10-15 DIAGNOSIS — M545 Low back pain, unspecified: Secondary | ICD-10-CM | POA: Diagnosis not present

## 2024-10-19 DIAGNOSIS — M545 Low back pain, unspecified: Secondary | ICD-10-CM | POA: Diagnosis not present

## 2024-10-19 DIAGNOSIS — R2689 Other abnormalities of gait and mobility: Secondary | ICD-10-CM | POA: Diagnosis not present

## 2024-10-26 ENCOUNTER — Other Ambulatory Visit: Payer: Self-pay | Admitting: Medical Genetics

## 2024-10-26 DIAGNOSIS — R2689 Other abnormalities of gait and mobility: Secondary | ICD-10-CM | POA: Diagnosis not present

## 2024-10-26 DIAGNOSIS — M545 Low back pain, unspecified: Secondary | ICD-10-CM | POA: Diagnosis not present

## 2024-10-26 DIAGNOSIS — Z006 Encounter for examination for normal comparison and control in clinical research program: Secondary | ICD-10-CM

## 2024-11-02 DIAGNOSIS — R2689 Other abnormalities of gait and mobility: Secondary | ICD-10-CM | POA: Diagnosis not present

## 2024-11-02 DIAGNOSIS — M545 Low back pain, unspecified: Secondary | ICD-10-CM | POA: Diagnosis not present

## 2024-11-05 DIAGNOSIS — R2689 Other abnormalities of gait and mobility: Secondary | ICD-10-CM | POA: Diagnosis not present

## 2024-11-05 DIAGNOSIS — M545 Low back pain, unspecified: Secondary | ICD-10-CM | POA: Diagnosis not present

## 2024-11-11 DIAGNOSIS — M545 Low back pain, unspecified: Secondary | ICD-10-CM | POA: Diagnosis not present

## 2024-11-11 DIAGNOSIS — R2689 Other abnormalities of gait and mobility: Secondary | ICD-10-CM | POA: Diagnosis not present

## 2024-11-20 ENCOUNTER — Telehealth: Admitting: Adult Health

## 2024-11-20 DIAGNOSIS — R2689 Other abnormalities of gait and mobility: Secondary | ICD-10-CM | POA: Diagnosis not present

## 2024-11-20 DIAGNOSIS — M545 Low back pain, unspecified: Secondary | ICD-10-CM | POA: Diagnosis not present

## 2024-11-24 DIAGNOSIS — M545 Low back pain, unspecified: Secondary | ICD-10-CM | POA: Diagnosis not present

## 2024-11-24 DIAGNOSIS — R2689 Other abnormalities of gait and mobility: Secondary | ICD-10-CM | POA: Diagnosis not present

## 2024-11-26 DIAGNOSIS — S32009K Unspecified fracture of unspecified lumbar vertebra, subsequent encounter for fracture with nonunion: Secondary | ICD-10-CM | POA: Diagnosis not present

## 2024-11-27 DIAGNOSIS — R2689 Other abnormalities of gait and mobility: Secondary | ICD-10-CM | POA: Diagnosis not present

## 2024-11-27 DIAGNOSIS — M545 Low back pain, unspecified: Secondary | ICD-10-CM | POA: Diagnosis not present

## 2024-11-28 ENCOUNTER — Other Ambulatory Visit: Payer: Self-pay | Admitting: Adult Health

## 2024-11-28 DIAGNOSIS — F411 Generalized anxiety disorder: Secondary | ICD-10-CM

## 2024-12-01 DIAGNOSIS — M545 Low back pain, unspecified: Secondary | ICD-10-CM | POA: Diagnosis not present

## 2024-12-01 DIAGNOSIS — R2689 Other abnormalities of gait and mobility: Secondary | ICD-10-CM | POA: Diagnosis not present

## 2024-12-02 ENCOUNTER — Encounter: Payer: Self-pay | Admitting: Adult Health

## 2024-12-02 ENCOUNTER — Ambulatory Visit: Admitting: Adult Health

## 2024-12-02 DIAGNOSIS — F331 Major depressive disorder, recurrent, moderate: Secondary | ICD-10-CM | POA: Diagnosis not present

## 2024-12-02 DIAGNOSIS — F411 Generalized anxiety disorder: Secondary | ICD-10-CM

## 2024-12-02 MED ORDER — LAMOTRIGINE 150 MG PO TABS: 150.0000 mg | ORAL_TABLET | Freq: Two times a day (BID) | ORAL | 2 refills | Status: AC

## 2024-12-02 MED ORDER — QUETIAPINE FUMARATE 150 MG PO TABS: 1.0000 | ORAL_TABLET | Freq: Every day | ORAL | 2 refills | Status: AC

## 2024-12-02 MED ORDER — ALPRAZOLAM 0.5 MG PO TABS: 0.5000 mg | ORAL_TABLET | Freq: Four times a day (QID) | ORAL | 2 refills | Status: AC

## 2024-12-02 NOTE — Progress Notes (Signed)
 Carol Duncan [id number] 1951/11/30 73 y.o.  Subjective:   Patient ID:  Carol Duncan is a 73 y.o. (DOB March 06, 1951) female.  Chief Complaint: No chief complaint on file.   HPI Carol Duncan presents to the office today for follow-up of MDD and GAD.  Describes mood today as ok. Pleasant. Denies tearfulness. Mood symptoms - reports depression, irritability and anxiety. Reports lower interest and motivation. Denies panic attacks. Reports some worry, rumination and over thinking. Reports obsessive thoughts, not acts. Reports ongoing situational stressors. Reports mood is a little higher lower. Stating I feel like I'm doing better. Reports pain limiting activity - working with pain management. Taking medications as prescribed.  Energy levels lower. Active, does not have a regular exercise routine with physical limitations. Enjoys some usual interests and activities. Divorced. Lives alone with dog. She has a new roommate. Spending time with family. Appetite adequate. Weight stable - 180 pounds.  Sleeping better some nights than others. Averages 8 hours. Denies daytime napping. Reports focus and concentration - ok. Completing minimal tasks. Managing aspects of household. Retired. Denies SI or HI.  Denies AH or VH. Denies self harm. Denies substance use.   Therapist - Jennifer Seals  Previous medication trials: Wellbutrin, Lithium ,others   Flowsheet Row Admission (Discharged) from 03/02/2024 in [facility] IR MCIR1/MCIR2/WLIR1 from 11/27/2023 in [facility]IOLOG[facility] ED to Hosp-Admission (Discharged) from 05/21/2022 in [facility]  SURGICAL  C-SSRS RISK CATEGORY No Risk No Risk No Risk     Review of Systems:  Review of Systems  Musculoskeletal:  Negative for gait problem.  Neurological:  Negative for tremors.  Psychiatric/Behavioral:         Please  refer to HPI    Medications: I have reviewed the patient's current medications.  Current Outpatient Medications  Medication Sig Dispense Refill   ALPRAZolam  (XANAX ) 0.5 MG tablet Take 1 tablet (0.5 mg total) by mouth 4 (four) times daily. 120 tablet 2   Biotin  10 MG TABS Take 10 mg by mouth daily.     Cholecalciferol  (VITAMIN D3) 125 MCG (5000 UT) TABS Take 5,000 Units by mouth daily.     EPINEPHrine  0.3 mg/0.3 mL IJ SOAJ injection Inject 0.3 mg into the muscle as needed for anaphylaxis. Call EMS if you use this. 2 each 1   estradiol  (ESTRACE ) 1 MG tablet Take 1 mg by mouth daily.     ferrous sulfate  325 (65 FE) MG tablet Take 325 mg by mouth daily.     HYDROcodone -acetaminophen  (NORCO) 10-325 MG tablet Take 1 tablet by mouth every 4 (four) hours as needed for severe pain (pain score 7-10) (takes one-half tablet). (Patient not taking: Reported on 02/21/2024) 40 tablet 0   lamoTRIgine  (LAMICTAL ) 150 MG tablet Take 1 tablet (150 mg total) by mouth 2 (two) times daily. 60 tablet 2   levothyroxine  (SYNTHROID ) 50 MCG tablet Take 50 mcg by mouth daily before breakfast.     methocarbamol  (ROBAXIN ) 500 MG tablet Take 1 tablet (500 mg total) by mouth every 6 (six) hours as needed for muscle spasms. 40 tablet 1   naproxen  (NAPROSYN ) 500 MG tablet Take 500 mg by mouth 2 (two) times daily as needed for mild pain.     oxyCODONE  10 MG TABS Take 1 tablet (10 mg total) by mouth every 3 (three) hours as needed for severe pain (pain score 7-10). 30 tablet 0   oxyCODONE -acetaminophen  (PERCOCET/ROXICET) 5-325 MG  tablet Take 1 tablet by mouth in the morning, at noon, in the evening, and at bedtime.     pregabalin  (LYRICA ) 150 MG capsule Take 150 mg by mouth 2 (two) times daily.     QUEtiapine  Fumarate 150 MG TABS Take 1 tablet by mouth at bedtime. 30 tablet 2   tiZANidine  (ZANAFLEX ) 4 MG tablet Take 1 tablet (4 mg total) by mouth every 6 (six) hours as needed for muscle spasms. (Patient taking differently: Take 4 mg  by mouth in the morning and at bedtime.) 30 tablet 0   TURMERIC CURCUMIN PO Take 1 capsule by mouth daily.     valACYclovir  (VALTREX ) 500 MG tablet Take 500 mg by mouth daily.     valsartan -hydrochlorothiazide  (DIOVAN -HCT) 80-12.5 MG tablet Take 1 tablet by mouth daily.     No current facility-administered medications for this visit.    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   Please see review of systems for further details on the patient's review from today.   Objective:   Physical Exam:  There were no vitals taken for this visit.  Physical Exam Constitutional:      General: She is not in acute distress. Musculoskeletal:        General: No deformity.  Neurological:     Mental Status: She is alert and oriented to person, place, and time.     Coordination: Coordination normal.  Psychiatric:        Attention and Perception: Attention and perception normal. She does not perceive auditory or visual hallucinations.        Mood and Affect: Mood normal. Mood is not anxious or depressed. Affect is not labile, blunt, angry or inappropriate.        Speech: Speech normal.        Behavior: Behavior normal.        Thought Content: Thought content normal. Thought content is not paranoid or delusional. Thought content does not include homicidal or suicidal ideation. Thought content does not include homicidal or suicidal plan.        Cognition and  Memory: Cognition and memory normal.        Judgment: Judgment normal.     Comments: Insight intact     Lab Review:     Component Value Date/Time   NA 141 02/11/2024 1449   K 5.2 02/11/2024 1449   CL 99 02/11/2024 1449   CO2 27 02/11/2024 1449   GLUCOSE 81 02/11/2024 1449   GLUCOSE 107 (H) 11/27/2023 0738   BUN 19 02/11/2024 1449   CREATININE 0.97 02/11/2024 1449   CALCIUM 11.0 (H) 02/11/2024 1449   PROT 7.7 02/11/2024 1449   ALBUMIN 4.7 02/11/2024 1449   AST 19 02/11/2024 1449   ALT 11 02/11/2024 1449   ALKPHOS 94 02/11/2024 1449   BILITOT <0.2 02/11/2024 1449   GFRNONAA 33 (L) 11/27/2023  9261   GFRAA >60 12/21/2017 0923       Component Value Date/Time   WBC 9.1 02/11/2024 1449   WBC 7.9 11/27/2023 0738   RBC 4.51 02/11/2024 1449   RBC 3.86 (L) 11/27/2023 0738   HGB 14.7 02/11/2024 1449   HCT 43.9 02/11/2024 1449   PLT 285 02/11/2024 1449   MCV 97 02/11/2024 1449   MCH 32.6 02/11/2024 1449   MCH 31.9 11/27/2023 0738   MCHC 33.5 02/11/2024 1449   MCHC 34.2 11/27/2023 0738   RDW 12.5 02/11/2024 1449   LYMPHSABS 2.2 02/11/2024 1449   MONOABS 0.4 09/24/2015 0858   EOSABS 0.1 02/11/2024 1449   BASOSABS 0.1 02/11/2024 1449    Lithium  Lvl  Date Value Ref Range Status  02/26/2016 <0.06 (L) 0.60 - 1.20 mmol/L Final     No results found for: PHENYTOIN, PHENOBARB, VALPROATE, CBMZ   .res Assessment: Plan:    Plan:  PDMP reviewed  Seroquel  150mg  at bedtime for sleep  Lamictal  150mg  BID  Xanax  1mg  twice daily.  RTC 3 months  25 minutes spent dedicated to the care of this patient on the date of this encounter to include pre-visit review of records, ordering of medication, post visit documentation, and face-to-face time with the patient discussing depression and anxiety. Discussed continuing current medication regimen.  Patient advised to contact office with any questions, adverse effects, or acute worsening in signs and symptoms.   Discussed  potential benefits, risk, and side effects of benzodiazepines to include potential risk of tolerance and dependence, as well as possible drowsiness. Advised patient not to drive if experiencing drowsiness and to take lowest possible effective dose to minimize risk of dependence and tolerance.   Counseled patient regarding potential benefits, risks, and side effects of Lamictal  to include potential risk of Stevens-Johnson syndrome. Advised patient to stop taking Lamictal  and contact office immediately if rash develops and to seek urgent medical attention if rash is severe and/or spreading quickly.  Diagnoses and all orders for this visit:  Major depressive disorder, recurrent episode, moderate (HCC) -     lamoTRIgine  (LAMICTAL ) 150 MG tablet; Take 1 tablet (150 mg total) by mouth 2 (two) times daily. -     QUEtiapine  Fumarate 150 MG TABS; Take 1 tablet by mouth at bedtime.  Generalized anxiety disorder -     ALPRAZolam  (XANAX ) 0.5 MG tablet; Take 1 tablet (0.5 mg total) by mouth 4 (four) times daily.     Please see After Visit Summary for patient specific instructions.  No future appointments.  No orders of the defined types were placed in this encounter.   -------------------------------     [1]  Allergies Allergen Reactions   Alpha-Gal Anaphylaxis   Shellfish Allergy     Shrimp ok,  Scallops, clams, oysters give angioedema  Other Reaction(s): Not available   Bovine (Beef) Protein-Containing Drug Products    Lambs Quarters    Porcine (Pork) Protein-Containing Drug Products

## 2024-12-07 ENCOUNTER — Other Ambulatory Visit: Payer: Self-pay | Admitting: Adult Health

## 2024-12-07 DIAGNOSIS — F331 Major depressive disorder, recurrent, moderate: Secondary | ICD-10-CM

## 2025-01-07 ENCOUNTER — Ambulatory Visit: Admitting: Orthopedic Surgery

## 2025-03-03 ENCOUNTER — Ambulatory Visit: Admitting: Adult Health
